# Patient Record
Sex: Male | Born: 1960
Health system: Southern US, Community
[De-identification: ages and names within clinical notes are randomized; demographics above are authoritative.]

## PROBLEM LIST (undated history)

## (undated) DIAGNOSIS — S46001A Unspecified injury of muscle(s) and tendon(s) of the rotator cuff of right shoulder, initial encounter: Secondary | ICD-10-CM

## (undated) DIAGNOSIS — R109 Unspecified abdominal pain: Secondary | ICD-10-CM

## (undated) DIAGNOSIS — M1990 Unspecified osteoarthritis, unspecified site: Secondary | ICD-10-CM

## (undated) DIAGNOSIS — B019 Varicella without complication: Secondary | ICD-10-CM

## (undated) DIAGNOSIS — J4520 Mild intermittent asthma, uncomplicated: Secondary | ICD-10-CM

## (undated) DIAGNOSIS — B269 Mumps without complication: Secondary | ICD-10-CM

## (undated) DIAGNOSIS — E109 Type 1 diabetes mellitus without complications: Secondary | ICD-10-CM

## (undated) DIAGNOSIS — M25569 Pain in unspecified knee: Secondary | ICD-10-CM

## (undated) DIAGNOSIS — I10 Essential (primary) hypertension: Secondary | ICD-10-CM

## (undated) DIAGNOSIS — K5900 Constipation, unspecified: Secondary | ICD-10-CM

## (undated) DIAGNOSIS — E785 Hyperlipidemia, unspecified: Secondary | ICD-10-CM

## (undated) DIAGNOSIS — Z0000 Encounter for general adult medical examination without abnormal findings: Secondary | ICD-10-CM

## (undated) DIAGNOSIS — L578 Other skin changes due to chronic exposure to nonionizing radiation: Secondary | ICD-10-CM

## (undated) DIAGNOSIS — D649 Anemia, unspecified: Secondary | ICD-10-CM

## (undated) DIAGNOSIS — E1039 Type 1 diabetes mellitus with other diabetic ophthalmic complication: Secondary | ICD-10-CM

## (undated) HISTORY — DX: Type 1 diabetes mellitus without complications: E10.9

## (undated) HISTORY — DX: Pain in unspecified knee: M25.569

## (undated) HISTORY — DX: Unspecified abdominal pain: R10.9

## (undated) HISTORY — DX: Hyperlipidemia, unspecified: E78.5

## (undated) HISTORY — DX: Encounter for general adult medical examination without abnormal findings: Z00.00

## (undated) HISTORY — DX: Essential (primary) hypertension: I10

## (undated) HISTORY — DX: Constipation, unspecified: K59.00

## (undated) HISTORY — DX: Unspecified injury of muscle(s) and tendon(s) of the rotator cuff of right shoulder, initial encounter: S46.001A

## (undated) HISTORY — DX: Mild intermittent asthma, uncomplicated: J45.20

## (undated) HISTORY — DX: Type 1 diabetes mellitus with other diabetic ophthalmic complication: E10.39

## (undated) HISTORY — DX: Mumps without complication: B26.9

## (undated) HISTORY — DX: Other skin changes due to chronic exposure to nonionizing radiation: L57.8

## (undated) HISTORY — DX: Anemia, unspecified: D64.9

## (undated) HISTORY — DX: Varicella without complication: B01.9

## (undated) HISTORY — DX: Unspecified osteoarthritis, unspecified site: M19.90

## (undated) HISTORY — PX: OTHER SURGICAL HISTORY: SHX169

---

## 1999-04-12 ENCOUNTER — Encounter: Payer: Self-pay | Admitting: Emergency Medicine

## 1999-04-12 ENCOUNTER — Emergency Department (HOSPITAL_COMMUNITY): Admission: EM | Admit: 1999-04-12 | Discharge: 1999-04-12 | Payer: Self-pay | Admitting: Emergency Medicine

## 1999-10-19 ENCOUNTER — Emergency Department (HOSPITAL_COMMUNITY): Admission: EM | Admit: 1999-10-19 | Discharge: 1999-10-20 | Payer: Self-pay | Admitting: *Deleted

## 1999-10-26 ENCOUNTER — Emergency Department (HOSPITAL_COMMUNITY): Admission: EM | Admit: 1999-10-26 | Discharge: 1999-10-26 | Payer: Self-pay | Admitting: Emergency Medicine

## 1999-12-17 ENCOUNTER — Emergency Department (HOSPITAL_COMMUNITY): Admission: EM | Admit: 1999-12-17 | Discharge: 1999-12-17 | Payer: Self-pay | Admitting: Emergency Medicine

## 2000-07-18 ENCOUNTER — Emergency Department (HOSPITAL_COMMUNITY): Admission: EM | Admit: 2000-07-18 | Discharge: 2000-07-18 | Payer: Self-pay | Admitting: *Deleted

## 2000-08-31 ENCOUNTER — Emergency Department (HOSPITAL_COMMUNITY): Admission: EM | Admit: 2000-08-31 | Discharge: 2000-08-31 | Payer: Self-pay | Admitting: Emergency Medicine

## 2000-10-23 ENCOUNTER — Emergency Department (HOSPITAL_COMMUNITY): Admission: EM | Admit: 2000-10-23 | Discharge: 2000-10-23 | Payer: Self-pay | Admitting: *Deleted

## 2001-03-25 ENCOUNTER — Emergency Department (HOSPITAL_COMMUNITY): Admission: EM | Admit: 2001-03-25 | Discharge: 2001-03-25 | Payer: Self-pay | Admitting: Emergency Medicine

## 2002-04-06 HISTORY — PX: ROTATOR CUFF REPAIR: SHX139

## 2002-07-04 ENCOUNTER — Encounter: Payer: Self-pay | Admitting: Emergency Medicine

## 2002-07-04 ENCOUNTER — Emergency Department (HOSPITAL_COMMUNITY): Admission: EM | Admit: 2002-07-04 | Discharge: 2002-07-04 | Payer: Self-pay | Admitting: Emergency Medicine

## 2003-06-29 ENCOUNTER — Emergency Department (HOSPITAL_COMMUNITY): Admission: EM | Admit: 2003-06-29 | Discharge: 2003-06-29 | Payer: Self-pay | Admitting: *Deleted

## 2005-04-06 HISTORY — PX: REFRACTIVE SURGERY: SHX103

## 2005-06-10 ENCOUNTER — Emergency Department (HOSPITAL_COMMUNITY): Admission: EM | Admit: 2005-06-10 | Discharge: 2005-06-10 | Payer: Self-pay | Admitting: Emergency Medicine

## 2006-06-14 ENCOUNTER — Emergency Department (HOSPITAL_COMMUNITY): Admission: EM | Admit: 2006-06-14 | Discharge: 2006-06-15 | Payer: Self-pay | Admitting: Emergency Medicine

## 2008-07-02 ENCOUNTER — Ambulatory Visit: Payer: Self-pay | Admitting: Internal Medicine

## 2008-07-02 DIAGNOSIS — E785 Hyperlipidemia, unspecified: Secondary | ICD-10-CM

## 2008-07-02 DIAGNOSIS — I10 Essential (primary) hypertension: Secondary | ICD-10-CM

## 2008-07-02 DIAGNOSIS — J309 Allergic rhinitis, unspecified: Secondary | ICD-10-CM | POA: Insufficient documentation

## 2008-09-10 ENCOUNTER — Encounter: Payer: Self-pay | Admitting: Internal Medicine

## 2008-09-10 LAB — CONVERTED CEMR LAB
ALT: 24 units/L
AST: 19 units/L
Creatinine, Ser: 0.9 mg/dL
Direct LDL: 71 mg/dL
Glucose, Urine, Semiquant: 64
HDL: 73 mg/dL
Hgb A1c MFr Bld: 6.7 %
Triglyceride fasting, serum: 51 mg/dL

## 2008-09-17 ENCOUNTER — Ambulatory Visit: Payer: Self-pay | Admitting: Internal Medicine

## 2008-09-17 DIAGNOSIS — F4322 Adjustment disorder with anxiety: Secondary | ICD-10-CM | POA: Insufficient documentation

## 2008-09-17 DIAGNOSIS — F418 Other specified anxiety disorders: Secondary | ICD-10-CM | POA: Insufficient documentation

## 2008-10-01 ENCOUNTER — Ambulatory Visit: Payer: Self-pay | Admitting: Internal Medicine

## 2008-10-01 DIAGNOSIS — L259 Unspecified contact dermatitis, unspecified cause: Secondary | ICD-10-CM

## 2008-10-29 ENCOUNTER — Ambulatory Visit: Payer: Self-pay | Admitting: Internal Medicine

## 2009-01-07 ENCOUNTER — Encounter: Payer: Self-pay | Admitting: Internal Medicine

## 2009-02-25 ENCOUNTER — Ambulatory Visit: Payer: Self-pay | Admitting: Internal Medicine

## 2009-05-06 ENCOUNTER — Encounter: Payer: Self-pay | Admitting: Internal Medicine

## 2009-08-12 ENCOUNTER — Encounter: Payer: Self-pay | Admitting: Internal Medicine

## 2009-11-25 ENCOUNTER — Encounter: Payer: Self-pay | Admitting: Internal Medicine

## 2009-12-26 ENCOUNTER — Inpatient Hospital Stay (HOSPITAL_COMMUNITY): Admission: EM | Admit: 2009-12-26 | Discharge: 2009-12-27 | Payer: Self-pay | Admitting: Emergency Medicine

## 2009-12-26 ENCOUNTER — Encounter (INDEPENDENT_AMBULATORY_CARE_PROVIDER_SITE_OTHER): Payer: Self-pay

## 2009-12-31 ENCOUNTER — Ambulatory Visit: Payer: Self-pay | Admitting: Internal Medicine

## 2009-12-31 DIAGNOSIS — K3580 Unspecified acute appendicitis: Secondary | ICD-10-CM | POA: Insufficient documentation

## 2010-02-24 ENCOUNTER — Encounter: Payer: Self-pay | Admitting: Internal Medicine

## 2010-04-16 ENCOUNTER — Encounter (INDEPENDENT_AMBULATORY_CARE_PROVIDER_SITE_OTHER): Payer: Self-pay | Admitting: *Deleted

## 2010-04-16 ENCOUNTER — Ambulatory Visit
Admission: RE | Admit: 2010-04-16 | Discharge: 2010-04-16 | Payer: Self-pay | Source: Home / Self Care | Attending: Family Medicine | Admitting: Family Medicine

## 2010-04-16 DIAGNOSIS — J069 Acute upper respiratory infection, unspecified: Secondary | ICD-10-CM | POA: Insufficient documentation

## 2010-05-06 NOTE — Assessment & Plan Note (Signed)
Summary: hospital fu appendicitis surgery/dt   Vital Signs:  Patient profile:   50 year old male Height:      67.5 inches Weight:      165 pounds BMI:     25.55 O2 Sat:      99 % on Room air Temp:     98.0 degrees F oral Pulse rate:   69 / minute Pulse rhythm:   regular Resp:     18 per minute BP sitting:   122 / 80  (right arm) Cuff size:   large  Vitals Entered By: Glendell Docker CMA (December 31, 2009 2:42 PM)  O2 Flow:  Room air CC: Hospital Follow up  Is Patient Diabetic? Yes Did you bring your meter with you today? Yes Pain Assessment Patient in pain? no      Comments Hospitalized last week Thursday and discharged on Friday, for appendicitis. Request evaluation of incision site, patient denies pain   Primary Care Provider:  D. Thomos Lemons DO  CC:  Hospital Follow up .  History of Present Illness: 50 y/o white male with hx of DM I for hosp f/u pt recently admitted for right appy appy performed laporascopically  eating and drinking back to normal  blood sugars have been higher using higher basal rate  no fever or chills   Preventive Screening-Counseling & Management  Alcohol-Tobacco     Smoking Status: never  Allergies: 1)  ! Codeine  Past History:  Past Medical History: Diabetes mellitus, type I Hyperlipidemia   Hypertension    Past Surgical History: Laser eye surgery 2007 (Eye center at [facility])     Social History: Occupation: Time Physiological scientist (service tech) Married 14 years 2 daughters age 73 & 48  1 son 42 Never Smoked  Alcohol use-yes (social)     Physical Exam  General:  alert, well-developed, and well-nourished.   Lungs:  normal respiratory effort and normal breath sounds.   Heart:  normal rate, regular rhythm, and no gallop.   Abdomen:  soft, non-tender, and normal bowel sounds.  dressings CDI Extremities:  No lower extremity edema  Neurologic:  cranial nerves II-XII intact and gait normal.     Impression &  Recommendations:  Problem # 1:  ACUTE APPENDICITIS WITHOUT MENTION PERITONITIS (ICD-540.9) s/p lap appy no complications f/u with surgeon as directed  Problem # 2:  HYPERTENSION (ICD-401.9) Assessment: Unchanged  His updated medication list for this problem includes:    Quinapril Hcl 40 Mg Tabs (Quinapril hcl) .Marland Kitchen... Take 1 tablet by mouth once a day  BP today: 122/80 Prior BP: 110/70 (10/29/2008)  Labs Reviewed: Creat: : 0.9 (09/10/2008)   HDL: 73 (09/10/2008)   TG: 51 (09/10/2008)  Problem # 3:  DIABETES MELLITUS, TYPE I (ICD-250.01) Insulin pump mgt as per endocrinologist  His updated medication list for this problem includes:    Quinapril Hcl 40 Mg Tabs (Quinapril hcl) .Marland Kitchen... Take 1 tablet by mouth once a day  Labs Reviewed: Creat: 0.9 (09/10/2008)    Reviewed HgBA1c results: 6.7 (09/10/2008)  Complete Medication List: 1)  Crestor 40 Mg Tabs (Rosuvastatin calcium) .... Take 1 tablet by mouth once a day 2)  Quinapril Hcl 40 Mg Tabs (Quinapril hcl) .... Take 1 tablet by mouth once a day 3)  Onetouch Ping Insulin Pump Kit (Insulin infusion pump) .... Use 60 units once daily  Patient Instructions: 1)  Please schedule a follow-up appointment in 1 year.  Current Allergies (reviewed today): ! CODEINE   Immunization  History:  Influenza Immunization History:    Influenza:  historical (12/27/2009)

## 2010-05-06 NOTE — Letter (Signed)
Summary: [facility] Endocrinology & Diabetes  [facility] Endocrinology & Diabetes   Imported By: Lanelle Bal 12/06/2009 10:26:57  _____________________________________________________________________  External Attachment:    Type:   Image     Comment:   External Document

## 2010-05-06 NOTE — Letter (Signed)
Summary: [facility] Endocrinology & Diabetes  [facility] Endocrinology & Diabetes   Imported By: Lanelle Bal 03/13/2010 16:13:19  _____________________________________________________________________  External Attachment:    Type:   Image     Comment:   External Document

## 2010-05-06 NOTE — Letter (Signed)
Summary: [facility] Endocrinology & Diabetes  [facility] Endocrinology & Diabetes   Imported By: Lanelle Bal 08/21/2009 13:16:03  _____________________________________________________________________  External Attachment:    Type:   Image     Comment:   External Document

## 2010-05-06 NOTE — Letter (Signed)
Summary: [facility] Endocrinology & Diabetes  [facility] Endocrinology & Diabetes   Imported By: Lanelle Bal 05/13/2009 12:47:14  _____________________________________________________________________  External Attachment:    Type:   Image     Comment:   External Document

## 2010-05-08 NOTE — Letter (Signed)
Summary: Out of Work 04/15/10-04/17/10  [place] [facility] [address]   [place], [place] [address]   Phone: [phone]  Fax: [phone]    April 16, 2010   Employee:  KRISHAY FARO    To Whom It May Concern:   For Medical reasons, please excuse the above named employee from work for the following dates:  Start:     April 15, 2010  End:     April 17, 2010  Return to Work:   April 18, 2010  If you need additional information, please feel free to contact our office.       Sincerely,    Francee Piccolo CMA (AAMA)

## 2010-05-08 NOTE — Assessment & Plan Note (Signed)
Summary: SINUS INFECTION/MHF OK PER DIANE   Vital Signs:  Patient profile:   50 year old male Height:      67.5 inches Weight:      173 pounds BMI:     26.79 O2 Sat:      99 % on Room air Temp:     98.1 degrees F oral Pulse rate:   86 / minute BP sitting:   156 / 89  (right arm) Cuff size:   regular  Vitals Entered By: Francee Piccolo CMA Duncan Dull) (April 16, 2010 10:22 AM)  O2 Flow:  Room air CC: sinus pressure, clear nasal discharge, bilateral ear aches since yesterday.  Also states roof of mouth aches, denies headache, URI symptoms Is Patient Diabetic? Yes Did you bring your meter with you today? Yes   History of Present Illness:       This is a 50 year old man who presents with URI symptoms that started about 24-36 hours ago.  The patient complains of nasal congestion, clear nasal discharge with mild PND coughing, and earache, but denies sore throat or body aches.  Describes sinus pressure.  The patient denies fever, stiff neck, dyspnea, and wheezing.  No rash, no vomiting, no fever.   His glucoses have been up a little over his normal and he is titrating his insulin pump accordingly. He has had his seasonal flu vaccine this year.  Current Medications (verified): 1)  Crestor 40 Mg Tabs (Rosuvastatin Calcium) .... Take 1 Tablet By Mouth Once A Day 2)  Quinapril Hcl 40 Mg Tabs (Quinapril Hcl) .... Take 1 Tablet By Mouth Once A Day 3)  Onetouch Ping Insulin Pump  Kit (Insulin Infusion Pump) .... Use 60 Units Once Daily 4)  Insulin Pump .... Use As Directed  Allergies (verified): No Known Drug Allergies PMH-FH-SH reviewed-no changes except otherwise noted  Review of Systems       see hpi  Physical Exam  General:  Gen: alert, NAD, NONTOXIC. HEENT: eyes without injection, drainage, or swelling.  Ears: EACs clear, TMs with normal light reflex and landmarks.  Nose: some dried, crusty exudate adherent to some mildly injected mucosa.  No purulent d/c.  No paranasal sinus  TTP.  No facial swelling.  Throat and mouth without focal lesion.  No pharyngial swelling, erythema, or exudate.   Neck: supple, no LAD.  LUNGS: CTA bilat, nonlabored resps.  CV: RRR, no m/r/g.     Impression & Recommendations:  Problem # 1:  VIRAL URI (ICD-465.9) Assessment New Discussed self limited nature of illness, no need for abx, focus on symptom management. He describes no response to nonsedating antihistamines in the past, no response to the sudafed he took yesterday (except insomnia and possible bp elevation...).  His worst symptom is sinus pressure/ear pressure with nasal/head congestion, so we ultimately decided on a trial of hydrocodone/antihist + tylenol OTC as needed, push fluids, rest, and I gave him a note to stay out of work again tomorrow.  His updated medication list for this problem includes:    Hydrocodone-homatropine 5-1.5 Mg Tabs (Hydrocodone-homatropine) .Marland Kitchen... 1 tsp by mouth q6h as needed for sinus pressure and congestion  Complete Medication List: 1)  Crestor 40 Mg Tabs (Rosuvastatin calcium) .... Take 1 tablet by mouth once a day 2)  Quinapril Hcl 40 Mg Tabs (Quinapril hcl) .... Take 1 tablet by mouth once a day 3)  Onetouch Ping Insulin Pump Kit (Insulin infusion pump) .... Use 60 units once daily 4)  Insulin Pump  .Marland KitchenMarland KitchenMarland Kitchen  Use as directed 5)  Hydrocodone-homatropine 5-1.5 Mg Tabs (Hydrocodone-homatropine) .Marland Kitchen.. 1 tsp by mouth q6h as needed for sinus pressure and congestion  Patient Instructions: 1)  Call or return if your cold symptoms are not improving after 7-10 days.   Prescriptions: HYDROCODONE-HOMATROPINE 5-1.5 MG TABS (HYDROCODONE-HOMATROPINE) 1 tsp by mouth q6h as needed for sinus pressure and congestion  #4 oz x 0   Entered and Authorized by:   Michell Heinrich M.D.   Signed by:   Michell Heinrich M.D. on 04/16/2010   Method used:   Print then Give to Patient   RxID:   [id number]    Orders Added: 1)  Est. Patient Level III  [14782]

## 2010-05-26 ENCOUNTER — Encounter: Payer: Self-pay | Admitting: Internal Medicine

## 2010-06-03 NOTE — Letter (Signed)
Summary: [facility] Endocrinology & Diabetes  [facility] Endocrinology & Diabetes   Imported By: Maryln Gottron 05/29/2010 15:53:40  _____________________________________________________________________  External Attachment:    Type:   Image     Comment:   External Document

## 2010-06-19 LAB — URINALYSIS, ROUTINE W REFLEX MICROSCOPIC
Ketones, ur: NEGATIVE mg/dL
Leukocytes, UA: NEGATIVE
Nitrite: NEGATIVE
Protein, ur: NEGATIVE mg/dL

## 2010-06-19 LAB — GLUCOSE, CAPILLARY
Glucose-Capillary: 223 mg/dL — ABNORMAL HIGH (ref 70–99)
Glucose-Capillary: 254 mg/dL — ABNORMAL HIGH (ref 70–99)
Glucose-Capillary: 284 mg/dL — ABNORMAL HIGH (ref 70–99)
Glucose-Capillary: 297 mg/dL — ABNORMAL HIGH (ref 70–99)

## 2010-06-19 LAB — COMPREHENSIVE METABOLIC PANEL
ALT: 27 U/L (ref 0–53)
Alkaline Phosphatase: 63 U/L (ref 39–117)
BUN: 19 mg/dL (ref 6–23)
CO2: 30 mEq/L (ref 19–32)
Calcium: 9.2 mg/dL (ref 8.4–10.5)
GFR calc non Af Amer: >60 mL/min (ref >60)
Glucose, Bld: 263 mg/dL — ABNORMAL HIGH (ref 70–99)
Potassium: 4.8 mEq/L (ref 3.5–5.1)
Sodium: 136 mEq/L (ref 135–145)
Total Protein: 6.9 g/dL (ref 6.0–8.3)

## 2010-06-19 LAB — CBC
HCT: 42.8 % (ref 39.0–52.0)
Hemoglobin: 14 g/dL (ref 13.0–17.0)
MCH: 29.5 pg (ref 26.0–34.0)
MCHC: 32.7 g/dL (ref 30.0–36.0)
MCHC: 33.1 g/dL (ref 30.0–36.0)
MCV: 89.2 fL (ref 78.0–100.0)
MCV: 90.5 fL (ref 78.0–100.0)
Platelets: 150 10*3/uL (ref 150–400)
RDW: 11.5 % (ref 11.5–15.5)
RDW: 11.7 % (ref 11.5–15.5)

## 2010-06-19 LAB — DIFFERENTIAL
Basophils Relative: 0 % (ref 0–1)
Eosinophils Absolute: 0.1 10*3/uL (ref 0.0–0.7)
Monocytes Relative: 6 % (ref 3–12)
Neutro Abs: 9 10*3/uL — ABNORMAL HIGH (ref 1.7–7.7)
Neutrophils Relative %: 80 % — ABNORMAL HIGH (ref 43–77)

## 2010-06-19 LAB — URINE MICROSCOPIC-ADD ON

## 2011-11-13 ENCOUNTER — Ambulatory Visit (INDEPENDENT_AMBULATORY_CARE_PROVIDER_SITE_OTHER): Payer: Managed Care, Other (non HMO) | Admitting: Internal Medicine

## 2011-11-13 ENCOUNTER — Encounter: Payer: Self-pay | Admitting: Internal Medicine

## 2011-11-13 VITALS — BP 128/68 | HR 66 | Temp 98.4°F | Resp 16 | Ht 68.0 in | Wt 167.0 lb

## 2011-11-13 DIAGNOSIS — E785 Hyperlipidemia, unspecified: Secondary | ICD-10-CM

## 2011-11-13 DIAGNOSIS — Z1211 Encounter for screening for malignant neoplasm of colon: Secondary | ICD-10-CM

## 2011-11-13 MED ORDER — ATORVASTATIN CALCIUM 40 MG PO TABS: 40.0000 mg | ORAL_TABLET | Freq: Every day | ORAL | Status: DC

## 2011-11-13 NOTE — Patient Instructions (Signed)
Please schedule fasting labs in 4-6 weeks Lipid/lft-272.4 and PSA(prostate cancer screening)

## 2011-11-14 DIAGNOSIS — Z1211 Encounter for screening for malignant neoplasm of colon: Secondary | ICD-10-CM | POA: Insufficient documentation

## 2011-11-14 NOTE — Assessment & Plan Note (Signed)
Refer for screening colonoscopy 

## 2011-11-14 NOTE — Progress Notes (Signed)
  Subjective:    Patient ID: Scott Adkins, male    DOB: 18-Aug-1960, 51 y.o.   MRN: [id number]  HPI Pt presents to clinic for followup of multiple medical problems. H/o DM type maintained on insulin pump followed by endocrinology. H/o hyperlipidemia but has had to stop crestor due to cost. Tolerated statin therapy without myalgia or abn lft. Has not undergone screening colonoscopy.  Past Medical History  Diagnosis Date  . Diabetes mellitus type I   . Hyperlipidemia   . Hypertension    Past Surgical History  Procedure Date  . Refractive surgery 2007    [facility]    reports that he has never smoked. He does not have any smokeless tobacco history on file. He reports that he drinks alcohol. His drug history not on file. family history includes Hypertension in an unspecified family member and Stroke in an unspecified family member.  There is no history of Colon cancer, and Prostate cancer, and Breast cancer, . Allergies  Allergen Reactions  . Codeine     REACTION: Heart Races      Review of Systems see hpi     Objective:   Physical Exam  Physical Exam  Nursing note and vitals reviewed. Constitutional: Appears well-developed and well-nourished. No distress.  HENT:  Head: Normocephalic and atraumatic.  Right Ear: External ear normal.  Left Ear: External ear normal.  Eyes: Conjunctivae are normal. No scleral icterus.  Neck: Neck supple. Carotid bruit is not present.  Cardiovascular: Normal rate, regular rhythm and normal heart sounds.  Exam reveals no gallop and no friction rub.   No murmur heard. Pulmonary/Chest: Effort normal and breath sounds normal. No respiratory distress. He has no wheezes. no rales.  Lymphadenopathy:    He has no cervical adenopathy.  Neurological:Alert.  Skin: Skin is warm and dry. Not diaphoretic.  Psychiatric: Has a normal mood and affect.        Assessment & Plan:

## 2011-11-14 NOTE — Assessment & Plan Note (Signed)
Attempt lipitor 40mg  po qd and obtain lipid/lft in 4-6wks

## 2011-11-16 ENCOUNTER — Encounter: Payer: Self-pay | Admitting: Gastroenterology

## 2011-12-11 ENCOUNTER — Ambulatory Visit (AMBULATORY_SURGERY_CENTER): Payer: Managed Care, Other (non HMO) | Admitting: *Deleted

## 2011-12-11 ENCOUNTER — Telehealth: Payer: Self-pay | Admitting: *Deleted

## 2011-12-11 VITALS — Ht 67.5 in | Wt 172.0 lb

## 2011-12-11 DIAGNOSIS — Z1211 Encounter for screening for malignant neoplasm of colon: Secondary | ICD-10-CM

## 2011-12-11 MED ORDER — MOVIPREP 100 G PO SOLR: ORAL | Status: DC

## 2011-12-11 NOTE — Telephone Encounter (Signed)
Scott Adkins: Pt is insulin dependent diabetic on insulin pump.  Dr. Vale Haven Altheimer is pt's endocrinologist.  Office number is: [phone].  FAX is: [phone].  Please send letter to MD for instructions for insulin pump day before procedure and day of procedure.  Best number to reach pt with instructions is: [phone].  Colonoscopy scheduled for 9/20 at 9:30 with Dr. Juanda Chance. Thanks

## 2011-12-11 NOTE — Telephone Encounter (Signed)
Letter sent to Dr Altheimer. 

## 2011-12-14 LAB — LIPID PANEL
Cholesterol: 140 mg/dL (ref 0–200)
LDL Cholesterol: 72 mg/dL (ref 0–99)
Total CHOL/HDL Ratio: 2.4 Ratio
VLDL: 10 mg/dL (ref 0–40)

## 2011-12-14 LAB — PSA: PSA: 0.51 ng/mL (ref <=4.00)

## 2011-12-14 LAB — HEPATIC FUNCTION PANEL
ALT: 13 U/L (ref 0–53)
Bilirubin, Direct: 0.1 mg/dL (ref 0.0–0.3)
Indirect Bilirubin: 0.3 mg/dL (ref 0.0–0.9)
Total Bilirubin: 0.4 mg/dL (ref 0.3–1.2)

## 2011-12-14 NOTE — Addendum Note (Signed)
Addended by: Regis Bill on: 12/14/2011 09:21 AM   Modules accepted: Orders

## 2011-12-14 NOTE — Telephone Encounter (Signed)
Spoke with patient and gave him Dr Altheimer's recommendations regarding insulin pump for procedure. Patient verbalizes understanding. Please see 12/11/11 note under "media" that was sent back by Dr Altheimer regarding insulin pump.

## 2011-12-15 ENCOUNTER — Encounter: Payer: Self-pay | Admitting: Internal Medicine

## 2011-12-23 ENCOUNTER — Encounter: Payer: Managed Care, Other (non HMO) | Admitting: Gastroenterology

## 2011-12-25 ENCOUNTER — Ambulatory Visit (AMBULATORY_SURGERY_CENTER): Payer: Managed Care, Other (non HMO) | Admitting: Internal Medicine

## 2011-12-25 ENCOUNTER — Encounter: Payer: Managed Care, Other (non HMO) | Admitting: Gastroenterology

## 2011-12-25 ENCOUNTER — Encounter: Payer: Self-pay | Admitting: Internal Medicine

## 2011-12-25 VITALS — BP 138/79 | HR 66 | Temp 97.3°F | Resp 17 | Ht 67.0 in | Wt 172.0 lb

## 2011-12-25 DIAGNOSIS — K6389 Other specified diseases of intestine: Secondary | ICD-10-CM

## 2011-12-25 DIAGNOSIS — Z1211 Encounter for screening for malignant neoplasm of colon: Secondary | ICD-10-CM

## 2011-12-25 LAB — GLUCOSE, CAPILLARY: Glucose-Capillary: 186 mg/dL — ABNORMAL HIGH (ref 70–99)

## 2011-12-25 MED ORDER — SODIUM CHLORIDE 0.9 % IV SOLN: 500.0000 mL | INTRAVENOUS | Status: DC

## 2011-12-25 NOTE — Op Note (Signed)
[facility] [address] [place], [address]   COLONOSCOPY PROCEDURE REPORT  PATIENT: [place], Souter  MR#: [id number] BIRTHDATE: [birth year] , 51  yrs. old GENDER: Male ENDOSCOPIST: Hart Carwin, MD REFERRED BY:  Charlynn Court, M.D. PROCEDURE DATE:  12/25/2011 PROCEDURE:   Colonoscopy with biopsy ASA CLASS:   Class III INDICATIONS:average risk patient for colon cancer. MEDICATIONS: MAC sedation, administered by CRNA and Propofol (Diprivan) 200 mg IV  DESCRIPTION OF PROCEDURE:   After the risks and benefits and of the procedure were explained, informed consent was obtained.  A digital rectal exam revealed no abnormalities of the rectum.    The LB [id number]  endoscope was introduced through the anus and advanced to the cecum, which was identified by both the appendix and ileocecal valve .  The quality of the prep was good, using MoviPrep .  The instrument was then slowly withdrawn as the colon was fully examined.     COLON FINDINGS: Mild melanosis was found. biopsies taken - lymphoid follicles vs polyps    Retroflexed views revealed no abnormalities. The scope was then withdrawn from the patient and the procedure completed.  COMPLICATIONS: There were no complications. ENDOSCOPIC IMPRESSION: Mild melanosis was found lymphoid follicles vs diminutive polyps RECOMMENDATIONS: 1.  await pathology results 2.  High fiber diet 3.  continue Insulin pump 4.  continue Insulin pump   REPEAT EXAM: In 10 year(s)  for Colonoscopy.  [id number] Altheimer, MD  _______________________________ eSigned:  Hart Carwin, MD 12/25/2011 10:09 AM     PATIENT NAME:  Jeet, Shough MR#: [id number]

## 2011-12-25 NOTE — Progress Notes (Signed)
Patient did not experience any of the following events: a burn prior to discharge; a fall within the facility; wrong site/side/patient/procedure/implant event; or a hospital transfer or hospital admission upon discharge from the facility. (G8907) Patient did not have preoperative order for IV antibiotic SSI prophylaxis. (G8918)  

## 2011-12-25 NOTE — Patient Instructions (Addendum)

## 2011-12-25 NOTE — Progress Notes (Addendum)
NOTE FROM ENDOCRINOLOGIST ON PAPER CHART.  PATIENT STATING AT 0500 BS WAS 54. PATIENT DRANK APPLE JUICE, RECHECKED AT 0700 170. ON ADMISSION BLOOD SUGAR WAS 186. REPORTED ABOVE TO CRNA AND DR. Juanda Chance, WILL CONTINUE INSULIN PUMP AT PRESENT RATE. PATIENT INFORMED AND AGREES WITH PLAN.PRESENT RATE 55/HOUR, NORMAL RATE.

## 2011-12-28 ENCOUNTER — Telehealth: Payer: Self-pay | Admitting: *Deleted

## 2011-12-28 NOTE — Telephone Encounter (Signed)
  Follow up Call-  Call back number 12/25/2011  Post procedure Call Back phone  # [phone]  Permission to leave phone message Yes     Patient questions:  Do you have a fever, pain , or abdominal swelling? no Pain Score  0 *  Have you tolerated food without any problems? yes  Have you been able to return to your normal activities? yes  Do you have any questions about your discharge instructions: Diet   no Medications  no Follow up visit  no  Do you have questions or concerns about your Care? no  Actions: * If pain score is 4 or above: No action needed, pain <4.

## 2011-12-29 ENCOUNTER — Encounter: Payer: Self-pay | Admitting: Internal Medicine

## 2012-02-15 ENCOUNTER — Emergency Department (HOSPITAL_BASED_OUTPATIENT_CLINIC_OR_DEPARTMENT_OTHER)
Admission: EM | Admit: 2012-02-15 | Discharge: 2012-02-16 | Disposition: A | Discharge status: Home / Self Care | Payer: Worker's Compensation | Attending: Emergency Medicine | Admitting: Emergency Medicine

## 2012-02-15 ENCOUNTER — Encounter (HOSPITAL_BASED_OUTPATIENT_CLINIC_OR_DEPARTMENT_OTHER): Payer: Self-pay | Admitting: Emergency Medicine

## 2012-02-15 ENCOUNTER — Emergency Department (HOSPITAL_BASED_OUTPATIENT_CLINIC_OR_DEPARTMENT_OTHER): Payer: Managed Care, Other (non HMO) | Attending: Emergency Medicine

## 2012-02-15 DIAGNOSIS — E109 Type 1 diabetes mellitus without complications: Secondary | ICD-10-CM | POA: Insufficient documentation

## 2012-02-15 DIAGNOSIS — S46909A Unspecified injury of unspecified muscle, fascia and tendon at shoulder and upper arm level, unspecified arm, initial encounter: Secondary | ICD-10-CM | POA: Insufficient documentation

## 2012-02-15 DIAGNOSIS — Z7982 Long term (current) use of aspirin: Secondary | ICD-10-CM | POA: Insufficient documentation

## 2012-02-15 DIAGNOSIS — S4980XA Other specified injuries of shoulder and upper arm, unspecified arm, initial encounter: Secondary | ICD-10-CM | POA: Insufficient documentation

## 2012-02-15 DIAGNOSIS — S4991XA Unspecified injury of right shoulder and upper arm, initial encounter: Secondary | ICD-10-CM

## 2012-02-15 DIAGNOSIS — Z79899 Other long term (current) drug therapy: Secondary | ICD-10-CM | POA: Insufficient documentation

## 2012-02-15 DIAGNOSIS — M19019 Primary osteoarthritis, unspecified shoulder: Secondary | ICD-10-CM | POA: Insufficient documentation

## 2012-02-15 DIAGNOSIS — I10 Essential (primary) hypertension: Secondary | ICD-10-CM | POA: Insufficient documentation

## 2012-02-15 DIAGNOSIS — Y939 Activity, unspecified: Secondary | ICD-10-CM | POA: Insufficient documentation

## 2012-02-15 DIAGNOSIS — M129 Arthropathy, unspecified: Secondary | ICD-10-CM | POA: Insufficient documentation

## 2012-02-15 DIAGNOSIS — X58XXXA Exposure to other specified factors, initial encounter: Secondary | ICD-10-CM | POA: Insufficient documentation

## 2012-02-15 DIAGNOSIS — E785 Hyperlipidemia, unspecified: Secondary | ICD-10-CM | POA: Insufficient documentation

## 2012-02-15 DIAGNOSIS — Y929 Unspecified place or not applicable: Secondary | ICD-10-CM | POA: Insufficient documentation

## 2012-02-15 NOTE — ED Notes (Signed)
Pt c/o injury to right shoulder while moving wood at work.

## 2012-02-15 NOTE — ED Notes (Signed)
Workers comp. uds completed.

## 2012-02-15 NOTE — ED Notes (Signed)
Pt. Reports the R shoulder injury happened at approx. 2015 tonight.

## 2012-02-16 NOTE — ED Notes (Signed)
MD at bedside. 

## 2012-02-16 NOTE — ED Provider Notes (Signed)
History  This chart was scribed for Tobin Chad, MD by Ardeen Jourdain, ED Scribe. This patient was seen in room MH11/MH11 and the patient's care was started at 2359.  CSN: [id number]  Arrival date & time 02/15/12  2053   First MD Initiated Contact with Patient 02/15/12 2359      Chief Complaint  Patient presents with  . Shoulder Injury    The history is provided by the patient. No language interpreter was used.    Scott Adkins is a 51 y.o. male who presents to the Emergency Department complaining of a shoulder injury earlier this evening. He states that he was moving lumbar when he felt his shoulder give out. He denies any other injuries or illness at this time. He states he has a h/o similar symptoms in the right shoulder, but none in the left. He has a h/o DM, HTN and arthritis. He is an occasional alcohol user but denies smoking.  Past Medical History  Diagnosis Date  . Hyperlipidemia   . Diabetes mellitus type I     uses insulin pump// managed by Dr. Casimiro Needle Altheimer  . Hypertension   . Arthritis     Past Surgical History  Procedure Date  . Refractive surgery 2007    [facility]  . Rotator cuff repair 2004    left    Family History  Problem Relation Age of Onset  . Hypertension    . Stroke    . Colon cancer Neg Hx   . Prostate cancer Neg Hx   . Breast cancer Neg Hx   . Rectal cancer Neg Hx     History  Substance Use Topics  . Smoking status: Never Smoker   . Smokeless tobacco: Never Used  . Alcohol Use: 0.6 oz/week    1 Shots of liquor per week     Comment: Socially      Review of Systems  Constitutional: Negative for fever and chills.  Respiratory: Negative for shortness of breath.   Gastrointestinal: Negative for nausea and vomiting.  Musculoskeletal:       Shoulder injury  Neurological: Negative for weakness.  All other systems reviewed and are negative.    Allergies  Review of patient's allergies indicates no known  allergies.  Home Medications   Current Outpatient Rx  Name  Route  Sig  Dispense  Refill  . ASPIRIN 81 MG PO TABS   Oral   Take 81 mg by mouth daily.         . ATORVASTATIN CALCIUM 40 MG PO TABS   Oral   Take 1 tablet (40 mg total) by mouth daily.   30 tablet   6   . VITAMIN D3 1000 UNITS PO CAPS   Oral   Take 2,000 Units by mouth daily.         Marland Kitchen GLUCOSE BLOOD VI STRP   Other   1 each by Other route as needed. Use as instructed         . ACCU-CHEK MULTICLIX LANCETS MISC   Other   1 each by Other route as needed.         Marland Kitchen NOVOLOG 100 UNIT/ML [place] SOLN      Novolog insulin per insulin pump         . QUINAPRIL HCL 40 MG PO TABS   Oral   Take 40 mg by mouth daily.           Triage Vitals: BP 152/80  Pulse 72  Temp 98.7 F (37.1 C) (Oral)  Resp 16  Ht 5\' 7"  (1.702 m)  Wt 170 lb (77.111 kg)  BMI 26.63 kg/m2  SpO2 100%  Physical Exam  Nursing note and vitals reviewed. Constitutional: He is oriented to person, place, and time. He appears well-developed and well-nourished. No distress.  HENT:  Head: Normocephalic and atraumatic.  Eyes: Conjunctivae normal are normal. Pupils are equal, round, and reactive to light. Right eye exhibits no discharge. Left eye exhibits no discharge. No scleral icterus.  Neck: Normal range of motion. Neck supple. No JVD present. No tracheal deviation present.  Cardiovascular: Normal rate, regular rhythm, normal heart sounds and intact distal pulses.  Exam reveals no gallop.   No murmur heard. Pulmonary/Chest: Effort normal and breath sounds normal. No stridor. No respiratory distress. He has no wheezes. He has no rales. He exhibits no tenderness.  Abdominal: Soft. Bowel sounds are normal. He exhibits no distension. There is no tenderness. There is no rebound and no guarding.  Musculoskeletal: He exhibits no edema.       Right shoulder: He exhibits decreased range of motion, tenderness and pain. He exhibits no bony tenderness,  no swelling, no effusion, no crepitus, no deformity, no laceration and no spasm.       Note arm held adducted and internally rotated.  He is able to raise the arm at the shoulder to just above shoulder height before pain prevents him from going further.  He has limited external rotation secondary to pain.  Not no obvious deformity.  Note no specific point tenderness over the [facility] joint or scapula.  Note a nl contour to the shoulder and the humeral head appears to be appropriately placed.  Lymphadenopathy:    He has no cervical adenopathy.  Neurological: He is alert and oriented to person, place, and time. No cranial nerve deficit. He exhibits normal muscle tone.  Skin: Skin is warm and dry. No rash noted. No erythema. No pallor.  Psychiatric: He has a normal mood and affect. His behavior is normal.    ED Course  Procedures (including critical care time)  DIAGNOSTIC STUDIES: Oxygen Saturation is 100% on room air, normal by my interpretation.    COORDINATION OF CARE:  12:05 AM: Discussed treatment plan which includes a sling and bed rest with pt at bedside and pt agreed to plan.    Labs Reviewed - No data to display Dg Shoulder Right  02/15/2012  *RADIOLOGY REPORT*  Clinical Data: Shoulder injury  RIGHT SHOULDER - 2+ VIEW  Comparison: None.  Findings: Mild degenerative change involves the glenohumeral joint. No acute fracture or subluxation.  No radiopaque foreign bodies or soft tissue calcifications.  IMPRESSION:  1.  No acute findings. 2.  Osteoarthritis.   Original Report Authenticated By: Signa Kell, M.D.      No diagnosis found.    MDM  Pt presents for evaluation of shoulder pain.  He is currently stable, NAD.  Note degenerative changes on the xray but no obvious deformity.  Will place him in a sling.  Cannot exclude a ligamentous or muscle injury.  Will refer to a local orthopedic specialist.  Pt will be given a work excuse note for light duty until cleared for normal activities  by his PMD or orthopedic specialist.      I personally performed the services described in this documentation, which was scribed in my presence. The recorded information has been reviewed and is accurate.    Tobin Chad,  MD 02/16/12 9147

## 2012-02-17 ENCOUNTER — Ambulatory Visit (INDEPENDENT_AMBULATORY_CARE_PROVIDER_SITE_OTHER): Payer: Managed Care, Other (non HMO) | Admitting: Internal Medicine

## 2012-02-17 ENCOUNTER — Encounter: Payer: Self-pay | Admitting: Internal Medicine

## 2012-02-17 VITALS — BP 140/82 | HR 65 | Temp 98.4°F | Resp 16 | Wt 171.0 lb

## 2012-02-17 DIAGNOSIS — E785 Hyperlipidemia, unspecified: Secondary | ICD-10-CM

## 2012-02-17 DIAGNOSIS — R202 Paresthesia of skin: Secondary | ICD-10-CM | POA: Insufficient documentation

## 2012-02-17 DIAGNOSIS — R209 Unspecified disturbances of skin sensation: Secondary | ICD-10-CM

## 2012-02-17 MED ORDER — DICLOFENAC SODIUM 75 MG PO TBEC: DELAYED_RELEASE_TABLET | ORAL | Status: DC

## 2012-02-17 NOTE — Progress Notes (Signed)
  Subjective:    Patient ID: Scott Adkins, male    DOB: 1960-11-04, 51 y.o.   MRN: [id number]  HPI patient presents to clinic for followup of multiple medical problems. Recently suffered injury at work of right shoulder now status post ED visit. Is using his sling and is scheduled tentatively for orthopedic evaluation in the near future. Plain x-ray demonstrated only mild degenerative changes. Notes chronic intermittent bilateral first and third finger numbness occurring at night. Occasionally has pain that radiates up the left arm to the level of shoulder. Denies any neck pain or weakness. No injury or trauma. Reviewed lipid profile under good control with Lipitor. States received influenza vaccine at work. Continues to see endocrinology for diabetes and states last A1c stable at 7.1.  Past Medical History  Diagnosis Date  . Hyperlipidemia   . Diabetes mellitus type I     uses insulin pump// managed by Dr. Casimiro Needle Altheimer  . Hypertension   . Arthritis    Past Surgical History  Procedure Date  . Refractive surgery 2007    [facility]  . Rotator cuff repair 2004    left    reports that he has never smoked. He has never used smokeless tobacco. He reports that he drinks about .6 ounces of alcohol per week. He reports that he does not use illicit drugs. family history includes Hypertension in an unspecified family member and Stroke in an unspecified family member.  There is no history of Colon cancer, and Prostate cancer, and Breast cancer, and Rectal cancer, . No Known Allergies   Review of Systems see history of present illness     Objective:   Physical Exam  Nursing note and vitals reviewed. Constitutional: He appears well-developed and well-nourished. No distress.       Wearing right shoulder sling  HENT:  Head: Normocephalic and atraumatic.  Musculoskeletal:       Left arm full range of motion. No thenar muscle wasting. Phalen's negative on the left.  Neurological: He is  alert.  Skin: He is not diaphoretic.  Psychiatric: He has a normal mood and affect.          Assessment & Plan:

## 2012-02-17 NOTE — Assessment & Plan Note (Signed)
Good control. Continue Lipitor at current dosing

## 2012-02-17 NOTE — Assessment & Plan Note (Signed)
Provided with prescription for bilateral wrist splints. Attempt Voltaren with food and no other anti-inflammatories. Obtain record release for [facility] visit at which time he states he underwent plain radiograph of the neck. Consider possible cervical radicular symptoms. Followup if no improvement or worsening.

## 2012-02-19 ENCOUNTER — Telehealth: Payer: Self-pay | Admitting: *Deleted

## 2012-02-19 NOTE — Telephone Encounter (Signed)
i told him i would recommend direct referral to ortho

## 2012-02-19 NOTE — Telephone Encounter (Signed)
Pt called to report that his Workman's Compensation claim has been Denied; he is requesting order for MRI shoulder/SLS Please advise.

## 2012-02-22 ENCOUNTER — Other Ambulatory Visit: Payer: Self-pay | Admitting: Internal Medicine

## 2012-02-22 DIAGNOSIS — M25519 Pain in unspecified shoulder: Secondary | ICD-10-CM

## 2012-02-22 NOTE — Telephone Encounter (Signed)
Is he agreeable to ortho?

## 2012-02-22 NOTE — Telephone Encounter (Signed)
LMOM with contact name & number to relay provider recommendation & request return call with answer/SLS

## 2012-02-29 NOTE — Telephone Encounter (Signed)
Patient informed; has scheduled OV with Ortho Tuesday, 11.26.13/SLS

## 2012-03-18 ENCOUNTER — Ambulatory Visit: Payer: Managed Care, Other (non HMO) | Admitting: Internal Medicine

## 2012-03-18 MED ORDER — TRAMADOL HCL 50 MG PO TABS: 50.0000 mg | ORAL_TABLET | Freq: Three times a day (TID) | ORAL | Status: DC | PRN

## 2012-03-18 NOTE — Progress Notes (Signed)
  Subjective:    Patient ID: Scott Adkins, male    DOB: [birth year], 51 y.o.   MRN: [id number]  HPI Error in scheduling. Given ultram prescription for shoulder pain while in the office. Seeing orthopedics and awaiting mri. Not given pain medication by ortho. Advised of potential side effects of medication.  Review of Systems      Objective:   Physical Exam      Assessment & Plan:

## 2012-05-13 ENCOUNTER — Ambulatory Visit (INDEPENDENT_AMBULATORY_CARE_PROVIDER_SITE_OTHER): Payer: Managed Care, Other (non HMO) | Admitting: Family

## 2012-05-13 ENCOUNTER — Ambulatory Visit (HOSPITAL_BASED_OUTPATIENT_CLINIC_OR_DEPARTMENT_OTHER)
Admission: RE | Admit: 2012-05-13 | Discharge: 2012-05-13 | Disposition: A | Discharge status: Home / Self Care | Payer: Managed Care, Other (non HMO) | Source: Ambulatory Visit | Attending: Family | Admitting: Family

## 2012-05-13 ENCOUNTER — Telehealth: Payer: Self-pay | Admitting: Family

## 2012-05-13 ENCOUNTER — Encounter: Payer: Self-pay | Admitting: Family

## 2012-05-13 VITALS — BP 140/78 | HR 68 | Temp 98.0°F | Resp 16 | Wt 172.0 lb

## 2012-05-13 DIAGNOSIS — R05 Cough: Secondary | ICD-10-CM

## 2012-05-13 DIAGNOSIS — R059 Cough, unspecified: Secondary | ICD-10-CM

## 2012-05-13 DIAGNOSIS — R9389 Abnormal findings on diagnostic imaging of other specified body structures: Secondary | ICD-10-CM

## 2012-05-13 MED ORDER — BENZONATATE 100 MG PO CAPS: 100.0000 mg | ORAL_CAPSULE | Freq: Three times a day (TID) | ORAL | Status: AC | PRN

## 2012-05-13 MED ORDER — ALBUTEROL SULFATE HFA 108 (90 BASE) MCG/ACT IN AERS: 2.0000 | INHALATION_SPRAY | Freq: Four times a day (QID) | RESPIRATORY_TRACT | Status: DC | PRN

## 2012-05-13 MED ORDER — AZITHROMYCIN 250 MG PO TABS: ORAL_TABLET | ORAL | Status: DC

## 2012-05-13 NOTE — Patient Instructions (Addendum)
mucinex 600mg  twice daily for next few days until congestion is better. Complete your chest x ray  On the first floor.  Please follow up in 1 week, sooner if symptoms worsen or do not improve.

## 2012-05-13 NOTE — Telephone Encounter (Signed)
Reviewed x ray results with pt.  Instructed him to start zpak and go to imaging to complete x ray of thoracic spine. Pt verbalizes understanding.

## 2012-05-13 NOTE — Progress Notes (Signed)
Subjective:    Patient ID: Scott Adkins, male    DOB: 1960/06/29, 52 y.o.   MRN: [id number]  HPI  Scott Adkins is a 52 yr old male who presents today with chief complaint of cough.  Cough is described as productive of green sputum and has been present for 2 weeks. Also reports associated sob. Seen in urgent care 1 week ago and completed antibiotic (unsure of name) and prednisone 2 days ago without improvement of symptoms.   Review of Systems  HENT: Negative for ear pain, congestion and sore throat.    See HPI  Past Medical History  Diagnosis Date  . Hyperlipidemia   . Diabetes mellitus type I     uses insulin pump// managed by Dr. Casimiro Needle Altheimer  . Hypertension   . Arthritis     History   Social History  . Marital Status: Married    Spouse Name: N/A    Number of Children: N/A  . Years of Education: N/A   Occupational History  . Not on file.   Social History Main Topics  . Smoking status: Never Smoker   . Smokeless tobacco: Never Used  . Alcohol Use: 0.6 oz/week    1 Shots of liquor per week     Comment: Socially  . Drug Use: No  . Sexually Active: Not on file   Other Topics Concern  . Not on file   Social History Narrative   Married 14 yrs2 daughters; 1 sonOccupation: Time Scott Adkins    Past Surgical History  Procedure Date  . [facility]ractive surgery 2007    [facility]  . Rotator cuff repair 2004    left    Family History  Problem Relation Age of Onset  . Hypertension    . Stroke    . Colon cancer Neg Hx   . Prostate cancer Neg Hx   . Breast cancer Neg Hx   . Rectal cancer Neg Hx     No Known Allergies  Current Outpatient Prescriptions on File Prior to Visit  Medication Sig Dispense Refill  . aspirin 81 MG tablet Take 81 mg by mouth daily.      Marland Kitchen atorvastatin (LIPITOR) 40 MG tablet Take 1 tablet (40 mg total) by mouth daily.  30 tablet  6  . Cholecalciferol (VITAMIN D3) 1000 UNITS CAPS Take 2,000 Units by mouth daily.      . diclofenac  (VOLTAREN) 75 MG EC tablet One twice a day for 5 days then twice a day as needed (take with food)  30 tablet  0  . glucose blood (ACCU-CHEK AVIVA) test strip 1 each by Other route as needed. Use as instructed      . Lancets (ACCU-CHEK MULTICLIX) lancets 1 each by Other route as needed.      Marland Kitchen NOVOLOG 100 UNIT/ML injection Novolog insulin per insulin pump      . quinapril (ACCUPRIL) 40 MG tablet Take 40 mg by mouth daily.      . traMADol (ULTRAM) 50 MG tablet Take 1 tablet (50 mg total) by mouth every 8 (eight) hours as needed for pain.  30 tablet  0    BP 140/78  Pulse 68  Temp 98 F (36.7 C) (Oral)  Resp 16  Wt 172 lb (78.019 kg)  SpO2 99%       Objective:   Physical Exam  Constitutional: He is oriented to person, place, and time. He appears well-developed and well-nourished. No distress.  HENT:  Head:  Normocephalic and atraumatic.  Right Ear: Tympanic membrane and ear canal normal.  Left Ear: Tympanic membrane and ear canal normal.  Mouth/Throat: No oropharyngeal exudate or posterior oropharyngeal edema.  Cardiovascular: Normal rate and regular rhythm.   No murmur heard. Pulmonary/Chest: Effort normal. No respiratory distress.       Soft left sided expiratory wheeze, diminished breath sounds to the lung bases. No increased work of breathing  Musculoskeletal: He exhibits no edema.  Neurological: He is alert and oriented to person, place, and time.  Psychiatric: He has a normal mood and affect. His behavior is normal. Judgment and thought content normal.          Assessment & Plan:

## 2012-05-13 NOTE — Assessment & Plan Note (Signed)
Obtain X ray to exclude pneumonia. Add albuterol for wheeze, tessalon prn cough, mucinex to thin secretions.  Consider abx pending results of cxr. Follow up in 1 week.

## 2012-05-17 ENCOUNTER — Ambulatory Visit (HOSPITAL_BASED_OUTPATIENT_CLINIC_OR_DEPARTMENT_OTHER)
Admission: RE | Admit: 2012-05-17 | Discharge: 2012-05-17 | Disposition: A | Discharge status: Home / Self Care | Payer: Managed Care, Other (non HMO) | Source: Ambulatory Visit | Attending: Family | Admitting: Family

## 2012-05-17 DIAGNOSIS — R9389 Abnormal findings on diagnostic imaging of other specified body structures: Secondary | ICD-10-CM

## 2012-05-17 DIAGNOSIS — M549 Dorsalgia, unspecified: Secondary | ICD-10-CM | POA: Insufficient documentation

## 2012-05-17 DIAGNOSIS — M542 Cervicalgia: Secondary | ICD-10-CM | POA: Insufficient documentation

## 2012-06-01 ENCOUNTER — Encounter: Payer: Self-pay | Admitting: Family

## 2012-06-01 ENCOUNTER — Ambulatory Visit: Payer: Self-pay | Admitting: Family

## 2012-06-01 ENCOUNTER — Ambulatory Visit (INDEPENDENT_AMBULATORY_CARE_PROVIDER_SITE_OTHER): Payer: Managed Care, Other (non HMO) | Admitting: Family

## 2012-06-01 VITALS — BP 128/86 | HR 76 | Temp 97.9°F | Resp 16 | Wt 171.1 lb

## 2012-06-01 DIAGNOSIS — J45909 Unspecified asthma, uncomplicated: Secondary | ICD-10-CM | POA: Insufficient documentation

## 2012-06-01 MED ORDER — AZITHROMYCIN 250 MG PO TABS: ORAL_TABLET | ORAL | Status: DC

## 2012-06-01 MED ORDER — FLUTICASONE PROPIONATE 50 MCG/ACT NA SUSP: 2.0000 | Freq: Every day | NASAL | Status: DC

## 2012-06-01 MED ORDER — FLUTICASONE-SALMETEROL 100-50 MCG/DOSE IN AEPB: 1.0000 | INHALATION_SPRAY | Freq: Two times a day (BID) | RESPIRATORY_TRACT | Status: DC

## 2012-06-01 NOTE — Assessment & Plan Note (Signed)
Will add advair, add flonase for post nasal drip. Rx with zithromax for probable associated bronchitis.  If no improvement with these measures will refer to pulmonology.

## 2012-06-01 NOTE — Patient Instructions (Addendum)
Please call if symptoms worsen or if not improved in 1 week.  

## 2012-06-01 NOTE — Progress Notes (Signed)
Subjective:    Patient ID: Scott Adkins, male    DOB: 10-Dec-1960, 52 y.o.   MRN: [id number]  HPI  Mr. Moster is a 51 yr old male who presents today for follow up of cough. He reports no improvement in his cough. Cough is productive.  Cough is keeping him up at night.  He denies associated fever.  Cough is worse when laying flat.  + positive post nasal drip.  Some clear nasal discharge. He is a non-smoker.    Review of Systems See HPI  Past Medical History  Diagnosis Date  . Hyperlipidemia   . Diabetes mellitus type I     uses insulin pump// managed by Dr. Casimiro Needle Altheimer  . Hypertension   . Arthritis     History   Social History  . Marital Status: Married    Spouse Name: N/A    Number of Children: N/A  . Years of Education: N/A   Occupational History  . Not on file.   Social History Main Topics  . Smoking status: Never Smoker   . Smokeless tobacco: Never Used  . Alcohol Use: 0.6 oz/week    1 Shots of liquor per week     Comment: Socially  . Drug Use: No  . Sexually Active: Not on file   Other Topics Concern  . Not on file   Social History Narrative   Married 14 yrs   2 daughters; 1 son   Occupation: Time [employer]          Past Surgical History  Procedure Laterality Date  . Refractive surgery  2007    [facility]  . Rotator cuff repair  2004    left    Family History  Problem Relation Age of Onset  . Hypertension    . Stroke    . Colon cancer Neg Hx   . Prostate cancer Neg Hx   . Breast cancer Neg Hx   . Rectal cancer Neg Hx     No Known Allergies  Current Outpatient Prescriptions on File Prior to Visit  Medication Sig Dispense Refill  . albuterol (PROVENTIL HFA;VENTOLIN HFA) 108 (90 BASE) MCG/ACT inhaler Inhale 2 puffs into the lungs every 6 (six) hours as needed for wheezing.  1 Inhaler  0  . aspirin 81 MG tablet Take 81 mg by mouth daily.      Marland Kitchen atorvastatin (LIPITOR) 40 MG tablet Take 1 tablet (40 mg total) by mouth daily.  30  tablet  6  . Cholecalciferol (VITAMIN D3) 1000 UNITS CAPS Take 2,000 Units by mouth daily.      . diclofenac (VOLTAREN) 75 MG EC tablet One twice a day for 5 days then twice a day as needed (take with food)  30 tablet  0  . glucose blood (ACCU-CHEK AVIVA) test strip 1 each by Other route as needed. Use as instructed      . Lancets (ACCU-CHEK MULTICLIX) lancets 1 each by Other route as needed.      Marland Kitchen NOVOLOG 100 UNIT/ML injection Novolog insulin per insulin pump      . quinapril (ACCUPRIL) 40 MG tablet Take 40 mg by mouth daily.      . traMADol (ULTRAM) 50 MG tablet Take 1 tablet (50 mg total) by mouth every 8 (eight) hours as needed for pain.  30 tablet  0   No current facility-administered medications on file prior to visit.    BP 128/86  Pulse 76  Temp(Src) 97.9 F (36.6  C) (Oral)  Resp 16  Wt 171 lb 1.9 oz (77.62 kg)  BMI 26.8 kg/m2  SpO2 99%       Objective:   Physical Exam  Constitutional: He appears well-developed and well-nourished.  HENT:  Head: Normocephalic and atraumatic.  Right Ear: Tympanic membrane and ear canal normal.  Left Ear: Tympanic membrane and ear canal normal.  Mouth/Throat: No posterior oropharyngeal edema or posterior oropharyngeal erythema.  Cardiovascular: Normal rate and regular rhythm.   No murmur heard. Pulmonary/Chest: Effort normal and breath sounds normal. No respiratory distress. He has no rales. He exhibits no tenderness.  Very soft left sided exp wheeze.  Musculoskeletal: He exhibits no edema.  Psychiatric: He has a normal mood and affect. His behavior is normal. Judgment and thought content normal.          Assessment & Plan:

## 2012-08-15 ENCOUNTER — Ambulatory Visit: Payer: Self-pay | Admitting: Internal Medicine

## 2012-08-15 ENCOUNTER — Ambulatory Visit (INDEPENDENT_AMBULATORY_CARE_PROVIDER_SITE_OTHER): Payer: BC Managed Care – PPO | Admitting: Family Medicine

## 2012-08-15 ENCOUNTER — Encounter: Payer: Self-pay | Admitting: Family Medicine

## 2012-08-15 VITALS — BP 126/74 | HR 73 | Temp 97.7°F | Ht 67.0 in | Wt 177.1 lb

## 2012-08-15 DIAGNOSIS — E109 Type 1 diabetes mellitus without complications: Secondary | ICD-10-CM

## 2012-08-15 DIAGNOSIS — S4980XA Other specified injuries of shoulder and upper arm, unspecified arm, initial encounter: Secondary | ICD-10-CM

## 2012-08-15 DIAGNOSIS — J45909 Unspecified asthma, uncomplicated: Secondary | ICD-10-CM

## 2012-08-15 DIAGNOSIS — F4322 Adjustment disorder with anxiety: Secondary | ICD-10-CM

## 2012-08-15 DIAGNOSIS — I10 Essential (primary) hypertension: Secondary | ICD-10-CM

## 2012-08-15 DIAGNOSIS — S46001A Unspecified injury of muscle(s) and tendon(s) of the rotator cuff of right shoulder, initial encounter: Secondary | ICD-10-CM

## 2012-08-15 NOTE — Progress Notes (Signed)
Patient ID: Scott Adkins, male   DOB: 02-26-1961, 52 y.o.   MRN: [id number] Scott Adkins [id number] 1960/05/08 08/15/2012      Progress Note-Follow Up  Subjective  Chief Complaint  Chief Complaint  Patient presents with  . Follow-up    HPI  Patient is a 52 year old Caucasian male who is in today for followup. He needs a form filled out for the Department [employer]. He denies any recent illness. He denies any headaches or chest pain, palpitations or shortness of breath. He denies any GI or GU concerns   No Syncopal episodes or recent trips to the emergency room. Is taking his medications as prescribed denies any concerning hi or low blood sugars. Has had trouble with right shoulder pain due to a tear but finds the pain manageable and has FROM Past Medical History  Diagnosis Date  . Hyperlipidemia   . Diabetes mellitus type I     uses insulin pump// managed by Dr. Casimiro Needle Altheimer  . Hypertension   . Arthritis     Past Surgical History  Procedure Laterality Date  . Refractive surgery  2007    [facility]  . Rotator cuff repair  2004    left    Family History  Problem Relation Age of Onset  . Hypertension    . Stroke    . Colon cancer Neg Hx   . Prostate cancer Neg Hx   . Breast cancer Neg Hx   . Rectal cancer Neg Hx     History   Social History  . Marital Status: Married    Spouse Name: N/A    Number of Children: N/A  . Years of Education: N/A   Occupational History  . Not on file.   Social History Main Topics  . Smoking status: Never Smoker   . Smokeless tobacco: Never Used  . Alcohol Use: 0.6 oz/week    1 Shots of liquor per week     Comment: Socially  . Drug Use: No  . Sexually Active: Not on file   Other Topics Concern  . Not on file   Social History Narrative   Married 14 yrs   2 daughters; 1 son   Occupation: Time Physiological scientist          Current Outpatient Prescriptions on File Prior to Visit  Medication Sig Dispense Refill  .  albuterol (PROVENTIL HFA;VENTOLIN HFA) 108 (90 BASE) MCG/ACT inhaler Inhale 2 puffs into the lungs every 6 (six) hours as needed for wheezing.  1 Inhaler  0  . aspirin 81 MG tablet Take 81 mg by mouth daily.      Marland Kitchen atorvastatin (LIPITOR) 40 MG tablet Take 1 tablet (40 mg total) by mouth daily.  30 tablet  6  . Cholecalciferol (VITAMIN D3) 1000 UNITS CAPS Take 2,000 Units by mouth daily.      . diclofenac (VOLTAREN) 75 MG EC tablet One twice a day for 5 days then twice a day as needed (take with food)  30 tablet  0  . Fluticasone-Salmeterol (ADVAIR) 100-50 MCG/DOSE AEPB Inhale 1 puff into the lungs 2 (two) times daily.  1 each  3  . glucose blood (ACCU-CHEK AVIVA) test strip 1 each by Other route as needed. Use as instructed      . Lancets (ACCU-CHEK MULTICLIX) lancets 1 each by Other route as needed.      Marland Kitchen NOVOLOG 100 UNIT/ML injection Novolog insulin per insulin pump      . quinapril (  ACCUPRIL) 40 MG tablet Take 40 mg by mouth daily.       No current facility-administered medications on file prior to visit.    No Known Allergies  Review of Systems  Review of Systems  Constitutional: Negative for fever and malaise/fatigue.  HENT: Negative for congestion.   Eyes: Negative for discharge.  Respiratory: Negative for shortness of breath.   Cardiovascular: Negative for chest pain, palpitations and leg swelling.  Gastrointestinal: Negative for nausea, abdominal pain and diarrhea.  Genitourinary: Negative for dysuria.  Musculoskeletal: Positive for joint pain. Negative for falls.  Skin: Negative for rash.  Neurological: Negative for loss of consciousness and headaches.  Endo/Heme/Allergies: Negative for polydipsia.  Psychiatric/Behavioral: Negative for depression and suicidal ideas. The patient is not nervous/anxious and does not have insomnia.     Objective  BP 126/74  Pulse 73  Temp(Src) 97.7 F (36.5 C) (Oral)  Ht 5\' 7"  (1.702 m)  Wt 177 lb 1.3 oz (80.323 kg)  BMI 27.73 kg/m2   SpO2 97%  Physical Exam  Physical Exam  Constitutional: He is oriented to person, place, and time and well-developed, well-nourished, and in no distress. No distress.  HENT:  Head: Normocephalic and atraumatic.  Eyes: Conjunctivae are normal.  Neck: Neck supple. No thyromegaly present.  Cardiovascular: Normal rate, regular rhythm and normal heart sounds.   No murmur heard. Pulmonary/Chest: Effort normal and breath sounds normal. No respiratory distress.  Abdominal: Soft. Bowel sounds are normal. He exhibits no distension and no mass. There is no tenderness.  Musculoskeletal: He exhibits no edema.  Neurological: He is alert and oriented to person, place, and time.  Skin: Skin is warm.  Psychiatric: Memory, affect and judgment normal.    No results found for this basename: TSH   Lab Results  Component Value Date   WBC 12.7* 12/27/2009   HGB 12.0* 12/27/2009   HCT 36.3* 12/27/2009   MCV 89.2 12/27/2009   PLT 150 12/27/2009   Lab Results  Component Value Date   CREATININE 1.03 12/26/2009   BUN 19 12/26/2009   NA 136 12/26/2009   K 4.8 12/26/2009   CL 102 12/26/2009   CO2 30 12/26/2009   Lab Results  Component Value Date   ALT 13 12/14/2011   AST 14 12/14/2011   ALKPHOS 60 12/14/2011   BILITOT 0.4 12/14/2011   Lab Results  Component Value Date   CHOL 140 12/14/2011   Lab Results  Component Value Date   HDL 58 12/14/2011   Lab Results  Component Value Date   LDLCALC 72 12/14/2011   Lab Results  Component Value Date   TRIG 50 12/14/2011   Lab Results  Component Value Date   CHOLHDL 2.4 12/14/2011     Assessment & Plan DIABETES MELLITUS, TYPE I Reports stable blood sugars, no recent concerning hi or low numbers. Continue current meds  ADJUSTMENT DISORDER WITH ANXIETY Does not currently take any meds or feel he needs them.  Unspecified asthma Mild, uses his albuterol very infrequently  HYPERTENSION Well controlled, no change in meds  Injury of right rotator cuff He has  been told by ortho that he would benefit from surgery but is choosing to avoid surgery if possible. Tries to not lift heavy objects and to keep his joint mobile

## 2012-08-15 NOTE — Patient Instructions (Addendum)
Rel of rec for last 2 sets of labs with endocrinology, [facility] Sprang, Dr Onalee Hua Altheimer  6-7 months annual check up

## 2012-08-16 ENCOUNTER — Encounter: Payer: Self-pay | Admitting: Family Medicine

## 2012-08-16 DIAGNOSIS — S46001A Unspecified injury of muscle(s) and tendon(s) of the rotator cuff of right shoulder, initial encounter: Secondary | ICD-10-CM

## 2012-08-16 HISTORY — DX: Unspecified injury of muscle(s) and tendon(s) of the rotator cuff of right shoulder, initial encounter: S46.001A

## 2012-08-16 NOTE — Assessment & Plan Note (Signed)
Mild, uses his albuterol very infrequently

## 2012-08-16 NOTE — Assessment & Plan Note (Signed)
Reports stable blood sugars, no recent concerning hi or low numbers. Continue current meds

## 2012-08-16 NOTE — Assessment & Plan Note (Signed)
He has been told by ortho that he would benefit from surgery but is choosing to avoid surgery if possible. Tries to not lift heavy objects and to keep his joint mobile

## 2012-08-16 NOTE — Assessment & Plan Note (Signed)
Does not currently take any meds or feel he needs them.

## 2012-08-16 NOTE — Assessment & Plan Note (Signed)
Well controlled, no change in meds 

## 2012-08-24 ENCOUNTER — Telehealth: Payer: Self-pay

## 2012-08-24 NOTE — Telephone Encounter (Signed)
Opened in error

## 2012-10-27 ENCOUNTER — Telehealth: Payer: Self-pay | Admitting: Family Medicine

## 2012-10-27 ENCOUNTER — Telehealth: Payer: Self-pay | Admitting: Internal Medicine

## 2012-10-27 NOTE — Telephone Encounter (Signed)
Received medical records from [facility] °

## 2012-10-27 NOTE — Telephone Encounter (Signed)
Patient called in stating that he needed to cancel his ED follow up for tomorrow because he states that he no longer has insurance and will be out of town until [place]. I explained to him that he did need to be seen and offered appointment for sometime next week. I also explained to him that we could always bill him for his visit. Patient states that he did not even want to get a bill from [place] and to cancel the appointment. Appointment cancelled.

## 2012-10-28 ENCOUNTER — Ambulatory Visit: Payer: Self-pay | Admitting: Physician Assistant

## 2013-01-13 ENCOUNTER — Emergency Department (HOSPITAL_COMMUNITY)
Admission: EM | Admit: 2013-01-13 | Discharge: 2013-01-13 | Disposition: A | Discharge status: Home / Self Care | Payer: BC Managed Care – PPO | Attending: Emergency Medicine | Admitting: Emergency Medicine

## 2013-01-13 ENCOUNTER — Encounter (HOSPITAL_COMMUNITY): Payer: Self-pay | Admitting: Emergency Medicine

## 2013-01-13 DIAGNOSIS — M545 Low back pain, unspecified: Secondary | ICD-10-CM | POA: Insufficient documentation

## 2013-01-13 DIAGNOSIS — Z7982 Long term (current) use of aspirin: Secondary | ICD-10-CM | POA: Insufficient documentation

## 2013-01-13 DIAGNOSIS — E109 Type 1 diabetes mellitus without complications: Secondary | ICD-10-CM | POA: Insufficient documentation

## 2013-01-13 DIAGNOSIS — Z79899 Other long term (current) drug therapy: Secondary | ICD-10-CM | POA: Insufficient documentation

## 2013-01-13 DIAGNOSIS — R350 Frequency of micturition: Secondary | ICD-10-CM | POA: Insufficient documentation

## 2013-01-13 DIAGNOSIS — M129 Arthropathy, unspecified: Secondary | ICD-10-CM | POA: Insufficient documentation

## 2013-01-13 DIAGNOSIS — Z87828 Personal history of other (healed) physical injury and trauma: Secondary | ICD-10-CM | POA: Insufficient documentation

## 2013-01-13 DIAGNOSIS — E785 Hyperlipidemia, unspecified: Secondary | ICD-10-CM | POA: Insufficient documentation

## 2013-01-13 DIAGNOSIS — I10 Essential (primary) hypertension: Secondary | ICD-10-CM | POA: Insufficient documentation

## 2013-01-13 LAB — URINALYSIS, ROUTINE W REFLEX MICROSCOPIC
Hgb urine dipstick: NEGATIVE
Leukocytes, UA: NEGATIVE
Specific Gravity, Urine: 1.029 (ref 1.005–1.030)
Urobilinogen, UA: 0.2 mg/dL (ref 0.0–1.0)

## 2013-01-13 LAB — BASIC METABOLIC PANEL
CO2: 29 mEq/L (ref 19–32)
GFR calc non Af Amer: >90 mL/min (ref >90)
Glucose, Bld: 66 mg/dL — ABNORMAL LOW (ref 70–99)
Potassium: 4.5 mEq/L (ref 3.5–5.1)
Sodium: 140 mEq/L (ref 135–145)

## 2013-01-13 LAB — CBC
Hemoglobin: 14.2 g/dL (ref 13.0–17.0)
MCHC: 33.6 g/dL (ref 30.0–36.0)
Platelets: 192 10*3/uL (ref 150–400)
RBC: 4.75 MIL/uL (ref 4.22–5.81)

## 2013-01-13 LAB — GLUCOSE, CAPILLARY
Glucose-Capillary: 152 mg/dL — ABNORMAL HIGH (ref 70–99)
Glucose-Capillary: 198 mg/dL — ABNORMAL HIGH (ref 70–99)

## 2013-01-13 MED ORDER — METHOCARBAMOL 500 MG PO TABS: 500.0000 mg | ORAL_TABLET | Freq: Two times a day (BID) | ORAL | Status: DC

## 2013-01-13 MED ORDER — KETOROLAC TROMETHAMINE 15 MG/ML IJ SOLN
15.0000 mg | Freq: Once | INTRAMUSCULAR | Status: AC
  Administered 2013-01-13: 15 mg via INTRAVENOUS
  Filled 2013-01-13: qty 1

## 2013-01-13 MED ORDER — SODIUM CHLORIDE 0.9 % IV BOLUS (SEPSIS)
1000.0000 mL | Freq: Once | INTRAVENOUS | Status: AC
  Administered 2013-01-13: 1000 mL via INTRAVENOUS

## 2013-01-13 MED ORDER — MORPHINE SULFATE 4 MG/ML IJ SOLN: 4.0000 mg | Freq: Once | INTRAMUSCULAR | Status: DC

## 2013-01-13 MED ORDER — NAPROXEN 375 MG PO TABS: 375.0000 mg | ORAL_TABLET | Freq: Two times a day (BID) | ORAL | Status: DC

## 2013-01-13 NOTE — ED Notes (Signed)
C/o right flank pain since yesterday progressively getting worse.

## 2013-01-13 NOTE — ED Notes (Signed)
Reports pain non radiating, increases with mvmt. States does a lot of heavy lifting all day at work.  Denies n/v/d, dysuria, hematuria. C/o urinary frequency & blood sugars running high past few days.

## 2013-01-13 NOTE — ED Provider Notes (Signed)
CSN: [id number]     Arrival date & time 01/13/13  0745 History   First MD Initiated Contact with Patient 01/13/13 [phone]     Chief Complaint  Patient presents with  . Flank Pain   (Consider location/radiation/quality/duration/timing/severity/associated sxs/prior Treatment) HPI Comments: 52 year old male presents emergency department with chief complaint of right flank and lower back pain. Patient's past history is significant for insulin-dependent diabetes mellitus, hypertension, hyperlipidemia.   Onset of pain was last night at approximately 8:00 PM the patient was sitting on the couch watching TV. Patient states the pain is worse when he bends or twists.  Patient rates the pain 7/10 and states it does not radiate. 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 He denies fever, chills, chest pain, shortness of breath, cough, nausea vomiting diarrhea, constipation, abdominal pain, testicular pain.  Patient does report his blood sugars have been varying and he does report drinking and voiding more than usual.  BG this AM was 209, but yesterday was in the 500 range.  He denies recent illness, infection, or use of antibiotics. He denies bowel or bladder symptoms of retention or incontinence.  Patient is a 52 y.o. male presenting with flank pain. The history is provided by the patient and a relative.  Flank Pain This is a new problem. The current episode started 12 to 24 hours ago. The problem occurs constantly. The problem has not changed since onset.Pertinent negatives include no chest pain, no abdominal pain, no headaches and no shortness of breath. The symptoms are aggravated by twisting. Nothing relieves the symptoms. He has tried nothing for the symptoms.    Past Medical History  Diagnosis Date  . Hyperlipidemia   . Diabetes mellitus type I     uses insulin pump// managed by Dr. Casimiro Needle [place]  . Hypertension   . Arthritis   . Injury of  right rotator cuff 08/16/2012    Has been seen by [facility]   Past Surgical History  Procedure Laterality Date  . Refractive surgery  2007    [facility]  . Rotator cuff repair  2004    left   Family History  Problem Relation Age of Onset  . Hypertension    . Stroke    . Colon cancer Neg Hx   . Prostate cancer Neg Hx   . Breast cancer Neg Hx   . Rectal cancer Neg Hx    History  Substance Use Topics  . Smoking status: Never Smoker   . Smokeless tobacco: Never Used  . Alcohol Use: 0.6 oz/week    1 Shots of liquor per week     Comment: Socially    Review of Systems  Constitutional: Positive for activity change. Negative for fever, chills, diaphoresis, appetite change, fatigue and unexpected weight change.  HENT: Negative.   Eyes: Negative.   Respiratory: Negative.  Negative for shortness of breath.   Cardiovascular: Negative.  Negative for chest pain.  Gastrointestinal: Negative.  Negative for abdominal pain.  Endocrine: Negative.   Genitourinary: Positive for frequency and flank pain. Negative for hematuria, discharge, penile swelling, scrotal swelling, genital sores and penile pain.  Allergic/Immunologic: Negative.   Neurological: Negative for headaches.  Hematological: Negative.     Allergies  Review of patient's allergies indicates no known allergies.  Home Medications   Current Outpatient Rx  Name  Route  Sig  Dispense  Refill  . albuterol (PROVENTIL HFA;VENTOLIN HFA) 108 (90 BASE) MCG/ACT inhaler  Inhalation   Inhale 2 puffs into the lungs every 6 (six) hours as needed for wheezing.   1 Inhaler   0   . aspirin 81 MG tablet   Oral   Take 81 mg by mouth daily.         Marland Kitchen EXPIRED: atorvastatin (LIPITOR) 40 MG tablet   Oral   Take 1 tablet (40 mg total) by mouth daily.   30 tablet   6   . Cholecalciferol (VITAMIN D3) 1000 UNITS CAPS   Oral   Take 2,000 Units by mouth daily.         . diclofenac (VOLTAREN) 75 MG EC tablet      One twice a day  for 5 days then twice a day as needed (take with food)   30 tablet   0   . Fluticasone-Salmeterol (ADVAIR) 100-50 MCG/DOSE AEPB   Inhalation   Inhale 1 puff into the lungs 2 (two) times daily.   1 each   3   . glucose blood (ACCU-CHEK AVIVA) test strip   Other   1 each by Other route as needed. Use as instructed         . Lancets (ACCU-CHEK MULTICLIX) lancets   Other   1 each by Other route as needed.         Marland Kitchen NOVOLOG 100 UNIT/ML injection      Novolog insulin per insulin pump         . quinapril (ACCUPRIL) 40 MG tablet   Oral   Take 40 mg by mouth daily.          BP 165/83  Pulse 68  Temp(Src) 97.9 F (36.6 C) (Oral)  Resp 18  Ht 5' 7.5" (1.715 m)  Wt 176 lb (79.833 kg)  BMI 27.14 kg/m2  SpO2 98% Physical Exam  Constitutional: He is oriented to person, place, and time. He appears well-developed and well-nourished.  Patient is able to transfer to and from bed bend over and take his pants off and put them back on.  HENT:  Head: Normocephalic and atraumatic.  Eyes: Conjunctivae are normal.  Neck: Normal range of motion. Neck supple. No JVD present. No tracheal deviation present. No thyromegaly present.  Cardiovascular: Normal rate and regular rhythm.  Exam reveals no gallop and no friction rub.   No murmur heard. Pulmonary/Chest: Effort normal and breath sounds normal. No stridor. No respiratory distress. He has no wheezes. He has no rales. He exhibits no tenderness.  Abdominal: Soft. Bowel sounds are normal. He exhibits no distension and no mass. There is no tenderness. There is CVA tenderness. There is no rebound and no guarding. Hernia confirmed negative in the right inguinal area and confirmed negative in the left inguinal area.  Minimal left-sided CVA tenderness noted  Genitourinary: Testes normal. Right testis shows no mass, no swelling and no tenderness. Left testis shows no mass, no swelling and no tenderness. No penile tenderness.  Musculoskeletal:        Thoracic back: He exhibits pain and spasm. He exhibits normal range of motion, no tenderness, no bony tenderness and no swelling.       Lumbar back: He exhibits decreased range of motion, tenderness, pain and spasm. He exhibits no bony tenderness, no swelling, no edema and no deformity.       Back:  Negative straight leg raise,  Neurological: He is alert and oriented to person, place, and time. He has normal reflexes. He displays normal reflexes. He exhibits normal muscle  tone. Coordination normal.  Moving all extremities, sensation intact throughout, muscle strength 5 out of 5 throughout, DTRs normal    ED Course  Procedures (including critical care time) Labs Review Results for orders placed during the hospital encounter of 01/13/13  CBC      Result Value Range   WBC 6.4  4.0 - 10.5 K/uL   RBC 4.75  4.22 - 5.81 MIL/uL   Hemoglobin 14.2  13.0 - 17.0 g/dL   HCT 40.9  81.1 - 91.4 %   MCV 89.1  78.0 - 100.0 fL   MCH 29.9  26.0 - 34.0 pg   MCHC 33.6  30.0 - 36.0 g/dL   RDW 78.2  95.6 - 21.3 %   Platelets 192  150 - 400 K/uL  BASIC METABOLIC PANEL      Result Value Range   Sodium 140  135 - 145 mEq/L   Potassium 4.5  3.5 - 5.1 mEq/L   Chloride 103  96 - 112 mEq/L   CO2 29  19 - 32 mEq/L   Glucose, Bld 66 (*) 70 - 99 mg/dL   BUN 21  6 - 23 mg/dL   Creatinine, Ser 0.86  0.50 - 1.35 mg/dL   Calcium 9.6  8.4 - 57.8 mg/dL   GFR calc non Af Amer >90  >90 mL/min   GFR calc Af Amer >90  >90 mL/min  URINALYSIS, ROUTINE W REFLEX MICROSCOPIC      Result Value Range   Color, Urine YELLOW  YELLOW   APPearance CLEAR  CLEAR   Specific Gravity, Urine 1.029  1.005 - 1.030   pH 5.0  5.0 - 8.0   Glucose, UA 250 (*) NEGATIVE mg/dL   Hgb urine dipstick NEGATIVE  NEGATIVE   Bilirubin Urine NEGATIVE  NEGATIVE   Ketones, ur NEGATIVE  NEGATIVE mg/dL   Protein, ur NEGATIVE  NEGATIVE mg/dL   Urobilinogen, UA 0.2  0.0 - 1.0 mg/dL   Nitrite NEGATIVE  NEGATIVE   Leukocytes, UA NEGATIVE  NEGATIVE    EMERGENCY DEPARTMENT [place] ABD/AORTA EXAM Study: Limited Ultrasound of the Abdominal Aorta.  INDICATIONS:Back pain Indication: Multiple views of the abdominal aorta are obtained from the diaphragmatic hiatus to the aortic bifurcation in transverse and sagittal planes with a multi- Frequency probe.  PERFORMED BY: Myself  IMAGES ARCHIVED?: Yes  FINDINGS: Maximum aortic dimensions are 3cm  LIMITATIONS:  Body habitus and Bowel gas  INTERPRETATION:  No abdominal aortic aneurysm  COMMENT:  No evidence of AAA    MDM  No diagnosis found. 52 year old male with past medical history of insulin-dependent diabetes mellitus, hypertension, hyperlipidemia presents with chief complaint of right lower back pain. Onset was last night at approximately 8:00 PM. Vital signs are normal. Physical exam consistent with right-sided paravertebral muscular tenderness palpation and spasm. Abdominal and GU exam are negative.   Plan to obtain urinalysis to assess for UTI versus kidney stone. I will also obtain blood work as patient has insulin-dependent diabetes mellitus and he had blood sugars in the 500 range one day ago.  Will treat pain with morphine and toradol.    10:38 AM VSS; ER w/u negative to include UA and [place] of Aorta.  Pt with bg of 66.  Will provide PO intake (sandwich and juice) and recheck BG and then d/c with NSAIDs and Muscle relaxant.  Doubt CE syndrome, stone, UTI, pyelo, AAA, Fx, infection, tumor.  No imaging b/c pt has no h/o trauma and a normal neurologic exam.  11:37 AM Repeat BG 157.  Plan to discharge patient to home. ER precautions given. Discharge patient with Naprosyn and Robaxin. Patient agrees with plan and is discharged to home with family members.    Darlys Gales, MD 01/13/13 [phone]

## 2013-02-07 ENCOUNTER — Ambulatory Visit: Payer: Self-pay | Admitting: Family Medicine

## 2013-02-16 ENCOUNTER — Encounter: Payer: Self-pay | Admitting: Family Medicine

## 2013-02-16 ENCOUNTER — Ambulatory Visit (INDEPENDENT_AMBULATORY_CARE_PROVIDER_SITE_OTHER): Payer: BC Managed Care – PPO | Admitting: Family Medicine

## 2013-02-16 VITALS — BP 122/82 | HR 65 | Temp 97.9°F | Resp 16 | Ht 67.5 in | Wt 173.0 lb

## 2013-02-16 DIAGNOSIS — E119 Type 2 diabetes mellitus without complications: Secondary | ICD-10-CM

## 2013-02-16 DIAGNOSIS — E785 Hyperlipidemia, unspecified: Secondary | ICD-10-CM

## 2013-02-16 DIAGNOSIS — Z0000 Encounter for general adult medical examination without abnormal findings: Secondary | ICD-10-CM

## 2013-02-16 DIAGNOSIS — J45909 Unspecified asthma, uncomplicated: Secondary | ICD-10-CM

## 2013-02-16 DIAGNOSIS — I10 Essential (primary) hypertension: Secondary | ICD-10-CM

## 2013-02-16 DIAGNOSIS — E109 Type 1 diabetes mellitus without complications: Secondary | ICD-10-CM

## 2013-02-16 LAB — RENAL FUNCTION PANEL
Albumin: 4.4 g/dL (ref 3.5–5.2)
BUN: 15 mg/dL (ref 6–23)
CO2: 31 mEq/L (ref 19–32)
Calcium: 9.7 mg/dL (ref 8.4–10.5)
Creat: 0.99 mg/dL (ref 0.50–1.35)
Glucose, Bld: 235 mg/dL — ABNORMAL HIGH (ref 70–99)
Sodium: 138 mEq/L (ref 135–145)

## 2013-02-16 LAB — LIPID PANEL
Cholesterol: 209 mg/dL — ABNORMAL HIGH (ref 0–200)
Triglycerides: 67 mg/dL (ref <150)
VLDL: 13 mg/dL (ref 0–40)

## 2013-02-16 LAB — PSA: PSA: 0.46 ng/mL (ref <=4.00)

## 2013-02-16 LAB — HEMOGLOBIN A1C: Hgb A1c MFr Bld: 7.8 % — ABNORMAL HIGH (ref <5.7)

## 2013-02-16 LAB — HEPATIC FUNCTION PANEL
ALT: 14 U/L (ref 0–53)
AST: 15 U/L (ref 0–37)
Albumin: 4.4 g/dL (ref 3.5–5.2)
Bilirubin, Direct: 0.2 mg/dL (ref 0.0–0.3)
Total Protein: 7.1 g/dL (ref 6.0–8.3)

## 2013-02-16 LAB — CBC
HCT: 42.5 % (ref 39.0–52.0)
MCH: 28.9 pg (ref 26.0–34.0)
MCV: 87.1 fL (ref 78.0–100.0)
RDW: 12.3 % (ref 11.5–15.5)
WBC: 5.7 10*3/uL (ref 4.0–10.5)

## 2013-02-16 LAB — TSH: TSH: 1.755 u[IU]/mL (ref 0.350–4.500)

## 2013-02-16 NOTE — Progress Notes (Signed)
Patient ID: Scott Adkins, male   DOB: Mar 01, 1961, 52 y.o.   MRN: [id number] Scott Adkins [id number] [birth year] 02/16/2013      Progress Note-Follow Up  Subjective  Chief Complaint  Chief Complaint  Patient presents with  . Annual Exam    Pt fasting for labs.    HPI  Patient is a 52 year old male who is in today for annual exam. Feeling well. No recent illness. Does acknowledge feeling labile blood sugars. He has been as low as 39 and as high as 473 he recently. Typically runs in the 150-300 range. Denies polyuria and polydipsia. Denies chest pain or palpitations. Denies shortness or breath GI or GU concerns. Follows with endocrinology and have him from pump. No GI or GU complaints. Does see an ophthalmologist for routine eye care.  Past Medical History  Diagnosis Date  . Hyperlipidemia   . Diabetes mellitus type I     uses insulin pump// managed by Dr. Casimiro Needle Adkins  . Hypertension   . Arthritis   . Injury of right rotator cuff 08/16/2012    Has been seen by [facility]    Past Surgical History  Procedure Laterality Date  . Refractive surgery  2007    [facility]  . Rotator cuff repair  2004    left    Family History  Problem Relation Age of Onset  . Hypertension    . Stroke    . Colon cancer Neg Hx   . Prostate cancer Neg Hx   . Breast cancer Neg Hx   . Rectal cancer Neg Hx   . Hypertension Mother   . Stroke Father   . Hypertension Father   . Hyperlipidemia Father   . Hypertension Brother   . Hyperlipidemia Brother   . Mental illness Brother     depression  . Heart disease Maternal Grandmother     MI  . Heart disease Maternal Grandfather     MI  . Hyperlipidemia Brother   . Hypertension Brother     History   Social History  . Marital Status: Married    Spouse Name: N/A    Number of Children: N/A  . Years of Education: N/A   Occupational History  . Not on file.   Social History Main Topics  . Smoking status: Never Smoker   . Smokeless  tobacco: Never Used  . Alcohol Use: 0.6 oz/week    1 Shots of liquor per week     Comment: Socially  . Drug Use: No  . Sexual Activity: Not on file   Other Topics Concern  . Not on file   Social History Narrative   Married 14 yrs   2 daughters; 1 son   Occupation: Time Physiological scientist          Current Outpatient Prescriptions on File Prior to Visit  Medication Sig Dispense Refill  . aspirin 81 MG tablet Take 81 mg by mouth daily.      . Cholecalciferol (VITAMIN D3) 1000 UNITS CAPS Take 2,000 Units by mouth daily.      Marland Kitchen NOVOLOG 100 UNIT/ML injection Novolog insulin per insulin pump       No current facility-administered medications on file prior to visit.    No Known Allergies  Review of Systems  Review of Systems  Constitutional: Negative for fever, chills and malaise/fatigue.  HENT: Negative for congestion, hearing loss and nosebleeds.   Eyes: Negative for discharge.  Respiratory: Negative for cough, sputum production, shortness of breath  and wheezing.   Cardiovascular: Negative for chest pain, palpitations and leg swelling.  Gastrointestinal: Negative for heartburn, nausea, vomiting, abdominal pain, diarrhea, constipation and blood in stool.  Genitourinary: Negative for dysuria, urgency, frequency and hematuria.  Musculoskeletal: Negative for back pain, falls and myalgias.  Skin: Negative for rash.  Neurological: Negative for dizziness, tremors, sensory change, focal weakness, loss of consciousness, weakness and headaches.  Endo/Heme/Allergies: Negative for polydipsia. Does not bruise/bleed easily.  Psychiatric/Behavioral: Negative for depression and suicidal ideas. The patient is not nervous/anxious and does not have insomnia.     Objective  BP 122/82  Pulse 65  Temp(Src) 97.9 F (36.6 C) (Oral)  Resp 16  Ht 5' 7.5" (1.715 m)  Wt 173 lb (78.472 kg)  BMI 26.68 kg/m2  SpO2 97%  Physical Exam  Physical Exam  Constitutional: He is oriented to person, place,  and time and well-developed, well-nourished, and in no distress. No distress.  HENT:  Head: Normocephalic and atraumatic.  Eyes: Conjunctivae are normal.  Neck: Neck supple. No thyromegaly present.  Cardiovascular: Normal rate, regular rhythm and normal heart sounds.   No murmur heard. Pulmonary/Chest: Effort normal and breath sounds normal. No respiratory distress.  Abdominal: He exhibits no distension and no mass. There is no tenderness.  Musculoskeletal: He exhibits no edema.  Neurological: He is alert and oriented to person, place, and time.  Skin: Skin is warm.  Psychiatric: Memory, affect and judgment normal.    No results found for this basename: TSH   Lab Results  Component Value Date   WBC 6.4 01/13/2013   HGB 14.2 01/13/2013   HCT 42.3 01/13/2013   MCV 89.1 01/13/2013   PLT 192 01/13/2013   Lab Results  Component Value Date   CREATININE 0.95 01/13/2013   BUN 21 01/13/2013   NA 140 01/13/2013   K 4.5 01/13/2013   CL 103 01/13/2013   CO2 29 01/13/2013   Lab Results  Component Value Date   ALT 13 12/14/2011   AST 14 12/14/2011   ALKPHOS 60 12/14/2011   BILITOT 0.4 12/14/2011   Lab Results  Component Value Date   CHOL 140 12/14/2011   Lab Results  Component Value Date   HDL 58 12/14/2011   Lab Results  Component Value Date   LDLCALC 72 12/14/2011   Lab Results  Component Value Date   TRIG 50 12/14/2011   Lab Results  Component Value Date   CHOLHDL 2.4 12/14/2011     Assessment & Plan  HYPERTENSION Well controlled, no changes  DIABETES MELLITUS, TYPE I Following with endocrinology, has a insulin pump and has an appt soon. His sugars have been very labile, encouraged to eat small, frequent meals with lean proteins  Unspecified asthma(493.90) No need for alubterol in a very long time  HYPERLIPIDEMIA Tolerating Simvastatin, avoid trans fats, increase exercise as tolerated.   Preventative health care Encouraged heart healthy diet, adequate sleep and  exercise, has already had flu shot for the year. Colonoscopy was 2013

## 2013-02-16 NOTE — Patient Instructions (Addendum)
Probiotics daily, such as [employer]est[employer]vantage Wallene Huh, Icy Hot   Preventive Care for Adults, Male A healthy lifestyle and preventive care can promote health and wellness. Preventive health guidelines for men include the following key practices:  A routine yearly physical is a good way to check with your caregiver about your health and preventative screening. It is a chance to share any concerns and updates on your health, and to receive a thorough exam.  Visit your dentist for a routine exam and preventative care every 6 months. Brush your teeth twice a day and floss once a day. Good oral hygiene prevents tooth decay and gum disease.  The frequency of eye exams is based on your age, health, family medical history, use of contact lenses, and other factors. Follow your caregiver's recommendations for frequency of eye exams.  Eat a healthy diet. Foods like vegetables, fruits, whole grains, low-fat dairy products, and lean protein foods contain the nutrients you need without too many calories. Decrease your intake of foods high in solid fats, added sugars, and salt. Eat the right amount of calories for you.Get information about a proper diet from your caregiver, if necessary.  Regular physical exercise is one of the most important things you can do for your health. Most adults should get at least 150 minutes of moderate-intensity exercise (any activity that increases your heart rate and causes you to sweat) each week. In addition, most adults need muscle-strengthening exercises on 2 or more days a week.  Maintain a healthy weight. The body mass index (BMI) is a screening tool to identify possible weight problems. It provides an estimate of body fat based on height and weight. Your caregiver can help determine your BMI, and can help you achieve or maintain a healthy weight.For adults 20 years and older:  A BMI below 18.5 is considered underweight.  A BMI of 18.5 to 24.9 is normal.  A BMI of 25  to 29.9 is considered overweight.  A BMI of 30 and above is considered obese.  Maintain normal blood lipids and cholesterol levels by exercising and minimizing your intake of saturated fat. Eat a balanced diet with plenty of fruit and vegetables. Blood tests for lipids and cholesterol should begin at age 72 and be repeated every 5 years. If your lipid or cholesterol levels are high, you are over 50, or you are a high risk for heart disease, you may need your cholesterol levels checked more frequently.Ongoing high lipid and cholesterol levels should be treated with medicines if diet and exercise are not effective.  If you smoke, find out from your caregiver how to quit. If you do not use tobacco, do not start.  Lung cancer screening is recommended for adults aged 2 80 years who are at high risk for developing lung cancer because of a history of smoking. Yearly low-dose computed tomography (CT) is recommended for people who have at least a 30-pack-year history of smoking and are a current smoker or have quit within the past 15 years. A pack year of smoking is smoking an average of 1 pack of cigarettes a day for 1 year (for example: 1 pack a day for 30 years or 2 packs a day for 15 years). Yearly screening should continue until the smoker has stopped smoking for at least 15 years. Yearly screening should also be stopped for people who develop a health problem that would prevent them from having lung cancer treatment.  If you choose to drink alcohol, do not exceed  2 drinks per day. One drink is considered to be 12 ounces (355 mL) of beer, 5 ounces (148 mL) of wine, or 1.5 ounces (44 mL) of liquor.  Avoid use of street drugs. Do not share needles with anyone. Ask for help if you need support or instructions about stopping the use of drugs.  High blood pressure causes heart disease and increases the risk of stroke. Your blood pressure should be checked at least every 1 to 2 years. Ongoing high blood  pressure should be treated with medicines, if weight loss and exercise are not effective.  If you are 36 to 52 years old, ask your caregiver if you should take aspirin to prevent heart disease.  Diabetes screening involves taking a blood sample to check your fasting blood sugar level. This should be done once every 3 years, after age 72, if you are within normal weight and without risk factors for diabetes. Testing should be considered at a younger age or be carried out more frequently if you are overweight and have at least 1 risk factor for diabetes.  Colorectal cancer can be detected and often prevented. Most routine colorectal cancer screening begins at the age of 46 and continues through age 75. However, your caregiver may recommend screening at an earlier age if you have risk factors for colon cancer. On a yearly basis, your caregiver may provide home test kits to check for hidden blood in the stool. Use of a small camera at the end of a tube, to directly examine the colon (sigmoidoscopy or colonoscopy), can detect the earliest forms of colorectal cancer. Talk to your caregiver about this at age 66, when routine screening begins. Direct examination of the colon should be repeated every 5 to 10 years through age 53, unless early forms of pre-cancerous polyps or small growths are found.  Hepatitis C blood testing is recommended for all people born from [birth year] through 1965 and any individual with known risks for hepatitis C.  Practice safe sex. Use condoms and avoid high-risk sexual practices to reduce the spread of sexually transmitted infections (STIs). STIs include gonorrhea, chlamydia, syphilis, trichomonas, herpes, HPV, and human immunodeficiency virus (HIV). Herpes, HIV, and HPV are viral illnesses that have no cure. They can result in disability, cancer, and death.  A one-time screening for abdominal aortic aneurysm (AAA) and surgical repair of large AAAs by sound wave imaging (ultrasonography)  is recommended for ages 27 to 70 years who are current or former smokers.  Healthy men should no longer receive prostate-specific antigen (PSA) blood tests as part of routine cancer screening. Consult with your caregiver about prostate cancer screening.  Testicular cancer screening is not recommended for adult males who have no symptoms. Screening includes self-exam, caregiver exam, and other screening tests. Consult with your caregiver about any symptoms you have or any concerns you have about testicular cancer.  Use sunscreen. Apply sunscreen liberally and repeatedly throughout the day. You should seek shade when your shadow is shorter than you. Protect yourself by wearing long sleeves, pants, a wide-brimmed hat, and sunglasses year round, whenever you are outdoors.  Once a month, do a whole body skin exam, using a mirror to look at the skin on your back. Notify your caregiver of new moles, moles that have irregular borders, moles that are larger than a pencil eraser, or moles that have changed in shape or color.  Stay current with required immunizations.  Influenza vaccine. All adults should be immunized every year.  Tetanus, diphtheria,  and acellular pertussis (Td, Tdap) vaccine. An adult who has not previously received Tdap or who does not know his vaccine status should receive 1 dose of Tdap. This initial dose should be followed by tetanus and diphtheria toxoids (Td) booster doses every 10 years. Adults with an unknown or incomplete history of completing a 3-dose immunization series with Td-containing vaccines should begin or complete a primary immunization series including a Tdap dose. Adults should receive a Td booster every 10 years.  Varicella vaccine. An adult without evidence of immunity to varicella should receive 2 doses or a second dose if he has previously received 1 dose.  Human papillomavirus (HPV) vaccine. Males aged 4 21 years who have not received the vaccine previously should  receive the 3-dose series. Males aged 68 26 years may be immunized. Immunization is recommended through the age of 16 years for any male who has sex with males and did not get any or all doses earlier. Immunization is recommended for any person with an immunocompromised condition through the age of 26 years if he did not get any or all doses earlier. During the 3-dose series, the second dose should be obtained 4 8 weeks after the first dose. The third dose should be obtained 24 weeks after the first dose and 16 weeks after the second dose.  Zoster vaccine. One dose is recommended for adults aged 58 years or older unless certain conditions are present.  Measles, mumps, and rubella (MMR) vaccine. Adults born before [birth year] generally are considered immune to measles and mumps. Adults born in [birth year] or later should have 1 or more doses of MMR vaccine unless there is a contraindication to the vaccine or there is laboratory evidence of immunity to each of the three diseases. A routine second dose of MMR vaccine should be obtained at least 28 days after the first dose for students attending postsecondary schools, health care workers, or international travelers. People who received inactivated measles vaccine or an unknown type of measles vaccine during 1963 1967 should receive 2 doses of MMR vaccine. People who received inactivated mumps vaccine or an unknown type of mumps vaccine before 1979 and are at high risk for mumps infection should consider immunization with 2 doses of MMR vaccine. Unvaccinated health care workers born before [birth year] who lack laboratory evidence of measles, mumps, or rubella immunity or laboratory confirmation of disease should consider measles and mumps immunization with 2 doses of MMR vaccine or rubella immunization with 1 dose of MMR vaccine.  Pneumococcal 13-valent conjugate (PCV13) vaccine. When indicated, a person who is uncertain of his immunization history and has no record of immunization  should receive the PCV13 vaccine. An adult aged 71 years or older who has certain medical conditions and has not been previously immunized should receive 1 dose of PCV13 vaccine. This PCV13 should be followed with a dose of pneumococcal polysaccharide (PPSV23) vaccine. The PPSV23 vaccine dose should be obtained at least 8 weeks after the dose of PCV13 vaccine. An adult aged 28 years or older who has certain medical conditions and previously received 1 or more doses of PPSV23 vaccine should receive 1 dose of PCV13. The PCV13 vaccine dose should be obtained 1 or more years after the last PPSV23 vaccine dose.  Pneumococcal polysaccharide (PPSV23) vaccine. When PCV13 is also indicated, PCV13 should be obtained first. All adults aged 38 years and older should be immunized. An adult younger than age 35 years who has certain medical conditions should be immunized. Any person who  resides in a nursing home or long-term care facility should be immunized. An adult smoker should be immunized. People with an immunocompromised condition and certain other conditions should receive both PCV13 and PPSV23 vaccines. People with human immunodeficiency virus (HIV) infection should be immunized as soon as possible after diagnosis. Immunization during chemotherapy or radiation therapy should be avoided. Routine use of PPSV23 vaccine is not recommended for American Indians, [address], or people younger than 65 years unless there are medical conditions that require PPSV23 vaccine. When indicated, people who have unknown immunization and have no record of immunization should receive PPSV23 vaccine. One-time revaccination 5 years after the first dose of PPSV23 is recommended for people aged 81 64 years who have chronic kidney failure, nephrotic syndrome, asplenia, or immunocompromised conditions. People who received 1 2 doses of PPSV23 before age 29 years should receive another dose of PPSV23 vaccine at age 63 years or later if at  least 5 years have passed since the previous dose. Doses of PPSV23 are not needed for people immunized with PPSV23 at or after age 30 years.  Meningococcal vaccine. Adults with asplenia or persistent complement component deficiencies should receive 2 doses of quadrivalent meningococcal conjugate (MenACWY-D) vaccine. The doses should be obtained at least 2 months apart. Microbiologists working with certain meningococcal bacteria, military recruits, people at risk during an outbreak, and people who travel to or live in countries with a high rate of meningitis should be immunized. A first-year college student up through age 71 years who is living in a residence hall should receive a dose if he did not receive a dose on or after his 16th birthday. Adults who have certain high-risk conditions should receive one or more doses of vaccine.  Hepatitis A vaccine. Adults who wish to be protected from this disease, have certain high-risk conditions, work with hepatitis A-infected animals, work in hepatitis A research labs, or travel to or work in countries with a high rate of hepatitis A should be immunized. Adults who were previously unvaccinated and who anticipate close contact with an international adoptee during the first 60 days after arrival in the [place] from a country with a high rate of hepatitis A should be immunized.  Hepatitis B vaccine. Adults who wish to be protected from this disease, have certain high-risk conditions, may be exposed to blood or other infectious body fluids, are household contacts or sex partners of hepatitis B positive people, are clients or workers in certain care facilities, or travel to or work in countries with a high rate of hepatitis B should be immunized.  Haemophilus influenzae type b (Hib) vaccine. A previously unvaccinated person with asplenia or sickle cell disease or having a scheduled splenectomy should receive 1 dose of Hib vaccine. Regardless of previous  immunization, a recipient of a hematopoietic stem cell transplant should receive a 3-dose series 6 12 months after his successful transplant. Hib vaccine is not recommended for adults with HIV infection. Preventive Service / Frequency Ages 28 to 34  Blood pressure check.** / Every 1 to 2 years.  Lipid and cholesterol check.** / Every 5 years beginning at age 77.  Hepatitis C blood test.** / For any individual with known risks for hepatitis C.  Skin self-exam. / Monthly.  Influenza vaccine. / Every year.  Tetanus, diphtheria, and acellular pertussis (Tdap, Td) vaccine.** / Consult your caregiver. 1 dose of Td every 10 years.  Varicella vaccine.** / Consult your caregiver.  HPV vaccine. / 3 doses over 6 months,  if 36 and younger.  Measles, mumps, rubella (MMR) vaccine.** / You need at least 1 dose of MMR if you were born in 1957 or later. You may also need a 2nd dose.  Pneumococcal 13-valent conjugate (PCV13) vaccine.** / Consult your caregiver.  Pneumococcal polysaccharide (PPSV23) vaccine.** / 1 to 2 doses if you smoke cigarettes or if you have certain conditions.  Meningococcal vaccine.** / 1 dose if you are age 54 to 30 years and a Orthoptist living in a residence hall, or have one of several medical conditions, you need to get vaccinated against meningococcal disease. You may also need additional booster doses.  Hepatitis A vaccine.** / Consult your caregiver.  Hepatitis B vaccine.** / Consult your caregiver.  Haemophilus influenzae type b (Hib) vaccine.** / Consult your caregiver. Ages 36 to 69  Blood pressure check.** / Every 1 to 2 years.  Lipid and cholesterol check.** / Every 5 years beginning at age 77.  Lung cancer screening. / Every year if you are aged 54 80 years and have a 30-pack-year history of smoking and currently smoke or have quit within the past 15 years. Yearly screening is stopped once you have quit smoking for at least 15 years or develop  a health problem that would prevent you from having lung cancer treatment.  Fecal occult blood test (FOBT) of stool. / Every year beginning at age 45 and continuing until age 50. You may not have to do this test if you get colonoscopy every 10 years.  Flexible sigmoidoscopy** or colonoscopy.** / Every 5 years for a flexible sigmoidoscopy or every 10 years for a colonoscopy beginning at age 49 and continuing until age 33.  Hepatitis C blood test.** / For all people born from [birth year] through 1965 and any individual with known risks for hepatitis C.  Skin self-exam. / Monthly.  Influenza vaccine. / Every year.  Tetanus, diphtheria, and acellular pertussis (Tdap/Td) vaccine.** / Consult your caregiver. 1 dose of Td every 10 years.  Varicella vaccine.** / Consult your caregiver.  Zoster vaccine.** / 1 dose for adults aged 81 years or older.  Measles, mumps, rubella (MMR) vaccine.** / You need at least 1 dose of MMR if you were born in 1957 or later. You may also need a 2nd dose.  Pneumococcal 13-valent conjugate (PCV13) vaccine.** / Consult your caregiver.  Pneumococcal polysaccharide (PPSV23) vaccine.** / 1 to 2 doses if you smoke cigarettes or if you have certain conditions.  Meningococcal vaccine.** / Consult your caregiver.  Hepatitis A vaccine.** / Consult your caregiver.  Hepatitis B vaccine.** / Consult your caregiver.  Haemophilus influenzae type b (Hib) vaccine.** / Consult your caregiver. Ages 28 and over  Blood pressure check.** / Every 1 to 2 years.  Lipid and cholesterol check.**/ Every 5 years beginning at age 52.  Lung cancer screening. / Every year if you are aged 10 80 years and have a 30-pack-year history of smoking and currently smoke or have quit within the past 15 years. Yearly screening is stopped once you have quit smoking for at least 15 years or develop a health problem that would prevent you from having lung cancer treatment.  Fecal occult blood test (FOBT) of  stool. / Every year beginning at age 17 and continuing until age 53. You may not have to do this test if you get colonoscopy every 10 years.  Flexible sigmoidoscopy** or colonoscopy.** / Every 5 years for a flexible sigmoidoscopy or every 10 years for a colonoscopy beginning at  age 18 and continuing until age 45.  Hepatitis C blood test.** / For all people born from [birth year] through 1965 and any individual with known risks for hepatitis C.  Abdominal aortic aneurysm (AAA) screening.** / A one-time screening for ages 46 to 28 years who are current or former smokers.  Skin self-exam. / Monthly.  Influenza vaccine. / Every year.  Tetanus, diphtheria, and acellular pertussis (Tdap/Td) vaccine.** / 1 dose of Td every 10 years.  Varicella vaccine.** / Consult your caregiver.  Zoster vaccine.** / 1 dose for adults aged 65 years or older.  Pneumococcal 13-valent conjugate (PCV13) vaccine.** / Consult your caregiver.  Pneumococcal polysaccharide (PPSV23) vaccine.** / 1 dose for all adults aged 69 years and older.  Meningococcal vaccine.** / Consult your caregiver.  Hepatitis A vaccine.** / Consult your caregiver.  Hepatitis B vaccine.** / Consult your caregiver.  Haemophilus influenzae type b (Hib) vaccine.** / Consult your caregiver. **Family history and personal history of risk and conditions may change your caregiver's recommendations. Document Released: 05/19/2001 Document Revised: 07/18/2012 Document Reviewed: 08/18/2010 [facility] Patient Information 2014 [place], [place].

## 2013-02-16 NOTE — Progress Notes (Signed)
Pre visit review using our clinic review tool, if applicable. No additional management support is needed unless otherwise documented below in the visit note/SLS  

## 2013-02-19 ENCOUNTER — Encounter: Payer: Self-pay | Admitting: Family Medicine

## 2013-02-19 DIAGNOSIS — Z0000 Encounter for general adult medical examination without abnormal findings: Secondary | ICD-10-CM | POA: Insufficient documentation

## 2013-02-19 HISTORY — DX: Encounter for general adult medical examination without abnormal findings: Z00.00

## 2013-02-19 NOTE — Assessment & Plan Note (Signed)
Following with endocrinology, has a insulin pump and has an appt soon. His sugars have been very labile, encouraged to eat small, frequent meals with lean proteins

## 2013-02-19 NOTE — Assessment & Plan Note (Signed)
Tolerating Simvastatin, avoid trans fats, increase exercise as tolerated.

## 2013-02-19 NOTE — Assessment & Plan Note (Signed)
No need for alubterol in a very long time

## 2013-02-19 NOTE — Assessment & Plan Note (Signed)
Encouraged heart healthy diet, adequate sleep and exercise, has already had flu shot for the year. Colonoscopy was 2013

## 2013-02-19 NOTE — Assessment & Plan Note (Signed)
Well controlled, no changes 

## 2013-08-14 ENCOUNTER — Telehealth: Payer: Self-pay | Admitting: Family Medicine

## 2013-08-14 ENCOUNTER — Encounter: Payer: Self-pay | Admitting: Family Medicine

## 2013-08-14 ENCOUNTER — Ambulatory Visit (INDEPENDENT_AMBULATORY_CARE_PROVIDER_SITE_OTHER): Payer: BC Managed Care – PPO | Admitting: Family Medicine

## 2013-08-14 VITALS — BP 142/78 | HR 70 | Temp 98.0°F | Ht 67.5 in | Wt 180.1 lb

## 2013-08-14 DIAGNOSIS — I10 Essential (primary) hypertension: Secondary | ICD-10-CM

## 2013-08-14 DIAGNOSIS — R6882 Decreased libido: Secondary | ICD-10-CM | POA: Insufficient documentation

## 2013-08-14 DIAGNOSIS — J45909 Unspecified asthma, uncomplicated: Secondary | ICD-10-CM

## 2013-08-14 DIAGNOSIS — Z23 Encounter for immunization: Secondary | ICD-10-CM

## 2013-08-14 DIAGNOSIS — E109 Type 1 diabetes mellitus without complications: Secondary | ICD-10-CM

## 2013-08-14 DIAGNOSIS — B269 Mumps without complication: Secondary | ICD-10-CM | POA: Insufficient documentation

## 2013-08-14 DIAGNOSIS — B019 Varicella without complication: Secondary | ICD-10-CM | POA: Insufficient documentation

## 2013-08-14 MED ORDER — SIMVASTATIN 40 MG PO TABS: 40.0000 mg | ORAL_TABLET | Freq: Every day | ORAL | Status: DC

## 2013-08-14 MED ORDER — TETANUS-DIPHTH-ACELL PERTUSSIS 5-2.5-18.5 LF-MCG/0.5 IM SUSP: 0.5000 mL | Freq: Once | INTRAMUSCULAR | Status: DC

## 2013-08-14 MED ORDER — LISINOPRIL 20 MG PO TABS: 20.0000 mg | ORAL_TABLET | Freq: Every day | ORAL | Status: DC

## 2013-08-14 NOTE — Progress Notes (Signed)
Patient ID: Scott LeylandRonald Adkins, male   DOB: February 12, 1961, 53 y.o.   MRN: [id number] Scott LeylandRonald Adkins [id number] February 12, 1961 08/14/2013      Progress Note-Follow Up  Subjective  Chief Complaint  Chief Complaint  Patient presents with  . Follow-up    6 month  . Injections    tdap    HPI  Patient is a 53 year old male in today for routine medical care. Well. Major complaint is of low libido and some mild ED which is new. No increased stressors recently or other concerns. No acute illness. Denies CP/palp/SOB/HA/congestion/fevers/GI or GU c/o. Taking meds as prescribed. No polyuria or polydipsia  Past Medical History  Diagnosis Date  . Hyperlipidemia   . Diabetes mellitus type I     uses insulin pump// managed by Dr. Casimiro NeedleMichael Adkins  . Hypertension   . Arthritis   . Injury of right rotator cuff 08/16/2012    Has been seen by [facility]  . Preventative health care 02/19/2013  . Chicken pox as a child  . Mumps as a child    Past Surgical History  Procedure Laterality Date  . Refractive surgery  [facility]  . Rotator cuff repair  2004    left    Family History  Problem Relation Age of Onset  . Hypertension    . Stroke    . Colon cancer Neg Hx   . Prostate cancer Neg Hx   . Breast cancer Neg Hx   . Rectal cancer Neg Hx   . Hypertension Mother   . Stroke Father   . Hypertension Father   . Hyperlipidemia Father   . Hypertension Brother   . Hyperlipidemia Brother   . Mental illness Brother     depression  . Heart disease Maternal Grandmother     MI  . Heart disease Maternal Grandfather     MI  . Hyperlipidemia Brother   . Hypertension Brother     History   Social History  . Marital Status: Married    Spouse Name: N/A    Number of Children: N/A  . Years of Education: N/A   Occupational History  . Not on file.   Social History Main Topics  . Smoking status: Never Smoker   . Smokeless tobacco: Never Used  . Alcohol Use: 0.6 oz/week    1 Shots of liquor per  week     Comment: Socially  . Drug Use: No  . Sexual Activity: Not on file     Comment: lives with wife, daughter and mother in law, no dietary restrictions   Other Topics Concern  . Not on file   Social History Narrative   Married 14 yrs   2 daughters; 1 son   Occupation: [employer]          Current Outpatient Prescriptions on File Prior to Visit  Medication Sig Dispense Refill  . aspirin 81 MG tablet Take 81 mg by mouth daily.      . Cholecalciferol (VITAMIN D3) 1000 UNITS CAPS Take 2,000 Units by mouth daily.      Marland Kitchen. NOVOLOG 100 UNIT/ML injection Novolog insulin per insulin pump       No current facility-administered medications on file prior to visit.    No Known Allergies  Review of Systems  Review of Systems  Constitutional: Negative for fever and malaise/fatigue.  HENT: Negative for congestion.   Eyes: Negative for discharge.  Respiratory: Negative for shortness of breath.  Cardiovascular: Negative for chest pain, palpitations and leg swelling.  Gastrointestinal: Negative for nausea, abdominal pain and diarrhea.  Genitourinary: Negative for dysuria.  Musculoskeletal: Negative for falls.  Skin: Negative for rash.  Neurological: Negative for loss of consciousness and headaches.  Endo/Heme/Allergies: Negative for polydipsia.  Psychiatric/Behavioral: Negative for depression and suicidal ideas. The patient is not nervous/anxious and does not have insomnia.     Objective  BP 142/78  Pulse 70  Temp(Src) 98 F (36.7 C) (Oral)  Ht 5' 7.5" (1.715 m)  Wt 180 lb 1.3 oz (81.684 kg)  BMI 27.77 kg/m2  SpO2 99%  Physical Exam  Physical Exam  Constitutional: He is oriented to person, place, and time and well-developed, well-nourished, and in no distress. No distress.  HENT:  Head: Normocephalic and atraumatic.  Eyes: Conjunctivae are normal.  Neck: Neck supple. No thyromegaly present.  Cardiovascular: Normal rate, regular rhythm and normal heart sounds.    No murmur heard. Pulmonary/Chest: Effort normal and breath sounds normal. No respiratory distress.  Abdominal: He exhibits no distension and no mass. There is no tenderness.  Musculoskeletal: He exhibits no edema.  Neurological: He is alert and oriented to person, place, and time.  Skin: Skin is warm.  Psychiatric: Memory, affect and judgment normal.    Lab Results  Component Value Date   TSH 1.755 02/16/2013   Lab Results  Component Value Date   WBC 5.7 02/16/2013   HGB 14.1 02/16/2013   HCT 42.5 02/16/2013   MCV 87.1 02/16/2013   PLT 178 02/16/2013   Lab Results  Component Value Date   CREATININE 0.99 02/16/2013   BUN 15 02/16/2013   NA 138 02/16/2013   K 5.1 02/16/2013   CL 100 02/16/2013   CO2 31 02/16/2013   Lab Results  Component Value Date   ALT 14 02/16/2013   AST 15 02/16/2013   ALKPHOS 70 02/16/2013   BILITOT 0.8 02/16/2013   Lab Results  Component Value Date   CHOL 209* 02/16/2013   Lab Results  Component Value Date   HDL 61 02/16/2013   Lab Results  Component Value Date   LDLCALC 135* 02/16/2013   Lab Results  Component Value Date   TRIG 67 02/16/2013   Lab Results  Component Value Date   CHOLHDL 3.4 02/16/2013     Assessment & Plan  HYPERTENSION Well controlled, no changes to meds. Encouraged heart healthy diet such as the DASH diet and exercise as tolerated.   Unspecified asthma(493.90) No recent flares.   DIABETES MELLITUS, TYPE I No recent change in meds, following with endocrinology  Low libido Check Testosterone and treat with shots if low

## 2013-08-14 NOTE — Assessment & Plan Note (Signed)
Check Testosterone and treat with shots if low

## 2013-08-14 NOTE — Assessment & Plan Note (Signed)
No recent flares 

## 2013-08-14 NOTE — Telephone Encounter (Signed)
Relevant patient education assigned to patient using Emmi. ° °

## 2013-08-14 NOTE — Progress Notes (Signed)
Pre visit review using our clinic review tool, if applicable. No additional management support is needed unless otherwise documented below in the visit note. 

## 2013-08-14 NOTE — Patient Instructions (Signed)

## 2013-08-14 NOTE — Assessment & Plan Note (Signed)
No recent change in meds, following with endocrinology

## 2013-08-14 NOTE — Assessment & Plan Note (Signed)
Well controlled, no changes to meds. Encouraged heart healthy diet such as the DASH diet and exercise as tolerated.  °

## 2013-08-15 LAB — TESTOSTERONE: TESTOSTERONE: 509 ng/dL (ref 300–890)

## 2013-08-17 ENCOUNTER — Telehealth: Payer: Self-pay | Admitting: Family Medicine

## 2013-08-17 NOTE — Telephone Encounter (Signed)
[facility]

## 2013-09-05 ENCOUNTER — Encounter: Payer: Self-pay | Admitting: Nurse Practitioner

## 2013-09-05 ENCOUNTER — Ambulatory Visit (INDEPENDENT_AMBULATORY_CARE_PROVIDER_SITE_OTHER): Payer: BC Managed Care – PPO | Admitting: Nurse Practitioner

## 2013-09-05 VITALS — BP 132/76 | HR 68 | Temp 98.2°F | Resp 18 | Ht 67.5 in | Wt 175.0 lb

## 2013-09-05 DIAGNOSIS — M549 Dorsalgia, unspecified: Secondary | ICD-10-CM | POA: Insufficient documentation

## 2013-09-05 LAB — POCT URINALYSIS DIPSTICK
BILIRUBIN UA: NEGATIVE
Blood, UA: NEGATIVE
GLUCOSE UA: 100
Leukocytes, UA: NEGATIVE
Nitrite, UA: NEGATIVE
Protein, UA: NEGATIVE
SPEC GRAV UA: 1.015
Urobilinogen, UA: 1
pH, UA: 7

## 2013-09-05 NOTE — Patient Instructions (Signed)
I do not think this recurrent pain is related to kidneys-urine looks good. It think it is musculoskeletal. Stretching will likely help as well as anti-inflammatory medication like ibuprophen. You can take 200 to 600 mg twice daily with food. Back stretches should be performed twice daily and should take at least 10 minutes to complete. If you do not get any relief, please let us know and we can image your lower back and or send you to back specialist.  Back Exercises Back exercises help treat and prevent back injuries. The goal of back exercises is to increase the strength of your abdominal and back muscles and the flexibility of your back. These exercises should be started when you no longer have back pain. Back exercises include:  Pelvic Tilt. Lie on your back with your knees bent. Tilt your pelvis until the lower part of your back is against the floor. Hold this position 5 to 10 sec and repeat 5 to 10 times.  Knee to Chest. Pull first 1 knee up against your chest and hold for 20 to 30 seconds, repeat this with the other knee, and then both knees. This may be done with the other leg straight or bent, whichever feels better.  Sit-Ups or Curl-Ups. Bend your knees 90 degrees. Start with tilting your pelvis, and do a partial, slow sit-up, lifting your trunk only 30 to 45 degrees off the floor. Take at least 2 to 3 seconds for each sit-up. Do not do sit-ups with your knees out straight. If partial sit-ups are difficult, simply do the above but with only tightening your abdominal muscles and holding it as directed.  Hip-Lift. Lie on your back with your knees flexed 90 degrees. Push down with your feet and shoulders as you raise your hips a couple inches off the floor; hold for 10 seconds, repeat 5 to 10 times. Do not overdo your exercises, especially in the beginning. Exercises may cause you some mild back discomfort which lasts for a few minutes; however, if the pain is more severe, or lasts for more than  15 minutes, do not continue exercises until you see your caregiver. Improvement with exercise therapy for back problems is slow.  See your caregivers for assistance with developing a proper back exercise program. Document Released: 04/30/2004 Document Revised: 06/15/2011 Document Reviewed: 01/22/2011 [facility] Patient Information 2014 [place], [place].

## 2013-09-05 NOTE — Progress Notes (Signed)
Subjective:    Scott Adkins is a 53 y.o. male who presents for evaluation of recurrent low back pain. The patient has had recurrent self limited episodes of low back pain in the past. Symptoms have been present for 8 months and are unchanged.  Onset was related to / precipitated by no known injury. The pain is located in the right lumbar area and does not radiate. The pain is described as aching and occurs all day. He rates his pain as moderate. Symptoms are exacerbated by nothing in particular. Symptoms are improved by nothing-he is not using OTC meds as he does not like to take meds. He was seen in ED 8 mos ago & given morphine & toradol for pain with complete relief. He was prescribed muscle relaxer, but did not fill script.  He is worried that pain is coming from kidneys. He denies changes in urine stream, hematuria, groin or abdominal pain, or dysuria.   The following portions of the patient's history were reviewed and updated as appropriate: allergies, current medications, past medical history, past social history, past surgical history and problem list.  Review of Systems Pertinent items are noted in HPI.    Objective:   Inspection and palpation: inspection of back is normal, no tenderness noted. Muscle tone and ROM exam: muscle tone normal without spasm, limited range of motion with pain. Straight leg raise: negative at 20-45 degrees degrees bilaterally. Lungs: clear BS bilat Heart: RRR, no murmur     Assessment:  1. Back pain without radiation DD: arthritis-possibly spondyloarthropathy  - Urinalysis, Routine w reflex microscopic - POCT urinalysis dipstick-trace ketones, no blood

## 2013-09-05 NOTE — Assessment & Plan Note (Signed)
Lumbar region. Right sided. No radiation. Limited forward & lateral flexion of spine. LROM in bilat hips. Suspect arthritis or spondyloarthropathy. No blood in urine, No CVA tenderness. No spinal tenderness. Cannot reproduce pain.  Discussed conservative treatment: stretches, NSAIDS, xrays vs referral to back specialist. Pt does not want xrays or referral now. Wants to try stretches. He is relieved that pain does not seem to be coming from kidneys. Adv to let us know if no improvement.

## 2013-09-06 LAB — URINALYSIS, ROUTINE W REFLEX MICROSCOPIC
BILIRUBIN URINE: NEGATIVE
Hgb urine dipstick: NEGATIVE
KETONES UR: NEGATIVE
LEUKOCYTES UA: NEGATIVE
NITRITE: NEGATIVE
Specific Gravity, Urine: 1.01 (ref 1.000–1.030)
Total Protein, Urine: NEGATIVE
UROBILINOGEN UA: 0.2 (ref 0.0–1.0)
Urine Glucose: 100 — AB
pH: 7 (ref 5.0–8.0)

## 2013-09-14 ENCOUNTER — Telehealth: Payer: Self-pay

## 2013-09-14 NOTE — Telephone Encounter (Signed)
Relevant patient education assigned to patient using Emmi. ° °

## 2013-10-18 ENCOUNTER — Telehealth: Payer: Self-pay

## 2013-10-18 DIAGNOSIS — E785 Hyperlipidemia, unspecified: Secondary | ICD-10-CM

## 2013-10-18 DIAGNOSIS — E109 Type 1 diabetes mellitus without complications: Secondary | ICD-10-CM

## 2013-10-18 NOTE — Telephone Encounter (Signed)
Diabetic bundle mychart message sent and labs ordered for A1C and LDL

## 2014-02-07 ENCOUNTER — Ambulatory Visit (INDEPENDENT_AMBULATORY_CARE_PROVIDER_SITE_OTHER): Payer: BC Managed Care – PPO | Admitting: Physician Assistant

## 2014-02-07 ENCOUNTER — Encounter: Payer: Self-pay | Admitting: Physician Assistant

## 2014-02-07 VITALS — BP 127/69 | HR 81 | Temp 99.1°F | Wt 166.6 lb

## 2014-02-07 DIAGNOSIS — J0100 Acute maxillary sinusitis, unspecified: Secondary | ICD-10-CM | POA: Insufficient documentation

## 2014-02-07 MED ORDER — AMOXICILLIN-POT CLAVULANATE 875-125 MG PO TABS: 1.0000 | ORAL_TABLET | Freq: Two times a day (BID) | ORAL | Status: DC

## 2014-02-07 NOTE — Progress Notes (Signed)
History of Present Illness: Scott Adkins is a 53 y.o. male who present to the clinic today complaining of sinus pressure, sinus pain and nasal congestion. Patient endorses ear pain and tooth pain. Patient endorses fever and malaise. Patient denies SOB or pleuritic chest pain.  Denies recent travel or sick contact.   History: Past Medical History  Diagnosis Date  . Hyperlipidemia   . Diabetes mellitus type I     uses insulin pump// managed by Dr. Casimiro NeedleMichael Altheimer  . Hypertension   . Arthritis   . Injury of right rotator cuff 08/16/2012    Has been seen by [facility]  . Preventative health care 02/19/2013  . Chicken pox as a child  . Mumps as a child   Current outpatient prescriptions: AFLURIA PRESERVATIVE FREE 0.5 ML SUSY, Inject 6 Units as directed as directed., Disp: , Rfl: 0;  aspirin 81 MG tablet, Take 81 mg by mouth daily., Disp: , Rfl: ;  Cholecalciferol (VITAMIN D3) 1000 UNITS CAPS, Take 2,000 Units by mouth daily., Disp: , Rfl: ;  lisinopril (PRINIVIL,ZESTRIL) 20 MG tablet, Take 1 tablet (20 mg total) by mouth daily., Disp: 90 tablet, Rfl: 1 simvastatin (ZOCOR) 40 MG tablet, Take 1 tablet (40 mg total) by mouth daily., Disp: 90 tablet, Rfl: 1;  amoxicillin-clavulanate (AUGMENTIN) 875-125 MG per tablet, Take 1 tablet by mouth 2 (two) times daily., Disp: 20 tablet, Rfl: 0;  NOVOLOG 100 UNIT/ML injection, Novolog insulin per insulin pump, Disp: , Rfl:  No Known Allergies Family History  Problem Relation Age of Onset  . Hypertension    . Stroke    . Colon cancer Neg Hx   . Prostate cancer Neg Hx   . Breast cancer Neg Hx   . Rectal cancer Neg Hx   . Hypertension Mother   . Stroke Father   . Hypertension Father   . Hyperlipidemia Father   . Hypertension Brother   . Hyperlipidemia Brother   . Mental illness Brother     depression  . Heart disease Maternal Grandmother     MI  . Heart disease Maternal Grandfather     MI  . Hyperlipidemia Brother   . Hypertension Brother     History   Social History  . Marital Status: Married    Spouse Name: N/A    Number of Children: N/A  . Years of Education: N/A   Social History Main Topics  . Smoking status: Never Smoker   . Smokeless tobacco: Never Used  . Alcohol Use: 0.6 oz/week    1 Shots of liquor per week     Comment: Socially  . Drug Use: No  . Sexual Activity: None     Comment: lives with wife, daughter and mother in law, no dietary restrictions   Other Topics Concern  . None   Social History Narrative   Married 14 yrs   2 daughters; 1 son   Occupation: [employer]         Review of Systems: See HPI.  All other ROS are negative.  Physical Examination: BP 127/69 mmHg  Pulse 81  Temp(Src) 99.1 F (37.3 C) (Oral)  Wt 166 lb 9.6 oz (75.569 kg)  SpO2 99%  General appearance: alert, cooperative and appears stated age Head: Normocephalic, without obvious abnormality, atraumatic, sinuses tender to percussion Eyes: conjunctivae/corneas clear. PERRL, EOM's intact. Fundi benign. Ears: normal TM's and external ear canals both ears Nose: moderate congestion, turbinates swollen, sinus tenderness bilateral Throat: lips, mucosa, and tongue  normal; teeth and gums normal Neck: no adenopathy, no carotid bruit, no JVD, supple, symmetrical, trachea midline and thyroid not enlarged, symmetric, no tenderness/mass/nodules Lungs: clear to auscultation bilaterally Chest wall: no tenderness  Assessment/Plan: Acute maxillary sinusitis Rx Augmentin 875/125 BID x 10 days.  Increase fluids.  Rest.  Saline nasal spray.  Probiotic.  Mucinex as directed.  Humidifier in bedroom.  Call or return to clinic if symptoms are not improving.

## 2014-02-07 NOTE — Patient Instructions (Signed)
Please take antibiotic as directed.  Increase fluid intake.  Use Saline nasal spray.  Take a daily multivitamin. Use Mucinex for cngestion.  Place a humidifier in the bedroom.  Please call or return clinic if symptoms are not improving.  Sinusitis Sinusitis is redness, soreness, and swelling (inflammation) of the paranasal sinuses. Paranasal sinuses are air pockets within the bones of your face (beneath the eyes, the middle of the forehead, or above the eyes). In healthy paranasal sinuses, mucus is able to drain out, and air is able to circulate through them by way of your nose. However, when your paranasal sinuses are inflamed, mucus and air can become trapped. This can allow bacteria and other germs to grow and cause infection. Sinusitis can develop quickly and last only a short time (acute) or continue over a long period (chronic). Sinusitis that lasts for more than 12 weeks is considered chronic.  CAUSES  Causes of sinusitis include:  Allergies.  Structural abnormalities, such as displacement of the cartilage that separates your nostrils (deviated septum), which can decrease the air flow through your nose and sinuses and affect sinus drainage.  Functional abnormalities, such as when the small hairs (cilia) that line your sinuses and help remove mucus do not work properly or are not present. SYMPTOMS  Symptoms of acute and chronic sinusitis are the same. The primary symptoms are pain and pressure around the affected sinuses. Other symptoms include:  Upper toothache.  Earache.  Headache.  Bad breath.  Decreased sense of smell and taste.  A cough, which worsens when you are lying flat.  Fatigue.  Fever.  Thick drainage from your nose, which often is green and may contain pus (purulent).  Swelling and warmth over the affected sinuses. DIAGNOSIS  Your caregiver will perform a physical exam. During the exam, your caregiver may:  Look in your nose for signs of abnormal growths in  your nostrils (nasal polyps).  Tap over the affected sinus to check for signs of infection.  View the inside of your sinuses (endoscopy) with a special imaging device with a light attached (endoscope), which is inserted into your sinuses. If your caregiver suspects that you have chronic sinusitis, one or more of the following tests may be recommended:  Allergy tests.  Nasal culture A sample of mucus is taken from your nose and sent to a lab and screened for bacteria.  Nasal cytology A sample of mucus is taken from your nose and examined by your caregiver to determine if your sinusitis is related to an allergy. TREATMENT  Most cases of acute sinusitis are related to a viral infection and will resolve on their own within 10 days. Sometimes medicines are prescribed to help relieve symptoms (pain medicine, decongestants, nasal steroid sprays, or saline sprays).  However, for sinusitis related to a bacterial infection, your caregiver will prescribe antibiotic medicines. These are medicines that will help kill the bacteria causing the infection.  Rarely, sinusitis is caused by a fungal infection. In theses cases, your caregiver will prescribe antifungal medicine. For some cases of chronic sinusitis, surgery is needed. Generally, these are cases in which sinusitis recurs more than 3 times per year, despite other treatments. HOME CARE INSTRUCTIONS   Drink plenty of water. Water helps thin the mucus so your sinuses can drain more easily.  Use a humidifier.  Inhale steam 3 to 4 times a day (for example, sit in the bathroom with the shower running).  Apply a warm, moist washcloth to your face 3 to  4 times a day, or as directed by your caregiver.  Use saline nasal sprays to help moisten and clean your sinuses.  Take over-the-counter or prescription medicines for pain, discomfort, or fever only as directed by your caregiver. SEEK IMMEDIATE MEDICAL CARE IF:  You have increasing pain or severe  headaches.  You have nausea, vomiting, or drowsiness.  You have swelling around your face.  You have vision problems.  You have a stiff neck.  You have difficulty breathing. MAKE SURE YOU:   Understand these instructions.  Will watch your condition.  Will get help right away if you are not doing well or get worse. Document Released: 03/23/2005 Document Revised: 06/15/2011 Document Reviewed: 04/07/2011 [facility] Patient Information 2014 [place], [place].

## 2014-02-07 NOTE — Assessment & Plan Note (Signed)
Rx Augmentin 875/125 BID x 10 days.  Increase fluids.  Rest.  Saline nasal spray.  Probiotic.  Mucinex as directed.  Humidifier in bedroom.  Call or return to clinic if symptoms are not improving.

## 2014-02-07 NOTE — Progress Notes (Signed)
Pre visit review using our clinic review tool, if applicable. No additional management support is needed unless otherwise documented below in the visit note. 

## 2014-02-15 ENCOUNTER — Ambulatory Visit (INDEPENDENT_AMBULATORY_CARE_PROVIDER_SITE_OTHER): Payer: BC Managed Care – PPO | Admitting: Family Medicine

## 2014-02-15 ENCOUNTER — Encounter: Payer: Self-pay | Admitting: Family Medicine

## 2014-02-15 VITALS — BP 132/80 | HR 61 | Temp 98.1°F | Ht 67.5 in | Wt 171.0 lb

## 2014-02-15 DIAGNOSIS — E1065 Type 1 diabetes mellitus with hyperglycemia: Secondary | ICD-10-CM

## 2014-02-15 DIAGNOSIS — E785 Hyperlipidemia, unspecified: Secondary | ICD-10-CM | POA: Diagnosis not present

## 2014-02-15 DIAGNOSIS — Z0000 Encounter for general adult medical examination without abnormal findings: Secondary | ICD-10-CM | POA: Diagnosis not present

## 2014-02-15 DIAGNOSIS — I10 Essential (primary) hypertension: Secondary | ICD-10-CM

## 2014-02-15 DIAGNOSIS — M25579 Pain in unspecified ankle and joints of unspecified foot: Secondary | ICD-10-CM | POA: Diagnosis not present

## 2014-02-15 LAB — CBC
HEMATOCRIT: 41.2 % (ref 39.0–52.0)
HEMOGLOBIN: 13.3 g/dL (ref 13.0–17.0)
MCHC: 32.2 g/dL (ref 30.0–36.0)
MCV: 87.9 fl (ref 78.0–100.0)
Platelets: 262 10*3/uL (ref 150.0–400.0)
RBC: 4.69 Mil/uL (ref 4.22–5.81)
RDW: 12.1 % (ref 11.5–15.5)
WBC: 8.9 10*3/uL (ref 4.0–10.5)

## 2014-02-15 LAB — RENAL FUNCTION PANEL
Albumin: 3.5 g/dL (ref 3.5–5.2)
BUN: 16 mg/dL (ref 6–23)
CHLORIDE: 102 meq/L (ref 96–112)
CO2: 27 mEq/L (ref 19–32)
CREATININE: 0.8 mg/dL (ref 0.4–1.5)
Calcium: 9.6 mg/dL (ref 8.4–10.5)
GFR: 102.76 mL/min (ref >60.00)
Glucose, Bld: 187 mg/dL — ABNORMAL HIGH (ref 70–99)
Phosphorus: 2.1 mg/dL — ABNORMAL LOW (ref 2.3–4.6)
Potassium: 4.5 mEq/L (ref 3.5–5.1)
SODIUM: 138 meq/L (ref 135–145)

## 2014-02-15 LAB — HEPATIC FUNCTION PANEL
ALT: 14 U/L (ref 0–53)
AST: 20 U/L (ref 0–37)
Albumin: 3.5 g/dL (ref 3.5–5.2)
Alkaline Phosphatase: 79 U/L (ref 39–117)
Bilirubin, Direct: 0 mg/dL (ref 0.0–0.3)
Total Bilirubin: 0.5 mg/dL (ref 0.2–1.2)
Total Protein: 7.4 g/dL (ref 6.0–8.3)

## 2014-02-15 LAB — LIPID PANEL
CHOL/HDL RATIO: 4
CHOLESTEROL: 207 mg/dL — AB (ref 0–200)
HDL: 52.8 mg/dL (ref >39.00)
LDL Cholesterol: 141 mg/dL — ABNORMAL HIGH (ref 0–99)
NonHDL: 154.2
Triglycerides: 67 mg/dL (ref 0.0–149.0)
VLDL: 13.4 mg/dL (ref 0.0–40.0)

## 2014-02-15 LAB — TSH: TSH: 0.86 u[IU]/mL (ref 0.35–4.50)

## 2014-02-15 LAB — HEMOGLOBIN A1C: Hgb A1c MFr Bld: 8 % — ABNORMAL HIGH (ref 4.6–6.5)

## 2014-02-15 LAB — PSA: PSA: 0.61 ng/mL (ref 0.10–4.00)

## 2014-02-15 NOTE — Progress Notes (Signed)
Pre visit review using our clinic review tool, if applicable. No additional management support is needed unless otherwise documented below in the visit note. 

## 2014-02-15 NOTE — Patient Instructions (Addendum)
Curcumen/Turmeric cap daily Tylenol ES 500 mg tabs 1 tab po twice daily Salon Pas gel three x a day. Almonds every hour   Basic Carbohydrate Counting for Diabetes Mellitus Carbohydrate counting is a method for keeping track of the amount of carbohydrates you eat. Eating carbohydrates naturally increases the level of sugar (glucose) in your blood, so it is important for you to know the amount that is okay for you to have in every meal. Carbohydrate counting helps keep the level of glucose in your blood within normal limits. The amount of carbohydrates allowed is different for every person. A dietitian can help you calculate the amount that is right for you. Once you know the amount of carbohydrates you can have, you can count the carbohydrates in the foods you want to eat. Carbohydrates are found in the following foods:  Grains, such as breads and cereals.  Dried beans and soy products.  Starchy vegetables, such as potatoes, peas, and corn.  Fruit and fruit juices.  Milk and yogurt.  Sweets and snack foods, such as cake, cookies, candy, chips, soft drinks, and fruit drinks. CARBOHYDRATE COUNTING There are two ways to count the carbohydrates in your food. You can use either of the methods or a combination of both. Reading the "Nutrition Facts" on Packaged Food The "Nutrition Facts" is an area that is included on the labels of almost all packaged food and beverages in the [place]. It includes the serving size of that food or beverage and information about the nutrients in each serving of the food, including the grams (g) of carbohydrate per serving.  Decide the number of servings of this food or beverage that you will be able to eat or drink. Multiply that number of servings by the number of grams of carbohydrate that is listed on the label for that serving. The total will be the amount of carbohydrates you will be having when you eat or drink this food or beverage. Learning Standard  Serving Sizes of Food When you eat food that is not packaged or does not include "Nutrition Facts" on the label, you need to measure the servings in order to count the amount of carbohydrates.A serving of most carbohydrate-rich foods contains about 15 g of carbohydrates. The following list includes serving sizes of carbohydrate-rich foods that provide 15 g ofcarbohydrate per serving:   1 slice of bread (1 oz) or 1 six-inch tortilla.    of a hamburger bun or English muffin.  4-6 crackers.   cup unsweetened dry cereal.    cup hot cereal.   cup rice or pasta.    cup mashed potatoes or  of a large baked potato.  1 cup fresh fruit or one small piece of fruit.    cup canned or frozen fruit or fruit juice.  1 cup milk.   cup plain fat-free yogurt or yogurt sweetened with artificial sweeteners.   cup cooked dried beans or starchy vegetable, such as peas, corn, or potatoes.  Decide the number of standard-size servings that you will eat. Multiply that number of servings by 15 (the grams of carbohydrates in that serving). For example, if you eat 2 cups of strawberries, you will have eaten 2 servings and 30 g of carbohydrates (2 servings x 15 g = 30 g). For foods such as soups and casseroles, in which more than one food is mixed in, you will need to count the carbohydrates in each food that is included. EXAMPLE OF CARBOHYDRATE COUNTING Sample Dinner  3 oz chicken breast.   cup of brown rice.   cup of corn.  1 cup milk.   1 cup strawberries with sugar-free whipped topping.  Carbohydrate Calculation Step 1: Identify the foods that contain carbohydrates:   Rice.   Corn.   Milk.   Strawberries. Step 2:Calculate the number of servings eaten of each:   2 servings of rice.   1 serving of corn.   1 serving of milk.   1 serving of strawberries. Step 3: Multiply each of those number of servings by 15 g:   2 servings of rice x 15 g = 30 g.   1  serving of corn x 15 g = 15 g.   1 serving of milk x 15 g = 15 g.   1 serving of strawberries x 15 g = 15 g. Step 4: Add together all of the amounts to find the total grams of carbohydrates eaten: 30 g + 15 g + 15 g + 15 g = 75 g. Document Released: 03/23/2005 Document Revised: 08/07/2013 Document Reviewed: 02/17/2013 [employer] Patient Information 2015 [employer]. This information is not intended to replace advice given to you by your health care provider. Make sure you discuss any questions you have with your health care provider.  

## 2014-02-15 NOTE — Progress Notes (Signed)
Patient ID: Scott Adkins, male   DOB: [birth year], 53 y.o.   MRN: [id number] Scott Adkins [id number] [birth year] 02/15/2014      Progress Note-Follow Up  Subjective  Chief Complaint  Chief Complaint  Patient presents with  . Annual Exam    physical    HPI  Patient is a 53 year old male in today for routine medical care. Doing well for the most part. Is trying to mange his sugars w ith endocrinology but they are very variable. Low of 55 after skipping a meal and a high of 415 2 hours after eating. No recent illness. Continues to struggle with painful feet especially with working. No swelling or warmth, redness. Denies CP/palp/SOB/HA/congestion/fevers/GI or GU c/o. Taking meds as prescribed  Past Medical History  Diagnosis Date  . Hyperlipidemia   . Diabetes mellitus type I     uses insulin pump// managed by Dr. Casimiro Needle Adkins  . Hypertension   . Arthritis   . Injury of right rotator cuff 08/16/2012    Has been seen by [facility]  . Preventative health care 02/19/2013  . Chicken pox as a child  . Mumps as a child    Past Surgical History  Procedure Laterality Date  . Refractive surgery  2007    [facility]  . Rotator cuff repair  2004    left    Family History  Problem Relation Age of Onset  . Hypertension    . Stroke    . Colon cancer Neg Hx   . Prostate cancer Neg Hx   . Breast cancer Neg Hx   . Rectal cancer Neg Hx   . Hypertension Mother   . Stroke Father   . Hypertension Father   . Hyperlipidemia Father   . Hypertension Brother   . Hyperlipidemia Brother   . Mental illness Brother     depression  . Heart disease Maternal Grandmother     MI  . Heart disease Maternal Grandfather     MI  . Hyperlipidemia Brother   . Hypertension Brother     History   Social History  . Marital Status: Married    Spouse Name: N/A    Number of Children: N/A  . Years of Education: N/A   Occupational History  . Not on file.   Social History Main Topics  . Smoking  status: Never Smoker   . Smokeless tobacco: Never Used  . Alcohol Use: 0.6 oz/week    1 Shots of liquor per week     Comment: Socially  . Drug Use: No  . Sexual Activity: Not on file     Comment: lives with wife, daughter and mother in law, no dietary restrictions   Other Topics Concern  . Not on file   Social History Narrative   Married 14 yrs   2 daughters; 1 son   Occupation: Time [employer]          Current Outpatient Prescriptions on File Prior to Visit  Medication Sig Dispense Refill  . AFLURIA PRESERVATIVE FREE 0.5 ML SUSY Inject 6 Units as directed as directed.  0  . amoxicillin-clavulanate (AUGMENTIN) 875-125 MG per tablet Take 1 tablet by mouth 2 (two) times daily. 20 tablet 0  . aspirin 81 MG tablet Take 81 mg by mouth daily.    . Cholecalciferol (VITAMIN D3) 1000 UNITS CAPS Take 2,000 Units by mouth daily.    Marland Kitchen lisinopril (PRINIVIL,ZESTRIL) 20 MG tablet Take 1 tablet (20 mg total) by  mouth daily. 90 tablet 1  . NOVOLOG 100 UNIT/ML injection Novolog insulin per insulin pump    . simvastatin (ZOCOR) 40 MG tablet Take 1 tablet (40 mg total) by mouth daily. 90 tablet 1   No current facility-administered medications on file prior to visit.    No Known Allergies  Review of Systems  Review of Systems  Constitutional: Negative for fever and malaise/fatigue.  HENT: Negative for congestion.   Eyes: Negative for discharge.  Respiratory: Negative for shortness of breath.   Cardiovascular: Negative for chest pain, palpitations and leg swelling.  Gastrointestinal: Negative for nausea, abdominal pain and diarrhea.  Genitourinary: Negative for dysuria.  Musculoskeletal: Negative for falls.  Skin: Negative for rash.  Neurological: Negative for loss of consciousness and headaches.  Endo/Heme/Allergies: Negative for polydipsia.  Psychiatric/Behavioral: Negative for depression and suicidal ideas. The patient is not nervous/anxious and does not have insomnia.      Objective  BP 142/58 mmHg  Pulse 61  Temp(Src) 98.1 F (36.7 C) (Oral)  Ht 5' 7.5" (1.715 m)  Wt 171 lb (77.565 kg)  BMI 26.37 kg/m2  SpO2 100%  Physical Exam  Physical Exam  Constitutional: He is oriented to person, place, and time and well-developed, well-nourished, and in no distress. No distress.  HENT:  Head: Normocephalic and atraumatic.  Eyes: Conjunctivae are normal.  Neck: Neck supple. No thyromegaly present.  Cardiovascular: Normal rate, regular rhythm and normal heart sounds.   No murmur heard. Pulmonary/Chest: Effort normal and breath sounds normal. No respiratory distress.  Abdominal: He exhibits no distension and no mass. There is no tenderness.  Musculoskeletal: He exhibits no edema.  Neurological: He is alert and oriented to person, place, and time.  Skin: Skin is warm.  Psychiatric: Memory, affect and judgment normal.    Lab Results  Component Value Date   TSH 1.755 02/16/2013   Lab Results  Component Value Date   WBC 5.7 02/16/2013   HGB 14.1 02/16/2013   HCT 42.5 02/16/2013   MCV 87.1 02/16/2013   PLT 178 02/16/2013   Lab Results  Component Value Date   CREATININE 0.99 02/16/2013   BUN 15 02/16/2013   NA 138 02/16/2013   K 5.1 02/16/2013   CL 100 02/16/2013   CO2 31 02/16/2013   Lab Results  Component Value Date   ALT 14 02/16/2013   AST 15 02/16/2013   ALKPHOS 70 02/16/2013   BILITOT 0.8 02/16/2013   Lab Results  Component Value Date   CHOL 209* 02/16/2013   Lab Results  Component Value Date   HDL 61 02/16/2013   Lab Results  Component Value Date   LDLCALC 135* 02/16/2013   Lab Results  Component Value Date   TRIG 67 02/16/2013   Lab Results  Component Value Date   CHOLHDL 3.4 02/16/2013     Assessment & Plan  Essential hypertension Well controlled, no changes to meds. Encouraged heart healthy diet such as the DASH diet and exercise as tolerated.   Type 1 diabetes mellitus Poor control numbers ranging from  55 to 415 this past month. He is following with endocrinology, Dr Adkins. Encouraged small, frequent meals with lean proteins and minimal carbs. Requested records from Endocrinology and referred to Podiatry for foot pain and management  Hyperlipidemia Tolerating statin, encouraged heart healthy diet, avoid trans fats, minimize simple carbs and saturated fats. Increase exercise as tolerated  Preventative health care Patient encouraged to maintain heart healthy diet, regular exercise, adequate sleep. Consider daily probiotics. Take  medications as prescribed. Labs ordered today. Labs done today and reviewed.

## 2014-02-25 ENCOUNTER — Encounter: Payer: Self-pay | Admitting: Family Medicine

## 2014-02-25 NOTE — Assessment & Plan Note (Signed)
Tolerating statin, encouraged heart healthy diet, avoid trans fats, minimize simple carbs and saturated fats. Increase exercise as tolerated 

## 2014-02-25 NOTE — Assessment & Plan Note (Signed)
Patient encouraged to maintain heart healthy diet, regular exercise, adequate sleep. Consider daily probiotics. Take medications as prescribed. Labs ordered today. Labs done today and reviewed.

## 2014-02-25 NOTE — Assessment & Plan Note (Addendum)
Poor control numbers ranging from 55 to 415 this past month. He is following with endocrinology, Dr Altheimer. Encouraged small, frequent meals with lean proteins and minimal carbs. Requested records from Endocrinology and referred to Podiatry for foot pain and management

## 2014-02-25 NOTE — Assessment & Plan Note (Signed)
Well controlled, no changes to meds. Encouraged heart healthy diet such as the DASH diet and exercise as tolerated.  °

## 2014-07-20 ENCOUNTER — Other Ambulatory Visit: Payer: Self-pay | Admitting: Family Medicine

## 2014-08-16 ENCOUNTER — Ambulatory Visit (INDEPENDENT_AMBULATORY_CARE_PROVIDER_SITE_OTHER): Payer: BLUE CROSS/BLUE SHIELD | Admitting: Family Medicine

## 2014-08-16 ENCOUNTER — Encounter: Payer: Self-pay | Admitting: Family Medicine

## 2014-08-16 VITALS — BP 108/68 | HR 67 | Temp 98.3°F | Ht 67.5 in | Wt 178.5 lb

## 2014-08-16 DIAGNOSIS — E1065 Type 1 diabetes mellitus with hyperglycemia: Secondary | ICD-10-CM

## 2014-08-16 DIAGNOSIS — E785 Hyperlipidemia, unspecified: Secondary | ICD-10-CM | POA: Diagnosis not present

## 2014-08-16 DIAGNOSIS — IMO0002 Reserved for concepts with insufficient information to code with codable children: Secondary | ICD-10-CM

## 2014-08-16 DIAGNOSIS — I10 Essential (primary) hypertension: Secondary | ICD-10-CM

## 2014-08-16 LAB — COMPREHENSIVE METABOLIC PANEL
ALBUMIN: 3.8 g/dL (ref 3.5–5.2)
ALK PHOS: 69 U/L (ref 39–117)
ALT: 17 U/L (ref 0–53)
AST: 20 U/L (ref 0–37)
BUN: 21 mg/dL (ref 6–23)
CHLORIDE: 103 meq/L (ref 96–112)
CO2: 32 mEq/L (ref 19–32)
CREATININE: 0.89 mg/dL (ref 0.40–1.50)
Calcium: 9.3 mg/dL (ref 8.4–10.5)
GFR: 94.63 mL/min (ref >60.00)
Glucose, Bld: 54 mg/dL — ABNORMAL LOW (ref 70–99)
Potassium: 4.4 mEq/L (ref 3.5–5.1)
SODIUM: 139 meq/L (ref 135–145)
Total Bilirubin: 0.5 mg/dL (ref 0.2–1.2)
Total Protein: 6.7 g/dL (ref 6.0–8.3)

## 2014-08-16 LAB — LIPID PANEL
Cholesterol: 192 mg/dL (ref 0–200)
HDL: 64.4 mg/dL (ref >39.00)
LDL CALC: 112 mg/dL — AB (ref 0–99)
NonHDL: 127.6
Total CHOL/HDL Ratio: 3
Triglycerides: 79 mg/dL (ref 0.0–149.0)
VLDL: 15.8 mg/dL (ref 0.0–40.0)

## 2014-08-16 LAB — CBC
HCT: 39.5 % (ref 39.0–52.0)
Hemoglobin: 13.2 g/dL (ref 13.0–17.0)
MCHC: 33.3 g/dL (ref 30.0–36.0)
MCV: 86.4 fl (ref 78.0–100.0)
Platelets: 206 10*3/uL (ref 150.0–400.0)
RBC: 4.58 Mil/uL (ref 4.22–5.81)
RDW: 13 % (ref 11.5–15.5)
WBC: 8.1 10*3/uL (ref 4.0–10.5)

## 2014-08-16 LAB — HEMOGLOBIN A1C: Hgb A1c MFr Bld: 8.2 % — ABNORMAL HIGH (ref 4.6–6.5)

## 2014-08-16 LAB — TSH: TSH: 1.39 u[IU]/mL (ref 0.35–4.50)

## 2014-08-16 MED ORDER — SIMVASTATIN 40 MG PO TABS: 40.0000 mg | ORAL_TABLET | Freq: Every day | ORAL | Status: DC

## 2014-08-16 MED ORDER — INSULIN DETEMIR 100 UNIT/ML ~~LOC~~ SOLN: 10.0000 [IU] | Freq: Every day | SUBCUTANEOUS | Status: DC

## 2014-08-16 MED ORDER — SILDENAFIL CITRATE 100 MG PO TABS: 50.0000 mg | ORAL_TABLET | Freq: Every day | ORAL | Status: DC | PRN

## 2014-08-16 MED ORDER — LISINOPRIL 20 MG PO TABS: 20.0000 mg | ORAL_TABLET | Freq: Every day | ORAL | Status: DC

## 2014-08-16 NOTE — Patient Instructions (Signed)

## 2014-08-16 NOTE — Progress Notes (Signed)
Scott Adkins  [id number] [birth year] 08/16/2014      Progress Note-Follow Up  SuKyra Leylandbjective  Chief Complaint  Chief Complaint  Patient presents with  . Follow-up    HPI  Patient is a 54 y.o. male in today for routine medical care. Patient is in today for follow-up. He has had very labile blood sugars and has not seen endocrinology recently. He says he's had numbers in size 507 in his low as 43 but on average. His numbers are 150. He acknowledges fatigue, malaise, polyuria and polydipsia as a result. No recent illness or other acute complaints noted. Denies CP/palp/SOB/HA/congestion/fevers/GI or GU c/o. Taking meds as prescribed  Past Medical History  Diagnosis Date  . Hyperlipidemia   . Diabetes mellitus type I     uses insulin pump// managed by Dr. Casimiro NeedleMichael Altheimer  . Hypertension   . Arthritis   . Injury of right rotator cuff 08/16/2012    Has been seen by [facility]  . Preventative health care 02/19/2013  . Chicken pox as a child  . Mumps as a child    Past Surgical History  Procedure Laterality Date  . Refractive surgery  2007    [facility]  . Rotator cuff repair  2004    left    Family History  Problem Relation Age of Onset  . Hypertension    . Stroke    . Colon cancer Neg Hx   . Prostate cancer Neg Hx   . Breast cancer Neg Hx   . Rectal cancer Neg Hx   . Hypertension Mother   . Stroke Father   . Hypertension Father   . Hyperlipidemia Father   . Hypertension Brother   . Hyperlipidemia Brother   . Mental illness Brother     depression  . Heart disease Maternal Grandmother     MI  . Heart disease Maternal Grandfather     MI  . Hyperlipidemia Brother   . Hypertension Brother     History   Social History  . Marital Status: Married    Spouse Name: N/A  . Number of Children: N/A  . Years of Education: N/A   Occupational History  . Not on file.   Social History Main Topics  . Smoking status: Never Smoker   . Smokeless tobacco: Never Used  .  Alcohol Use: 0.6 oz/week    1 Shots of liquor per week     Comment: Socially  . Drug Use: No  . Sexual Activity: Not on file     Comment: lives with wife and mother in law, no dietary restrictions   Other Topics Concern  . Not on file   Social History Narrative   Married 14 yrs   2 daughters; 1 son   Occupation: [employer]          Current Outpatient Prescriptions on File Prior to Visit  Medication Sig Dispense Refill  . AFLURIA PRESERVATIVE FREE 0.5 ML SUSY Inject 6 Units as directed as directed.  0  . aspirin 81 MG tablet Take 81 mg by mouth daily.    . Cholecalciferol (VITAMIN D3) 1000 UNITS CAPS Take 2,000 Units by mouth daily.     No current facility-administered medications on file prior to visit.    No Known Allergies  Review of Systems  Review of Systems  Constitutional: Negative for fever and malaise/fatigue.  HENT: Negative for congestion.   Eyes: Negative for discharge.  Respiratory: Negative for shortness of breath.  Cardiovascular: Negative for chest pain, palpitations and leg swelling.  Gastrointestinal: Negative for nausea, abdominal pain and diarrhea.  Genitourinary: Negative for dysuria.  Musculoskeletal: Negative for falls.  Skin: Negative for rash.  Neurological: Negative for loss of consciousness and headaches.  Endo/Heme/Allergies: Negative for polydipsia.  Psychiatric/Behavioral: Negative for depression and suicidal ideas. The patient is not nervous/anxious and does not have insomnia.     Objective  BP 108/68 mmHg  Pulse 67  Temp(Src) 98.3 F (36.8 C) (Oral)  Ht 5' 7.5" (1.715 m)  Wt 178 lb 8 oz (80.967 kg)  BMI 27.53 kg/m2  SpO2 97%  Physical Exam  Physical Exam  Constitutional: He is oriented to person, place, and time and well-developed, well-nourished, and in no distress. No distress.  HENT:  Head: Normocephalic and atraumatic.  Eyes: Conjunctivae are normal.  Neck: Neck supple. No thyromegaly present.  Cardiovascular:  Normal rate, regular rhythm and normal heart sounds.   No murmur heard. Pulmonary/Chest: Effort normal and breath sounds normal. No respiratory distress.  Abdominal: He exhibits no distension and no mass. There is no tenderness.  Musculoskeletal: He exhibits no edema.  Neurological: He is alert and oriented to person, place, and time.  Skin: Skin is warm.  Psychiatric: Memory, affect and judgment normal.    Lab Results  Component Value Date   TSH 0.86 02/15/2014   Lab Results  Component Value Date   WBC 8.9 02/15/2014   HGB 13.3 02/15/2014   HCT 41.2 02/15/2014   MCV 87.9 02/15/2014   PLT 262.0 02/15/2014   Lab Results  Component Value Date   CREATININE 0.8 02/15/2014   BUN 16 02/15/2014   NA 138 02/15/2014   K 4.5 02/15/2014   CL 102 02/15/2014   CO2 27 02/15/2014   Lab Results  Component Value Date   ALT 14 02/15/2014   AST 20 02/15/2014   ALKPHOS 79 02/15/2014   BILITOT 0.5 02/15/2014   Lab Results  Component Value Date   CHOL 207* 02/15/2014   Lab Results  Component Value Date   HDL 52.80 02/15/2014   Lab Results  Component Value Date   LDLCALC 141* 02/15/2014   Lab Results  Component Value Date   TRIG 67.0 02/15/2014   Lab Results  Component Value Date   CHOLHDL 4 02/15/2014     Assessment & Plan  Essential hypertension Well controlled, no changes to meds. Encouraged heart healthy diet such as the DASH diet and exercise as tolerated.    Type 1 diabetes mellitus Very labile sugars, up as hi as 500 and as low as 100. Will have him increase his insulin by 2 units each dose and he is encouraged to proceed with endocrinology referral   Hyperlipidemia Tolerating statin, encouraged heart healthy diet, avoid trans fats, minimize simple carbs and saturated fats. Increase exercise as tolerated

## 2014-08-16 NOTE — Progress Notes (Signed)
Pre visit review using our clinic review tool, if applicable. No additional management support is needed unless otherwise documented below in the visit note. 

## 2014-08-26 NOTE — Assessment & Plan Note (Signed)
Tolerating statin, encouraged heart healthy diet, avoid trans fats, minimize simple carbs and saturated fats. Increase exercise as tolerated 

## 2014-08-26 NOTE — Assessment & Plan Note (Signed)
Well controlled, no changes to meds. Encouraged heart healthy diet such as the DASH diet and exercise as tolerated.  °

## 2014-08-26 NOTE — Assessment & Plan Note (Signed)
Very labile sugars, up as hi as 500 and as low as 100. Will have him increase his insulin by 2 units each dose and he is encouraged to proceed with endocrinology referral

## 2014-09-04 ENCOUNTER — Ambulatory Visit (INDEPENDENT_AMBULATORY_CARE_PROVIDER_SITE_OTHER): Payer: BLUE CROSS/BLUE SHIELD | Admitting: Endocrinology

## 2014-09-04 ENCOUNTER — Encounter: Payer: Self-pay | Admitting: Endocrinology

## 2014-09-04 VITALS — BP 134/80 | HR 65 | Temp 98.0°F | Ht 67.5 in | Wt 178.0 lb

## 2014-09-04 DIAGNOSIS — E1065 Type 1 diabetes mellitus with hyperglycemia: Secondary | ICD-10-CM | POA: Diagnosis not present

## 2014-09-04 DIAGNOSIS — E10329 Type 1 diabetes mellitus with mild nonproliferative diabetic retinopathy without macular edema: Secondary | ICD-10-CM

## 2014-09-04 DIAGNOSIS — E103299 Type 1 diabetes mellitus with mild nonproliferative diabetic retinopathy without macular edema, unspecified eye: Secondary | ICD-10-CM

## 2014-09-04 NOTE — Patient Instructions (Addendum)
good diet and exercise significantly improve the control of your diabetes.  please let me know if you wish to be referred to a dietician.  high blood sugar is very risky to your health.  you should see an eye doctor and dentist every year.  It is very important to get all recommended vaccinations.  controlling your blood pressure and cholesterol drastically reduces the damage diabetes does to your body.  Those who smoke should quit.  please discuss these with your doctor.  check your blood sugar twice a day.  vary the time of day when you check, between before the 3 meals, and at bedtime.  also check if you have symptoms of your blood sugar being too high or too low.  please keep a record of the readings and bring it to your next appointment here.  You can write it on any piece of paper.  please call us sooner if your blood sugar goes below 70, or if you have a lot of readings over 200.  For now, please continue the same levemir, and: Reduce the novolog to 3 times a day (just before each meal) 02-09-10. Please come back for a follow-up appointment in 1 month.   The next step may be to increase the supper novolog, and decrease the levemir.

## 2014-09-04 NOTE — Progress Notes (Signed)
Subjective:    Patient ID: Scott Adkins, male    DOB: July 23, 1960, 54 y.o.   MRN: [id number]  HPI pt states DM was dx'ed in 1982; he has mild if any neuropathy of the lower extremities, but he has associated retinopathy; he has been on insulin since dx; pt says his diet and exercise are good; he has never had pancreatitis.  He had DKA only once (at time of dx).  Last episode of severe hypoglycemia was 2004.  He used an insulin pump from 2007-2014, but stopped due to the cost, and recurrent hypoglycemia.  He takes levemir qhs, and qac novolog (5-15 units per dose, depending on size of meal).  He has mild hypoglycemia approx twice a week, usually after breakfast.  It is highest before lunch, after he treats the hypoglycemia with food).  The hypoglycemia happens only on workk days (he works M-F).  He eats a standard breakfast on the way to work, and take 12 units of novolog with it.   Past Medical History  Diagnosis Date  . Hyperlipidemia   . Diabetes mellitus type I     uses insulin pump// managed by Dr. Casimiro NeedleMichael Altheimer  . Hypertension   . Arthritis   . Injury of right rotator cuff 08/16/2012    Has been seen by [facility]  . Preventative health care 02/19/2013  . Chicken pox as a child  . Mumps as a child    Past Surgical History  Procedure Laterality Date  . [facility]  . Rotator cuff repair  2004    left  . Insulin pump      placed and removed    History   Social History  . Marital Status: Married    Spouse Name: N/A  . Number of Children: N/A  . Years of Education: N/A   Occupational History  . Not on file.   Social History Main Topics  . Smoking status: Never Smoker   . Smokeless tobacco: Never Used  . Alcohol Use: 0.6 oz/week    1 Shots of liquor per week     Comment: Socially  . Drug Use: No  . Sexual Activity: Not on file     Comment: lives with wife and mother in law, no dietary restrictions   Other Topics Concern  . Not on  file   Social History Narrative   Married 14 yrs   2 daughters; 1 son   Occupation: Time [employer]          Current Outpatient Prescriptions on File Prior to Visit  Medication Sig Dispense Refill  . AFLURIA PRESERVATIVE FREE 0.5 ML SUSY Inject 6 Units as directed as directed.  0  . aspirin 81 MG tablet Take 81 mg by mouth daily.    . Cholecalciferol (VITAMIN D3) 1000 UNITS CAPS Take 2,000 Units by mouth daily.    . insulin aspart (NOVOLOG) 100 UNIT/ML injection 3 times a day (just before each meal) 02-09-10 units 10 mL 11  . insulin detemir (LEVEMIR) 100 UNIT/ML injection Inject 0.1 mLs (10 Units total) into the skin at bedtime. 10 mL 5  . lisinopril (PRINIVIL,ZESTRIL) 20 MG tablet Take 1 tablet (20 mg total) by mouth daily. 90 tablet 2  . sildenafil (VIAGRA) 100 MG tablet Take 0.5-1 tablets (50-100 mg total) by mouth daily as needed for erectile dysfunction. 8 tablet 1  . simvastatin (ZOCOR) 40 MG tablet Take 1 tablet (40 mg total) by  mouth daily. 90 tablet 2   No current facility-administered medications on file prior to visit.    No Known Allergies  Family History  Problem Relation Age of Onset  . Hypertension    . Stroke    . Colon cancer Neg Hx   . Prostate cancer Neg Hx   . Breast cancer Neg Hx   . Rectal cancer Neg Hx   . Hypertension Mother   . Stroke Father   . Hypertension Father   . Hyperlipidemia Father   . Hypertension Brother   . Hyperlipidemia Brother   . Mental illness Brother     depression  . Heart disease Maternal Grandmother     MI  . Heart disease Maternal Grandfather     MI  . Hyperlipidemia Brother   . Hypertension Brother   . Diabetes Neg Hx     BP 134/80 mmHg  Pulse 65  Temp(Src) 98 F (36.7 C) (Oral)  Ht 5' 7.5" (1.715 m)  Wt 178 lb (80.74 kg)  BMI 27.45 kg/m2  SpO2 98%   Review of Systems denies weight loss, blurry vision, headache, chest pain, sob, n/v, urinary frequency, excessive diaphoresis, depression, cold intolerance,  rhinorrhea, and easy bruising.  He has intermittent leg cramps.    Objective:   Physical Exam VITAL SIGNS:  See vs page GENERAL: no distress Pulses: dorsalis pedis intact bilat.   MSK: no deformity of the feet CV: no leg edema Skin:  no ulcer on the feet.  normal color and temp on the feet. Neuro: sensation is intact to touch on the feet   Lab Results  Component Value Date   HGBA1C 8.2* 08/16/2014       Assessment & Plan:  DM: new to me: he needs increased rx.  He declines to resume the pump.    Patient is advised the following: Patient Instructions  good diet and exercise significantly improve the control of your diabetes.  please let me know if you wish to be referred to a dietician.  high blood sugar is very risky to your health.  you should see an eye doctor and dentist every year.  It is very important to get all recommended vaccinations.  controlling your blood pressure and cholesterol drastically reduces the damage diabetes does to your body.  Those who smoke should quit.  please discuss these with your doctor.  check your blood sugar twice a day.  vary the time of day when you check, between before the 3 meals, and at bedtime.  also check if you have symptoms of your blood sugar being too high or too low.  please keep a record of the readings and bring it to your next appointment here.  You can write it on any piece of paper.  please call us sooner if your blood sugar goes below 70, or if you have a lot of readings over 200.  For now, please continue the same levemir, and: Reduce the novolog to 3 times a day (just before each meal) 02-09-10. Please come back for a follow-up appointment in 1 month.   The next step may be to increase the supper novolog, and decrease the levemir.

## 2014-09-05 DIAGNOSIS — E1039 Type 1 diabetes mellitus with other diabetic ophthalmic complication: Secondary | ICD-10-CM | POA: Insufficient documentation

## 2014-09-05 HISTORY — DX: Type 1 diabetes mellitus with other diabetic ophthalmic complication: E10.39

## 2014-10-04 ENCOUNTER — Encounter: Payer: Self-pay | Admitting: Endocrinology

## 2014-10-04 ENCOUNTER — Ambulatory Visit (INDEPENDENT_AMBULATORY_CARE_PROVIDER_SITE_OTHER): Payer: BLUE CROSS/BLUE SHIELD | Admitting: Endocrinology

## 2014-10-04 VITALS — BP 124/60 | HR 62 | Temp 98.0°F | Ht 67.5 in | Wt 178.0 lb

## 2014-10-04 DIAGNOSIS — E10329 Type 1 diabetes mellitus with mild nonproliferative diabetic retinopathy without macular edema: Secondary | ICD-10-CM | POA: Diagnosis not present

## 2014-10-04 DIAGNOSIS — E103299 Type 1 diabetes mellitus with mild nonproliferative diabetic retinopathy without macular edema, unspecified eye: Secondary | ICD-10-CM

## 2014-10-04 NOTE — Progress Notes (Signed)
Subjective:    Patient ID: Scott Adkins, male    DOB: 12-06-1960, 54 y.o.   MRN: [id number]  HPI  Pt returns for f/u of diabetes mellitus: DM type: 1 Dx'ed: 1982 Complications: retinopathy Therapy: insulin since dx DKA: only once (at time of dx) Severe hypoglycemia: last episode was 2004 Pancreatitis: never Other: he used an insulin pump from 2007-2014, but stopped due to the cost, and recurrent hypoglycemia; he now takes multiple daily injections Interval history: no cbg record, but states cbg's are mildly low approx once per day, usually before lunch, with exertion.  This can happen on weekends as well as work days.  It is higher at hs (200), than in am.   Past Medical History  Diagnosis Date  . Hyperlipidemia   . Diabetes mellitus type I     uses insulin pump// managed by Dr. Casimiro Needle Altheimer  . Hypertension   . Arthritis   . Injury of right rotator cuff 08/16/2012    Has been seen by [facility]  . Preventative health care 02/19/2013  . Chicken pox as a child  . Mumps as a child    Past Surgical History  Procedure Laterality Date  . Refractive surgery  2007    [facility]  . Rotator cuff repair  2004    left  . Insulin pump      placed and removed    History   Social History  . Marital Status: Married    Spouse Name: N/A  . Number of Children: N/A  . Years of Education: N/A   Occupational History  . Not on file.   Social History Main Topics  . Smoking status: Never Smoker   . Smokeless tobacco: Never Used  . Alcohol Use: 0.6 oz/week    1 Shots of liquor per week     Comment: Socially  . Drug Use: No  . Sexual Activity: Not on file     Comment: lives with wife and mother in law, no dietary restrictions   Other Topics Concern  . Not on file   Social History Narrative   Married 14 yrs   2 daughters; 1 son   Occupation: Time Physiological scientist          Current Outpatient Prescriptions on File Prior to Visit  Medication Sig Dispense Refill  . AFLURIA  PRESERVATIVE FREE 0.5 ML SUSY Inject 6 Units as directed as directed.  0  . aspirin 81 MG tablet Take 81 mg by mouth daily.    . Cholecalciferol (VITAMIN D3) 1000 UNITS CAPS Take 2,000 Units by mouth daily.    . insulin aspart (NOVOLOG) 100 UNIT/ML injection 3 times a day (just before each meal) 09-10-10 units 10 mL 11  . insulin detemir (LEVEMIR) 100 UNIT/ML injection Inject 0.1 mLs (10 Units total) into the skin at bedtime. 10 mL 5  . lisinopril (PRINIVIL,ZESTRIL) 20 MG tablet Take 1 tablet (20 mg total) by mouth daily. 90 tablet 2  . sildenafil (VIAGRA) 100 MG tablet Take 0.5-1 tablets (50-100 mg total) by mouth daily as needed for erectile dysfunction. 8 tablet 1  . simvastatin (ZOCOR) 40 MG tablet Take 1 tablet (40 mg total) by mouth daily. 90 tablet 2   No current facility-administered medications on file prior to visit.    No Known Allergies  Family History  Problem Relation Age of Onset  . Hypertension    . Stroke    . Colon cancer Neg Hx   . Prostate  cancer Neg Hx   . Breast cancer Neg Hx   . Rectal cancer Neg Hx   . Hypertension Mother   . Stroke Father   . Hypertension Father   . Hyperlipidemia Father   . Hypertension Brother   . Hyperlipidemia Brother   . Mental illness Brother     depression  . Heart disease Maternal Grandmother     MI  . Heart disease Maternal Grandfather     MI  . Hyperlipidemia Brother   . Hypertension Brother   . Diabetes Neg Hx     BP 124/60 mmHg  Pulse 62  Temp(Src) 98 F (36.7 C) (Oral)  Ht 5' 7.5" (1.715 m)  Wt 178 lb (80.74 kg)  BMI 27.45 kg/m2  SpO2 97%    Review of Systems Denies LOC    Objective:   Physical Exam VITAL SIGNS:  See vs page GENERAL: no distress SKIN:  Insulin injection sites at the triceps areas are normal.     Lab Results  Component Value Date   HGBA1C 8.2* 08/16/2014       Assessment & Plan:  DM: apparently overcontrolled.   Patient is advised the following: Patient Instructions  check your  blood sugar twice a day.  vary the time of day when you check, between before the 3 meals, and at bedtime.  also check if you have symptoms of your blood sugar being too high or too low.  please keep a record of the readings and bring it to your next appointment here.  You can write it on any piece of paper.  please call us sooner if your blood sugar goes below 70, or if you have a lot of readings over 200.  For now, please continue the same levemir, and: Reduce the novolog to 3 times a day (just before each meal) 09-10-10 units. Please come back for a follow-up appointment in 2 months.

## 2014-10-04 NOTE — Patient Instructions (Addendum)
check your blood sugar twice a day.  vary the time of day when you check, between before the 3 meals, and at bedtime.  also check if you have symptoms of your blood sugar being too high or too low.  please keep a record of the readings and bring it to your next appointment here.  You can write it on any piece of paper.  please call us sooner if your blood sugar goes below 70, or if you have a lot of readings over 200.  For now, please continue the same levemir, and: Reduce the novolog to 3 times a day (just before each meal) 09-10-10 units. Please come back for a follow-up appointment in 2 months.

## 2014-11-21 ENCOUNTER — Ambulatory Visit (INDEPENDENT_AMBULATORY_CARE_PROVIDER_SITE_OTHER): Payer: BLUE CROSS/BLUE SHIELD | Admitting: Physician Assistant

## 2014-11-21 VITALS — BP 128/60 | HR 69 | Temp 97.7°F | Resp 16 | Ht 67.5 in | Wt 176.0 lb

## 2014-11-21 DIAGNOSIS — R252 Cramp and spasm: Secondary | ICD-10-CM

## 2014-11-21 LAB — BASIC METABOLIC PANEL
BUN: 25 mg/dL — AB (ref 6–23)
CHLORIDE: 103 meq/L (ref 96–112)
CO2: 27 mEq/L (ref 19–32)
Calcium: 9.7 mg/dL (ref 8.4–10.5)
Creatinine, Ser: 1.06 mg/dL (ref 0.40–1.50)
GFR: 77.27 mL/min (ref >60.00)
Glucose, Bld: 77 mg/dL (ref 70–99)
Potassium: 4.8 mEq/L (ref 3.5–5.1)
Sodium: 140 mEq/L (ref 135–145)

## 2014-11-21 LAB — VITAMIN D 25 HYDROXY (VIT D DEFICIENCY, FRACTURES): VITD: 34 ng/mL (ref 30.00–100.00)

## 2014-11-21 LAB — MAGNESIUM: MAGNESIUM: 2.2 mg/dL (ref 1.5–2.5)

## 2014-11-21 NOTE — Progress Notes (Signed)
Pre visit review using our clinic review tool, if applicable. No additional management support is needed unless otherwise documented below in the visit note. 

## 2014-11-21 NOTE — Patient Instructions (Signed)
Please go to the lab for blood work. I will call you with your results.  Please keep hydrated, having one gatorade in the morning and evening, in addition to your regular hydration regimen. A drop of mustart under the tongue may also help with cramping. Save Robaxin to use only if severe cramps recur. Again we should know more once your labs have results.  Consider starting a CoQ 10 supplement with your simvastatin.

## 2014-11-22 LAB — LACTIC ACID, PLASMA: LACTIC ACID: 2 mmol/L (ref 0.5–2.2)

## 2014-11-24 ENCOUNTER — Encounter: Payer: Self-pay | Admitting: Physician Assistant

## 2014-11-24 DIAGNOSIS — R252 Cramp and spasm: Secondary | ICD-10-CM | POA: Insufficient documentation

## 2014-11-24 NOTE — Assessment & Plan Note (Signed)
Improving. Suspect due to significant dehydration yesterday with electrolyte depletion. Will check BMP, Magnesium, Vit D and LA level. Increase fluids. Propel water BID over next couple of days.

## 2014-11-24 NOTE — Progress Notes (Signed)
Patient presents to clinic today c/o cramping of legs yesterday after working in the heat all day. Endorses trying to stay well hydrated while working. Endorses some residual cramping today but symptoms much improved.  Past Medical History  Diagnosis Date  . Hyperlipidemia   . Diabetes mellitus type I     uses insulin pump// managed by Dr. Casimiro Needle Altheimer  . Hypertension   . Arthritis   . Injury of right rotator cuff 08/16/2012    Has been seen by [facility]  . Preventative health care 02/19/2013  . Chicken pox as a child  . Mumps as a child    Current Outpatient Prescriptions on File Prior to Visit  Medication Sig Dispense Refill  . AFLURIA PRESERVATIVE FREE 0.5 ML SUSY Inject 6 Units as directed as directed.  0  . aspirin 81 MG tablet Take 81 mg by mouth daily.    . Cholecalciferol (VITAMIN D3) 1000 UNITS CAPS Take 2,000 Units by mouth daily.    . insulin aspart (NOVOLOG) 100 UNIT/ML injection 3 times a day (just before each meal) 09-10-10 units 10 mL 11  . insulin detemir (LEVEMIR) 100 UNIT/ML injection Inject 0.1 mLs (10 Units total) into the skin at bedtime. 10 mL 5  . lisinopril (PRINIVIL,ZESTRIL) 20 MG tablet Take 1 tablet (20 mg total) by mouth daily. 90 tablet 2  . sildenafil (VIAGRA) 100 MG tablet Take 0.5-1 tablets (50-100 mg total) by mouth daily as needed for erectile dysfunction. 8 tablet 1  . simvastatin (ZOCOR) 40 MG tablet Take 1 tablet (40 mg total) by mouth daily. 90 tablet 2   No current facility-administered medications on file prior to visit.    No Known Allergies  Family History  Problem Relation Age of Onset  . Hypertension    . Stroke    . Colon cancer Neg Hx   . Prostate cancer Neg Hx   . Breast cancer Neg Hx   . Rectal cancer Neg Hx   . Hypertension Mother   . Stroke Father   . Hypertension Father   . Hyperlipidemia Father   . Hypertension Brother   . Hyperlipidemia Brother   . Mental illness Brother     depression  . Heart disease  Maternal Grandmother     MI  . Heart disease Maternal Grandfather     MI  . Hyperlipidemia Brother   . Hypertension Brother   . Diabetes Neg Hx     Social History   Social History  . Marital Status: Married    Spouse Name: N/A  . Number of Children: N/A  . Years of Education: N/A   Social History Main Topics  . Smoking status: Never Smoker   . Smokeless tobacco: Never Used  . Alcohol Use: 0.6 oz/week    1 Shots of liquor per week     Comment: Socially  . Drug Use: No  . Sexual Activity: Not on file     Comment: lives with wife and mother in law, no dietary restrictions   Other Topics Concern  . Not on file   Social History Narrative   Married 14 yrs   2 daughters; 1 son   Occupation: Time [employer]          Review of Systems - See HPI.  All other ROS are negative.  BP 128/60 mmHg  Pulse 69  Temp(Src) 97.7 F (36.5 C) (Oral)  Resp 16  Ht 5' 7.5" (1.715 m)  Wt 176 lb ([phone]  kg)  BMI 27.14 kg/m2  SpO2 97%  Physical Exam  Constitutional: He is oriented to person, place, and time and well-developed, well-nourished, and in no distress.  HENT:  Head: Normocephalic and atraumatic.  Cardiovascular: Normal rate, regular rhythm, normal heart sounds and intact distal pulses.   Pulmonary/Chest: Effort normal and breath sounds normal. No respiratory distress. He has no wheezes. He has no rales. He exhibits no tenderness.  Neurological: He is alert and oriented to person, place, and time.  Skin: Skin is warm and dry. No rash noted.  Psychiatric: Affect normal.  Vitals reviewed.   Recent Results (from the past 2160 hour(s))  Basic Metabolic Panel (BMET)     Status: Abnormal   Collection Time: 11/21/14  2:31 PM  Result Value Ref Range   Sodium 140 135 - 145 mEq/L   Potassium 4.8 3.5 - 5.1 mEq/L   Chloride 103 96 - 112 mEq/L   CO2 27 19 - 32 mEq/L   Glucose, Bld 77 70 - 99 mg/dL   BUN 25 (H) 6 - 23 mg/dL   Creatinine, Ser 1.61 0.40 - 1.50 mg/dL   Calcium 9.7  8.4 - 09.6 mg/dL   GFR 04.54 >09.81 mL/min  Magnesium     Status: None   Collection Time: 11/21/14  2:31 PM  Result Value Ref Range   Magnesium 2.2 1.5 - 2.5 mg/dL  Vitamin D (25 hydroxy)     Status: None   Collection Time: 11/21/14  2:31 PM  Result Value Ref Range   VITD 34.00 30.00 - 100.00 ng/mL  Lactic acid, plasma     Status: None   Collection Time: 11/21/14  2:31 PM  Result Value Ref Range   LACTIC ACID 2.0 0.5 - 2.2 mmol/L    Assessment/Plan: Muscle cramping Improving. Suspect due to significant dehydration yesterday with electrolyte depletion. Will check BMP, Magnesium, Vit D and LA level. Increase fluids. Propel water BID over next couple of days.

## 2014-12-04 ENCOUNTER — Telehealth: Payer: Self-pay | Admitting: Family Medicine

## 2014-12-04 MED ORDER — INSULIN ASPART 100 UNIT/ML ~~LOC~~ SOLN: SUBCUTANEOUS | Status: DC

## 2014-12-04 NOTE — Telephone Encounter (Signed)
Refill done.  

## 2014-12-04 NOTE — Telephone Encounter (Signed)
Relation to ZO:XWRU Call back number:[phone] Pharmacy: [facility] - [place], [place] - [address] [phone] (Phone) [phone] (Fax)  Immunizations/Injections       Reason for call:  Patient requesting a refill insulin aspart (NOVOLOG) 100 UNIT/ML injection

## 2014-12-21 NOTE — Telephone Encounter (Signed)
Calle

## 2014-12-21 NOTE — Telephone Encounter (Signed)
Patient states he will run out of medication next week and pharmacy informed patient he can not pick up refill until the 30th. Please advise

## 2014-12-21 NOTE — Telephone Encounter (Signed)
Pt states he's currently using approximately 45 units of his Novolog daily.He states he will run out of rx by Thursday or Friday.  Please advise.

## 2014-12-21 NOTE — Telephone Encounter (Signed)
Called pharmacy states insurance will not pay for a refill until the 30th. Pt states

## 2014-12-21 NOTE — Telephone Encounter (Signed)
Please change the sig no his Novolog to read 10-15 units tid with meals. Then can increase amount prescribed and insurance should fill sooner. Disp 30 day supply with 5 rf

## 2014-12-26 ENCOUNTER — Telehealth: Payer: Self-pay | Admitting: Family Medicine

## 2014-12-26 MED ORDER — INSULIN ASPART 100 UNIT/ML ~~LOC~~ SOLN: 10.0000 [IU] | Freq: Three times a day (TID) | SUBCUTANEOUS | Status: DC

## 2014-12-26 NOTE — Telephone Encounter (Signed)
Pt notified of Dr. Abner Greenspan msg. New RX sent to pharmacy. No questions or concerns at this time.     Per Dr. Abner Greenspan Please change the sig no his Novolog to read 10-15 units tid with meals. Then can increase amount prescribed and insurance should fill sooner. Disp 30 day supply with 5 rf

## 2014-12-26 NOTE — Telephone Encounter (Signed)
Pt called in to check the status of his NOVOLOG Rx.  Informed pt of previous notes. Pt would like to speak to someone to make sure of things.   CB: [phone].

## 2015-02-15 ENCOUNTER — Encounter: Payer: Self-pay | Admitting: Family Medicine

## 2015-02-15 ENCOUNTER — Ambulatory Visit (INDEPENDENT_AMBULATORY_CARE_PROVIDER_SITE_OTHER): Payer: BLUE CROSS/BLUE SHIELD | Admitting: Family Medicine

## 2015-02-15 VITALS — BP 140/86 | HR 66 | Temp 97.6°F | Ht 67.5 in | Wt 178.5 lb

## 2015-02-15 DIAGNOSIS — E785 Hyperlipidemia, unspecified: Secondary | ICD-10-CM

## 2015-02-15 DIAGNOSIS — E103292 Type 1 diabetes mellitus with mild nonproliferative diabetic retinopathy without macular edema, left eye: Secondary | ICD-10-CM

## 2015-02-15 DIAGNOSIS — Z23 Encounter for immunization: Secondary | ICD-10-CM

## 2015-02-15 DIAGNOSIS — R252 Cramp and spasm: Secondary | ICD-10-CM | POA: Diagnosis not present

## 2015-02-15 DIAGNOSIS — I10 Essential (primary) hypertension: Secondary | ICD-10-CM

## 2015-02-15 DIAGNOSIS — M549 Dorsalgia, unspecified: Secondary | ICD-10-CM

## 2015-02-15 LAB — COMPREHENSIVE METABOLIC PANEL
ALT: 13 U/L (ref 0–53)
AST: 17 U/L (ref 0–37)
Albumin: 4.3 g/dL (ref 3.5–5.2)
Alkaline Phosphatase: 59 U/L (ref 39–117)
BUN: 16 mg/dL (ref 6–23)
CHLORIDE: 104 meq/L (ref 96–112)
CO2: 32 meq/L (ref 19–32)
Calcium: 9.6 mg/dL (ref 8.4–10.5)
Creatinine, Ser: 0.92 mg/dL (ref 0.40–1.50)
GFR: 90.91 mL/min (ref >60.00)
GLUCOSE: 155 mg/dL — AB (ref 70–99)
POTASSIUM: 5.3 meq/L — AB (ref 3.5–5.1)
Sodium: 140 mEq/L (ref 135–145)
Total Bilirubin: 0.7 mg/dL (ref 0.2–1.2)
Total Protein: 6.9 g/dL (ref 6.0–8.3)

## 2015-02-15 LAB — LIPID PANEL
CHOL/HDL RATIO: 3
Cholesterol: 178 mg/dL (ref 0–200)
HDL: 62.6 mg/dL (ref >39.00)
LDL Cholesterol: 104 mg/dL — ABNORMAL HIGH (ref 0–99)
NONHDL: 115.32
Triglycerides: 55 mg/dL (ref 0.0–149.0)
VLDL: 11 mg/dL (ref 0.0–40.0)

## 2015-02-15 LAB — MICROALBUMIN / CREATININE URINE RATIO
Creatinine,U: 104.1 mg/dL
MICROALB/CREAT RATIO: 0.7 mg/g (ref 0.0–30.0)
Microalb, Ur: <0.7 mg/dL (ref 0.0–1.9)

## 2015-02-15 LAB — CBC
HEMATOCRIT: 41.6 % (ref 39.0–52.0)
HEMOGLOBIN: 13.6 g/dL (ref 13.0–17.0)
MCHC: 32.7 g/dL (ref 30.0–36.0)
MCV: 88.5 fl (ref 78.0–100.0)
Platelets: 167 10*3/uL (ref 150.0–400.0)
RBC: 4.7 Mil/uL (ref 4.22–5.81)
RDW: 12.3 % (ref 11.5–15.5)
WBC: 5.7 10*3/uL (ref 4.0–10.5)

## 2015-02-15 LAB — HEMOGLOBIN A1C: Hgb A1c MFr Bld: 7.1 % — ABNORMAL HIGH (ref 4.6–6.5)

## 2015-02-15 LAB — TSH: TSH: 1.13 u[IU]/mL (ref 0.35–4.50)

## 2015-02-15 LAB — MAGNESIUM: MAGNESIUM: 2.2 mg/dL (ref 1.5–2.5)

## 2015-02-15 NOTE — Progress Notes (Signed)
Pre visit review using our clinic review tool, if applicable. No additional management support is needed unless otherwise documented below in the visit note. 

## 2015-02-15 NOTE — Patient Instructions (Signed)

## 2015-02-16 LAB — HIV ANTIBODY (ROUTINE TESTING W REFLEX): HIV 1&2 Ab, 4th Generation: NONREACTIVE

## 2015-02-16 LAB — HEPATITIS C ANTIBODY: HCV AB: NEGATIVE

## 2015-02-24 NOTE — Assessment & Plan Note (Signed)
Tolerating statin, encouraged heart healthy diet, avoid trans fats, minimize simple carbs and saturated fats. Increase exercise as tolerated 

## 2015-02-24 NOTE — Assessment & Plan Note (Signed)
Encouraged moist heat and gentle stretching as tolerated. May try NSAIDs and prescription meds as directed and report if symptoms worsen or seek immediate care 

## 2015-02-24 NOTE — Assessment & Plan Note (Signed)
Well controlled, no changes to meds. Encouraged heart healthy diet such as the DASH diet and exercise as tolerated.  °

## 2015-02-24 NOTE — Progress Notes (Signed)
Subjective:    Patient ID: Scott Adkins, male    DOB: 1960/08/10, 54 y.o.   MRN: [id number]  Chief Complaint  Patient presents with  . Follow-up    HPI Patient is in today for follow-up. He is feeling fairly well today. Denies polyuria or polydipsia. Has been trying to clean up his diet and exercise more. Started riding his bike regularly within the last couple of weeks. Notes his blood sugars have ranged between 78-245 but have been generally between 110 and 140. No recent acute illness. Denies CP/palp/SOB/HA/congestion/fevers/GI or GU c/o. Taking meds as prescribed  Past Medical History  Diagnosis Date  . Hyperlipidemia   . Diabetes mellitus type I (HCC)     uses insulin pump// managed by Dr. Casimiro Needle Altheimer  . Hypertension   . Arthritis   . Injury of right rotator cuff 08/16/2012    Has been seen by [facility]  . Preventative health care 02/19/2013  . Chicken pox as a child  . Mumps as a child  . DM (diabetes mellitus), type 1 with ophthalmic complications (HCC) 09/05/2014    Past Surgical History  Procedure Laterality Date  . Refractive surgery  2007    [facility]  . Rotator cuff repair  2004    left  . Insulin pump      placed and removed    Family History  Problem Relation Age of Onset  . Hypertension    . Stroke    . Colon cancer Neg Hx   . Prostate cancer Neg Hx   . Breast cancer Neg Hx   . Rectal cancer Neg Hx   . Hypertension Mother   . Stroke Father   . Hypertension Father   . Hyperlipidemia Father   . Hypertension Brother   . Hyperlipidemia Brother   . Mental illness Brother     depression  . Heart disease Maternal Grandmother     MI  . Heart disease Maternal Grandfather     MI  . Hyperlipidemia Brother   . Hypertension Brother   . Diabetes Neg Hx     Social History   Social History  . Marital Status: Married    Spouse Name: N/A  . Number of Children: N/A  . Years of Education: N/A   Occupational History  . Not on file.   Social  History Main Topics  . Smoking status: Never Smoker   . Smokeless tobacco: Never Used  . Alcohol Use: 0.6 oz/week    1 Shots of liquor per week     Comment: Socially  . Drug Use: No  . Sexual Activity: Not on file     Comment: lives with wife and mother in law, no dietary restrictions   Other Topics Concern  . Not on file   Social History Narrative   Married 14 yrs   2 daughters; 1 son   Occupation: Time Physiological scientist          Outpatient Prescriptions Prior to Visit  Medication Sig Dispense Refill  . aspirin 81 MG tablet Take 81 mg by mouth daily.    . Cholecalciferol (VITAMIN D3) 1000 UNITS CAPS Take 2,000 Units by mouth daily.    . insulin aspart (NOVOLOG) 100 UNIT/ML injection Inject 10-15 Units into the skin 3 (three) times daily with meals. 14 mL 5  . insulin detemir (LEVEMIR) 100 UNIT/ML injection Inject 0.1 mLs (10 Units total) into the skin at bedtime. 10 mL 5  . lisinopril (PRINIVIL,ZESTRIL) 20  MG tablet Take 1 tablet (20 mg total) by mouth daily. 90 tablet 2  . simvastatin (ZOCOR) 40 MG tablet Take 1 tablet (40 mg total) by mouth daily. 90 tablet 2  . sildenafil (VIAGRA) 100 MG tablet Take 0.5-1 tablets (50-100 mg total) by mouth daily as needed for erectile dysfunction. 8 tablet 1  . AFLURIA PRESERVATIVE FREE 0.5 ML SUSY Inject 6 Units as directed as directed.  0   No facility-administered medications prior to visit.    No Known Allergies  Review of Systems  Constitutional: Negative for fever, chills and malaise/fatigue.  HENT: Negative for congestion and hearing loss.   Eyes: Negative for discharge.  Respiratory: Negative for cough, sputum production and shortness of breath.   Cardiovascular: Negative for chest pain, palpitations and leg swelling.  Gastrointestinal: Negative for heartburn, nausea, vomiting, abdominal pain, diarrhea, constipation and blood in stool.  Genitourinary: Negative for dysuria, urgency, frequency and hematuria.  Musculoskeletal:  Positive for back pain. Negative for myalgias and falls.  Skin: Negative for rash.  Neurological: Negative for dizziness, sensory change, loss of consciousness, weakness and headaches.  Endo/Heme/Allergies: Negative for environmental allergies. Does not bruise/bleed easily.  Psychiatric/Behavioral: Negative for depression and suicidal ideas. The patient is not nervous/anxious and does not have insomnia.        Objective:    Physical Exam  Constitutional: He is oriented to person, place, and time. He appears well-developed and well-nourished. No distress.  HENT:  Head: Normocephalic and atraumatic.  Nose: Nose normal.  Eyes: Right eye exhibits no discharge. Left eye exhibits no discharge.  Neck: Normal range of motion. Neck supple.  Cardiovascular: Normal rate and regular rhythm.   No murmur heard. Pulmonary/Chest: Effort normal and breath sounds normal.  Abdominal: Soft. Bowel sounds are normal. There is no tenderness.  Musculoskeletal: He exhibits no edema.  Neurological: He is alert and oriented to person, place, and time.  Skin: Skin is warm and dry.  Psychiatric: He has a normal mood and affect.  Nursing note and vitals reviewed.   BP 140/86 mmHg  Pulse 66  Temp(Src) 97.6 F (36.4 C) (Oral)  Ht 5' 7.5" (1.715 m)  Wt 178 lb 8 oz (80.967 kg)  BMI 27.53 kg/m2  SpO2 96% Wt Readings from Last 3 Encounters:  02/15/15 178 lb 8 oz (80.967 kg)  11/21/14 176 lb (79.833 kg)  10/04/14 178 lb (80.74 kg)     Lab Results  Component Value Date   WBC 5.7 02/15/2015   HGB 13.6 02/15/2015   HCT 41.6 02/15/2015   PLT 167.0 02/15/2015   GLUCOSE 155* 02/15/2015   CHOL 178 02/15/2015   TRIG 55.0 02/15/2015   HDL 62.60 02/15/2015   LDLDIRECT 71 09/10/2008   LDLCALC 104* 02/15/2015   ALT 13 02/15/2015   AST 17 02/15/2015   NA 140 02/15/2015   K 5.3* 02/15/2015   CL 104 02/15/2015   CREATININE 0.92 02/15/2015   BUN 16 02/15/2015   CO2 32 02/15/2015   TSH 1.13 02/15/2015    PSA 0.61 02/15/2014   HGBA1C 7.1* 02/15/2015   MICROALBUR <0.7 02/15/2015    Lab Results  Component Value Date   TSH 1.13 02/15/2015   Lab Results  Component Value Date   WBC 5.7 02/15/2015   HGB 13.6 02/15/2015   HCT 41.6 02/15/2015   MCV 88.5 02/15/2015   PLT 167.0 02/15/2015   Lab Results  Component Value Date   NA 140 02/15/2015   K 5.3* 02/15/2015   CO2  32 02/15/2015   GLUCOSE 155* 02/15/2015   BUN 16 02/15/2015   CREATININE 0.92 02/15/2015   BILITOT 0.7 02/15/2015   ALKPHOS 59 02/15/2015   AST 17 02/15/2015   ALT 13 02/15/2015   PROT 6.9 02/15/2015   ALBUMIN 4.3 02/15/2015   CALCIUM 9.6 02/15/2015   GFR 90.91 02/15/2015   Lab Results  Component Value Date   CHOL 178 02/15/2015   Lab Results  Component Value Date   HDL 62.60 02/15/2015   Lab Results  Component Value Date   LDLCALC 104* 02/15/2015   Lab Results  Component Value Date   TRIG 55.0 02/15/2015   Lab Results  Component Value Date   CHOLHDL 3 02/15/2015   Lab Results  Component Value Date   HGBA1C 7.1* 02/15/2015       Assessment & Plan:   Problem List Items Addressed This Visit    Back pain without radiation    Encouraged moist heat and gentle stretching as tolerated. May try NSAIDs and prescription meds as directed and report if symptoms worsen or seek immediate care      DM (diabetes mellitus), type 1 with ophthalmic complications (HCC)    hgba1c acceptable nice improvement, minimize simple carbs. Increase exercise as tolerated. Continue current meds, given flu shot and pneumonia shot      Relevant Orders   TSH (Completed)   CBC (Completed)   Hemoglobin A1c (Completed)   Lipid panel (Completed)   Comprehensive metabolic panel (Completed)   Microalbumin / creatinine urine ratio (Completed)   HIV antibody (with reflex) (Completed)   Hepatitis C Antibody (Completed)   Magnesium (Completed)   Essential hypertension    Well controlled, no changes to meds. Encouraged heart  healthy diet such as the DASH diet and exercise as tolerated.       Relevant Orders   TSH (Completed)   CBC (Completed)   Hemoglobin A1c (Completed)   Lipid panel (Completed)   Comprehensive metabolic panel (Completed)   Microalbumin / creatinine urine ratio (Completed)   HIV antibody (with reflex) (Completed)   Hepatitis C Antibody (Completed)   Magnesium (Completed)   Hyperlipidemia    Tolerating statin, encouraged heart healthy diet, avoid trans fats, minimize simple carbs and saturated fats. Increase exercise as tolerated      Relevant Orders   TSH (Completed)   CBC (Completed)   Hemoglobin A1c (Completed)   Lipid panel (Completed)   Comprehensive metabolic panel (Completed)   Microalbumin / creatinine urine ratio (Completed)   HIV antibody (with reflex) (Completed)   Hepatitis C Antibody (Completed)   Magnesium (Completed)   Muscle cramping   Relevant Orders   TSH (Completed)   CBC (Completed)   Hemoglobin A1c (Completed)   Lipid panel (Completed)   Comprehensive metabolic panel (Completed)   Microalbumin / creatinine urine ratio (Completed)   HIV antibody (with reflex) (Completed)   Hepatitis C Antibody (Completed)   Magnesium (Completed)    Other Visit Diagnoses    Encounter for immunization    -  Primary    Need for vaccination with 13-polyvalent pneumococcal conjugate vaccine        Relevant Orders    Pneumococcal conjugate vaccine 13-valent (Completed)       I have discontinued Mr. Barnett ApplebaumJones's sildenafil. I am also having him maintain his Vitamin D3, aspirin, AFLURIA PRESERVATIVE FREE, lisinopril, simvastatin, insulin detemir, and insulin aspart.  No orders of the defined types were placed in this encounter.     Danise EdgeBLYTH, Dushaun Okey, MD

## 2015-02-24 NOTE — Assessment & Plan Note (Addendum)
hgba1c acceptable nice improvement, minimize simple carbs. Increase exercise as tolerated. Continue current meds, given flu shot and pneumonia shot

## 2015-06-18 ENCOUNTER — Ambulatory Visit: Payer: Self-pay | Admitting: Family Medicine

## 2015-06-21 ENCOUNTER — Ambulatory Visit (INDEPENDENT_AMBULATORY_CARE_PROVIDER_SITE_OTHER): Payer: BLUE CROSS/BLUE SHIELD | Admitting: Family Medicine

## 2015-06-21 ENCOUNTER — Encounter: Payer: Self-pay | Admitting: Family Medicine

## 2015-06-21 VITALS — BP 142/84 | HR 63 | Temp 97.7°F | Ht 67.5 in | Wt 189.0 lb

## 2015-06-21 DIAGNOSIS — N529 Male erectile dysfunction, unspecified: Secondary | ICD-10-CM

## 2015-06-21 DIAGNOSIS — E785 Hyperlipidemia, unspecified: Secondary | ICD-10-CM

## 2015-06-21 DIAGNOSIS — E103292 Type 1 diabetes mellitus with mild nonproliferative diabetic retinopathy without macular edema, left eye: Secondary | ICD-10-CM | POA: Diagnosis not present

## 2015-06-21 DIAGNOSIS — R6882 Decreased libido: Secondary | ICD-10-CM

## 2015-06-21 DIAGNOSIS — I10 Essential (primary) hypertension: Secondary | ICD-10-CM | POA: Diagnosis not present

## 2015-06-21 DIAGNOSIS — J4520 Mild intermittent asthma, uncomplicated: Secondary | ICD-10-CM

## 2015-06-21 LAB — CBC
HEMATOCRIT: 40.7 % (ref 39.0–52.0)
HEMOGLOBIN: 13.4 g/dL (ref 13.0–17.0)
MCHC: 32.8 g/dL (ref 30.0–36.0)
MCV: 87.4 fl (ref 78.0–100.0)
PLATELETS: 189 10*3/uL (ref 150.0–400.0)
RBC: 4.66 Mil/uL (ref 4.22–5.81)
RDW: 12.9 % (ref 11.5–15.5)
WBC: 7.3 10*3/uL (ref 4.0–10.5)

## 2015-06-21 LAB — LIPID PANEL
CHOL/HDL RATIO: 3
Cholesterol: 180 mg/dL (ref 0–200)
HDL: 64.4 mg/dL (ref >39.00)
LDL CALC: 104 mg/dL — AB (ref 0–99)
NonHDL: 116.09
Triglycerides: 62 mg/dL (ref 0.0–149.0)
VLDL: 12.4 mg/dL (ref 0.0–40.0)

## 2015-06-21 LAB — COMPREHENSIVE METABOLIC PANEL
ALT: 18 U/L (ref 0–53)
AST: 20 U/L (ref 0–37)
Albumin: 4.2 g/dL (ref 3.5–5.2)
Alkaline Phosphatase: 58 U/L (ref 39–117)
BUN: 21 mg/dL (ref 6–23)
CHLORIDE: 103 meq/L (ref 96–112)
CO2: 31 meq/L (ref 19–32)
CREATININE: 0.93 mg/dL (ref 0.40–1.50)
Calcium: 9.5 mg/dL (ref 8.4–10.5)
GFR: 89.67 mL/min (ref >60.00)
Glucose, Bld: 189 mg/dL — ABNORMAL HIGH (ref 70–99)
Potassium: 5.9 mEq/L — ABNORMAL HIGH (ref 3.5–5.1)
SODIUM: 138 meq/L (ref 135–145)
Total Bilirubin: 0.7 mg/dL (ref 0.2–1.2)
Total Protein: 6.9 g/dL (ref 6.0–8.3)

## 2015-06-21 LAB — TSH: TSH: 1.26 u[IU]/mL (ref 0.35–4.50)

## 2015-06-21 LAB — HEMOGLOBIN A1C: Hgb A1c MFr Bld: 7.1 % — ABNORMAL HIGH (ref 4.6–6.5)

## 2015-06-21 MED ORDER — LISINOPRIL 20 MG PO TABS: 20.0000 mg | ORAL_TABLET | Freq: Two times a day (BID) | ORAL | Status: DC

## 2015-06-21 MED ORDER — INSULIN ASPART 100 UNIT/ML ~~LOC~~ SOLN: 15.0000 [IU] | Freq: Three times a day (TID) | SUBCUTANEOUS | Status: DC

## 2015-06-21 MED ORDER — INSULIN DETEMIR 100 UNIT/ML ~~LOC~~ SOLN: 10.0000 [IU] | Freq: Every day | SUBCUTANEOUS | Status: DC

## 2015-06-21 NOTE — Assessment & Plan Note (Addendum)
Poorlycontrolled at home, increase Lisinopril to 20 mg po bid. Encouraged heart healthy diet such as the DASH diet and exercise as tolerated.

## 2015-06-21 NOTE — Progress Notes (Signed)
Patient ID: Scott Adkins, male   DOB: Sep 05, 1960, 55 y.o.   MRN: [id number]   Subjective:    Patient ID: Scott Adkins, male    DOB: 1960/05/04, 55 y.o.   MRN: [id number]  Chief Complaint  Patient presents with  . Follow-up    4 months on DM    HPI Patient is in today for follow up. He feels well today. No recent illness or acute concerns. Denies polyuria or polydipsia. Is noting elevated BP at home similar to what we are seeing here. Denies CP/palp/SOB/HA/congestion/fevers/GI or GU c/o. Taking meds as prescribed  Past Medical History  Diagnosis Date  . Hyperlipidemia   . Diabetes mellitus type I ([place])     uses insulin pump// managed by Dr. Legrand Como [place]  . Hypertension   . Arthritis   . Injury of right rotator cuff 08/16/2012    Has been seen by [facility]  . Preventative health care 02/19/2013  . Chicken pox as a child  . Mumps as a child  . DM (diabetes mellitus), type 1 with ophthalmic complications ([place]) 09/04/4429    Past Surgical History  Procedure Laterality Date  . Refractive surgery  2007    [place]  . Rotator cuff repair  2004    left  . Insulin pump      placed and removed    Family History  Problem Relation Age of Onset  . Hypertension    . Stroke    . Colon cancer Neg Hx   . Prostate cancer Neg Hx   . Breast cancer Neg Hx   . Rectal cancer Neg Hx   . Hypertension Mother   . Stroke Father   . Hypertension Father   . Hyperlipidemia Father   . Hypertension Brother   . Hyperlipidemia Brother   . Mental illness Brother     depression  . Heart disease Maternal Grandmother     MI  . Heart disease Maternal Grandfather     MI  . Hyperlipidemia Brother   . Hypertension Brother   . Diabetes Neg Hx     Social History   Social History  . Marital Status: Married    Spouse Name: N/A  . Number of Children: N/A  . Years of Education: N/A   Occupational History  . Not on file.   Social History Main Topics  . Smoking status: Never Smoker   .  Smokeless tobacco: Never Used  . Alcohol Use: 0.6 oz/week    1 Shots of liquor per week     Comment: Socially  . Drug Use: No  . Sexual Activity: Not on file     Comment: lives with wife and mother in law, no dietary restrictions   Other Topics Concern  . Not on file   Social History Narrative   Married 14 yrs   2 daughters; 1 son   Occupation: Time Comptroller          Outpatient Prescriptions Prior to Visit  Medication Sig Dispense Refill  . AFLURIA PRESERVATIVE FREE 0.5 ML SUSY Inject 6 Units as directed as directed.  0  . aspirin 81 MG tablet Take 81 mg by mouth daily.    . Cholecalciferol (VITAMIN D3) 1000 UNITS CAPS Take 2,000 Units by mouth daily.    . simvastatin (ZOCOR) 40 MG tablet Take 1 tablet (40 mg total) by mouth daily. 90 tablet 2  . insulin aspart (NOVOLOG) 100 UNIT/ML injection Inject 10-15 Units into the skin 3 (  three) times daily with meals. 14 mL 5  . insulin detemir (LEVEMIR) 100 UNIT/ML injection Inject 0.1 mLs (10 Units total) into the skin at bedtime. 10 mL 5  . lisinopril (PRINIVIL,ZESTRIL) 20 MG tablet Take 1 tablet (20 mg total) by mouth daily. 90 tablet 2   No facility-administered medications prior to visit.    No Known Allergies  Review of Systems  Constitutional: Negative for fever and malaise/fatigue.  HENT: Negative for congestion.   Eyes: Negative for blurred vision.  Respiratory: Negative for shortness of breath.   Cardiovascular: Negative for chest pain, palpitations and leg swelling.  Gastrointestinal: Negative for nausea, abdominal pain and blood in stool.  Genitourinary: Negative for dysuria and frequency.  Musculoskeletal: Negative for falls.  Skin: Negative for rash.  Neurological: Negative for dizziness, loss of consciousness and headaches.  Endo/Heme/Allergies: Negative for environmental allergies.  Psychiatric/Behavioral: Negative for depression. The patient is not nervous/anxious.        Objective:    Physical Exam    Constitutional: He is oriented to person, place, and time. He appears well-developed and well-nourished. No distress.  HENT:  Head: Normocephalic and atraumatic.  Nose: Nose normal.  Eyes: Right eye exhibits no discharge. Left eye exhibits no discharge.  Neck: Normal range of motion. Neck supple.  Cardiovascular: Normal rate and regular rhythm.   No murmur heard. Pulmonary/Chest: Effort normal and breath sounds normal.  Abdominal: Soft. Bowel sounds are normal. There is no tenderness.  Musculoskeletal: He exhibits no edema.  Neurological: He is alert and oriented to person, place, and time.  Skin: Skin is warm and dry.  Psychiatric: He has a normal mood and affect.  Nursing note and vitals reviewed.   BP 142/84 mmHg  Pulse 63  Temp(Src) 97.7 F (36.5 C) (Oral)  Ht 5' 7.5" (1.715 m)  Wt 189 lb (85.73 kg)  BMI 29.15 kg/m2  SpO2 98% Wt Readings from Last 3 Encounters:  06/21/15 189 lb (85.73 kg)  02/15/15 178 lb 8 oz (80.967 kg)  11/21/14 176 lb (79.833 kg)     Lab Results  Component Value Date   WBC 7.3 06/21/2015   HGB 13.4 06/21/2015   HCT 40.7 06/21/2015   PLT 189.0 06/21/2015   GLUCOSE 189* 06/21/2015   CHOL 180 06/21/2015   TRIG 62.0 06/21/2015   HDL 64.40 06/21/2015   LDLDIRECT 71 09/10/2008   LDLCALC 104* 06/21/2015   ALT 18 06/21/2015   AST 20 06/21/2015   NA 138 06/21/2015   K 5.9* 06/21/2015   CL 103 06/21/2015   CREATININE 0.93 06/21/2015   BUN 21 06/21/2015   CO2 31 06/21/2015   TSH 1.26 06/21/2015   PSA 0.61 02/15/2014   HGBA1C 7.1* 06/21/2015   MICROALBUR <0.7 02/15/2015    Lab Results  Component Value Date   TSH 1.26 06/21/2015   Lab Results  Component Value Date   WBC 7.3 06/21/2015   HGB 13.4 06/21/2015   HCT 40.7 06/21/2015   MCV 87.4 06/21/2015   PLT 189.0 06/21/2015   Lab Results  Component Value Date   NA 138 06/21/2015   K 5.9* 06/21/2015   CO2 31 06/21/2015   GLUCOSE 189* 06/21/2015   BUN 21 06/21/2015   CREATININE  0.93 06/21/2015   BILITOT 0.7 06/21/2015   ALKPHOS 58 06/21/2015   AST 20 06/21/2015   ALT 18 06/21/2015   PROT 6.9 06/21/2015   ALBUMIN 4.2 06/21/2015   CALCIUM 9.5 06/21/2015   GFR 89.67 06/21/2015   Lab  Results  Component Value Date   CHOL 180 06/21/2015   Lab Results  Component Value Date   HDL 64.40 06/21/2015   Lab Results  Component Value Date   LDLCALC 104* 06/21/2015   Lab Results  Component Value Date   TRIG 62.0 06/21/2015   Lab Results  Component Value Date   CHOLHDL 3 06/21/2015   Lab Results  Component Value Date   HGBA1C 7.1* 06/21/2015       Assessment & Plan:   Problem List Items Addressed This Visit    Asthma    No recent exacerbation      DM (diabetes mellitus), type 1 with ophthalmic complications ([facility])   Relevant Medications   insulin detemir (LEVEMIR) 100 UNIT/ML injection   insulin aspart (NOVOLOG) 100 UNIT/ML injection   lisinopril (PRINIVIL,ZESTRIL) 20 MG tablet   Other Relevant Orders   TSH (Completed)   CBC (Completed)   Lipid panel (Completed)   Comprehensive metabolic panel (Completed)   Hemoglobin A1c (Completed)   Essential hypertension - Primary    Poorlycontrolled at home, increase Lisinopril to 20 mg po bid. Encouraged heart healthy diet such as the DASH diet and exercise as tolerated.       Relevant Medications   lisinopril (PRINIVIL,ZESTRIL) 20 MG tablet   Other Relevant Orders   TSH (Completed)   CBC (Completed)   Lipid panel (Completed)   Comprehensive metabolic panel (Completed)   Hemoglobin A1c (Completed)   Comp Met (CMET)   Hyperlipidemia    Tolerating statin, encouraged heart healthy diet, avoid trans fats, minimize simple carbs and saturated fats. Increase exercise as tolerated      Relevant Medications   lisinopril (PRINIVIL,ZESTRIL) 20 MG tablet   Other Relevant Orders   TSH (Completed)   CBC (Completed)   Lipid panel (Completed)   Comprehensive metabolic panel (Completed)   Hemoglobin A1c  (Completed)   Low libido    With worsening ED, referred to urology for further consideration      Relevant Orders   TSH (Completed)   CBC (Completed)   Lipid panel (Completed)   Comprehensive metabolic panel (Completed)   Hemoglobin A1c (Completed)    Other Visit Diagnoses    Erectile dysfunction, unspecified erectile dysfunction type        Relevant Orders    Ambulatory referral to Urology    TSH (Completed)    CBC (Completed)    Lipid panel (Completed)    Comprehensive metabolic panel (Completed)    Hemoglobin A1c (Completed)       I have changed Mr. Dalzell insulin aspart and lisinopril. I am also having him maintain his Vitamin D3, aspirin, AFLURIA PRESERVATIVE FREE, simvastatin, and insulin detemir.  Meds ordered this encounter  Medications  . insulin detemir (LEVEMIR) 100 UNIT/ML injection    Sig: Inject 0.1 mLs (10 Units total) into the skin at bedtime.    Dispense:  10 mL    Refill:  5  . insulin aspart (NOVOLOG) 100 UNIT/ML injection    Sig: Inject 15-20 Units into the skin 3 (three) times daily with meals.    Dispense:  120 mL    Refill:  5  . lisinopril (PRINIVIL,ZESTRIL) 20 MG tablet    Sig: Take 1 tablet (20 mg total) by mouth 2 (two) times daily.    Dispense:  180 tablet    Refill:  1     Penni Homans, MD

## 2015-06-21 NOTE — Progress Notes (Signed)
Pre visit review using our clinic review tool, if applicable. No additional management support is needed unless otherwise documented below in the visit note. 

## 2015-06-21 NOTE — Assessment & Plan Note (Signed)
No recent exacerbation 

## 2015-06-21 NOTE — Assessment & Plan Note (Signed)
With worsening ED, referred to urology for further consideration

## 2015-06-21 NOTE — Assessment & Plan Note (Signed)
Tolerating statin, encouraged heart healthy diet, avoid trans fats, minimize simple carbs and saturated fats. Increase exercise as tolerated 

## 2015-06-21 NOTE — Patient Instructions (Signed)
Hypertension Hypertension, commonly called high blood pressure, is when the force of blood pumping through your arteries is too strong. Your arteries are the blood vessels that carry blood from your heart throughout your body. A blood pressure reading consists of a higher number over a lower number, such as 110/72. The higher number (systolic) is the pressure inside your arteries when your heart pumps. The lower number (diastolic) is the pressure inside your arteries when your heart relaxes. Ideally you want your blood pressure below 120/80. Hypertension forces your heart to work harder to pump blood. Your arteries may become narrow or stiff. Having untreated or uncontrolled hypertension can cause heart attack, stroke, kidney disease, and other problems. RISK FACTORS Some risk factors for high blood pressure are controllable. Others are not.  Risk factors you cannot control include:   Race. You may be at higher risk if you are African American.  Age. Risk increases with age.  Gender. Men are at higher risk than women before age 45 years. After age 65, women are at higher risk than men. Risk factors you can control include:  Not getting enough exercise or physical activity.  Being overweight.  Getting too much fat, sugar, calories, or salt in your diet.  Drinking too much alcohol. SIGNS AND SYMPTOMS Hypertension does not usually cause signs or symptoms. Extremely high blood pressure (hypertensive crisis) may cause headache, anxiety, shortness of breath, and nosebleed. DIAGNOSIS To check if you have hypertension, your health care provider will measure your blood pressure while you are seated, with your arm held at the level of your heart. It should be measured at least twice using the same arm. Certain conditions can cause a difference in blood pressure between your right and left arms. A blood pressure reading that is higher than normal on one occasion does not mean that you need treatment. If  it is not clear whether you have high blood pressure, you may be asked to return on a different day to have your blood pressure checked again. Or, you may be asked to monitor your blood pressure at home for 1 or more weeks. TREATMENT Treating high blood pressure includes making lifestyle changes and possibly taking medicine. Living a healthy lifestyle can help lower high blood pressure. You may need to change some of your habits. Lifestyle changes may include:  Following the DASH diet. This diet is high in fruits, vegetables, and whole grains. It is low in salt, red meat, and added sugars.  Keep your sodium intake below 2,300 mg per day.  Getting at least 30-45 minutes of aerobic exercise at least 4 times per week.  Losing weight if necessary.  Not smoking.  Limiting alcoholic beverages.  Learning ways to reduce stress. Your health care provider may prescribe medicine if lifestyle changes are not enough to get your blood pressure under control, and if one of the following is true:  You are 18-59 years of age and your systolic blood pressure is above 140.  You are 60 years of age or older, and your systolic blood pressure is above 150.  Your diastolic blood pressure is above 90.  You have diabetes, and your systolic blood pressure is over 140 or your diastolic blood pressure is over 90.  You have kidney disease and your blood pressure is above 140/90.  You have heart disease and your blood pressure is above 140/90. Your personal target blood pressure may vary depending on your medical conditions, your age, and other factors. HOME CARE INSTRUCTIONS    Have your blood pressure rechecked as directed by your health care provider.   Take medicines only as directed by your health care provider. Follow the directions carefully. Blood pressure medicines must be taken as prescribed. The medicine does not work as well when you skip doses. Skipping doses also puts you at risk for  problems.  Do not smoke.   Monitor your blood pressure at home as directed by your health care provider. SEEK MEDICAL CARE IF:   You think you are having a reaction to medicines taken.  You have recurrent headaches or feel dizzy.  You have swelling in your ankles.  You have trouble with your vision. SEEK IMMEDIATE MEDICAL CARE IF:  You develop a severe headache or confusion.  You have unusual weakness, numbness, or feel faint.  You have severe chest or abdominal pain.  You vomit repeatedly.  You have trouble breathing. MAKE SURE YOU:   Understand these instructions.  Will watch your condition.  Will get help right away if you are not doing well or get worse.   This information is not intended to replace advice given to you by your health care provider. Make sure you discuss any questions you have with your health care provider.   Document Released: 03/23/2005 Document Revised: 08/07/2014 Document Reviewed: 01/13/2013 Elsevier Interactive Patient Education 2016 Elsevier Inc.  

## 2015-07-18 ENCOUNTER — Other Ambulatory Visit (INDEPENDENT_AMBULATORY_CARE_PROVIDER_SITE_OTHER): Payer: BLUE CROSS/BLUE SHIELD

## 2015-07-18 DIAGNOSIS — I10 Essential (primary) hypertension: Secondary | ICD-10-CM | POA: Diagnosis not present

## 2015-07-18 LAB — COMPREHENSIVE METABOLIC PANEL
ALK PHOS: 57 U/L (ref 39–117)
ALT: 19 U/L (ref 0–53)
AST: 21 U/L (ref 0–37)
Albumin: 4 g/dL (ref 3.5–5.2)
BILIRUBIN TOTAL: 0.5 mg/dL (ref 0.2–1.2)
BUN: 24 mg/dL — ABNORMAL HIGH (ref 6–23)
CO2: 32 meq/L (ref 19–32)
CREATININE: 1.12 mg/dL (ref 0.40–1.50)
Calcium: 9.5 mg/dL (ref 8.4–10.5)
Chloride: 104 mEq/L (ref 96–112)
GFR: 72.33 mL/min (ref >60.00)
GLUCOSE: 58 mg/dL — AB (ref 70–99)
Potassium: 5.3 mEq/L — ABNORMAL HIGH (ref 3.5–5.1)
Sodium: 139 mEq/L (ref 135–145)
TOTAL PROTEIN: 6.7 g/dL (ref 6.0–8.3)

## 2015-07-19 ENCOUNTER — Other Ambulatory Visit: Payer: Self-pay

## 2015-07-25 ENCOUNTER — Ambulatory Visit (INDEPENDENT_AMBULATORY_CARE_PROVIDER_SITE_OTHER): Payer: BLUE CROSS/BLUE SHIELD | Admitting: Family Medicine

## 2015-07-25 VITALS — BP 124/68 | HR 62

## 2015-07-25 DIAGNOSIS — I10 Essential (primary) hypertension: Secondary | ICD-10-CM

## 2015-07-25 DIAGNOSIS — E875 Hyperkalemia: Secondary | ICD-10-CM

## 2015-07-25 LAB — COMPLETE METABOLIC PANEL WITH GFR
ALBUMIN: 3.9 g/dL (ref 3.6–5.1)
ALT: 17 U/L (ref 9–46)
AST: 21 U/L (ref 10–35)
Alkaline Phosphatase: 58 U/L (ref 40–115)
BILIRUBIN TOTAL: 0.3 mg/dL (ref 0.2–1.2)
BUN: 23 mg/dL (ref 7–25)
CALCIUM: 9.2 mg/dL (ref 8.6–10.3)
CHLORIDE: 101 mmol/L (ref 98–110)
CO2: 29 mmol/L (ref 20–31)
CREATININE: 1.07 mg/dL (ref 0.70–1.33)
GFR, Est Non African American: 78 mL/min (ref >=60)
Glucose, Bld: 145 mg/dL — ABNORMAL HIGH (ref 65–99)
Potassium: 4.9 mmol/L (ref 3.5–5.3)
Sodium: 137 mmol/L (ref 135–146)
TOTAL PROTEIN: 6.5 g/dL (ref 6.1–8.1)

## 2015-07-25 NOTE — Progress Notes (Signed)
RN blood check note reviewed. Agree with documention and plan. 

## 2015-07-25 NOTE — Patient Instructions (Signed)
Per Dr. Abner GreenspanBlyth: Continue with current medication regimen. Go to lab for CMP. Keep appointment for annual physical on 12/06/15 at 10:30 AM.

## 2015-07-25 NOTE — Progress Notes (Signed)
Pre visit review using our clinic review tool, if applicable. No additional management support is needed unless otherwise documented below in the visit note.  Patient presents in office today for a blood pressure check per OV note 06/21/15. Readings were as follow: BP 129/73 P 63 & BP 124/68 P 62.  Per Dr. Abner GreenspanBlyth: Continue with current medication regimen. Go to lab for CMP. Keep appointment for annual physical on 12/06/15 at 10:30 AM. Informed patient of the  provider's recommendations. He voiced understanding and did not have any questions or concerns prior to leaving nurse visit.  RN blood check note reviewed. Agree with documention and plan.

## 2015-07-29 ENCOUNTER — Other Ambulatory Visit: Payer: Self-pay | Admitting: Family Medicine

## 2015-08-18 ENCOUNTER — Other Ambulatory Visit: Payer: Self-pay | Admitting: Family Medicine

## 2015-08-21 ENCOUNTER — Other Ambulatory Visit: Payer: Self-pay | Admitting: Family Medicine

## 2015-09-12 ENCOUNTER — Encounter: Payer: Self-pay | Admitting: Family Medicine

## 2015-09-12 ENCOUNTER — Ambulatory Visit (INDEPENDENT_AMBULATORY_CARE_PROVIDER_SITE_OTHER): Payer: BLUE CROSS/BLUE SHIELD | Admitting: Family Medicine

## 2015-09-12 VITALS — BP 122/80 | HR 68 | Temp 98.0°F | Ht 67.5 in | Wt 188.0 lb

## 2015-09-12 DIAGNOSIS — R109 Unspecified abdominal pain: Secondary | ICD-10-CM

## 2015-09-12 DIAGNOSIS — I10 Essential (primary) hypertension: Secondary | ICD-10-CM | POA: Diagnosis not present

## 2015-09-12 DIAGNOSIS — M549 Dorsalgia, unspecified: Secondary | ICD-10-CM

## 2015-09-12 DIAGNOSIS — J4520 Mild intermittent asthma, uncomplicated: Secondary | ICD-10-CM | POA: Diagnosis not present

## 2015-09-12 DIAGNOSIS — E785 Hyperlipidemia, unspecified: Secondary | ICD-10-CM

## 2015-09-12 DIAGNOSIS — E103292 Type 1 diabetes mellitus with mild nonproliferative diabetic retinopathy without macular edema, left eye: Secondary | ICD-10-CM

## 2015-09-12 HISTORY — DX: Unspecified abdominal pain: R10.9

## 2015-09-12 MED ORDER — INSULIN DETEMIR 100 UNIT/ML ~~LOC~~ SOLN: 10.0000 [IU] | Freq: Every day | SUBCUTANEOUS | Status: DC

## 2015-09-12 NOTE — Progress Notes (Signed)
Pre visit review using our clinic review tool, if applicable. No additional management support is needed unless otherwise documented below in the visit note. 

## 2015-09-12 NOTE — Patient Instructions (Signed)
Moist heat and gentle stretching, Salonpas or Aspercreme patches, Lidocaine.  Robaxin/Methocarbimol muscle relaxer at bed as needed  Back Pain, Adult Back pain is very common in adults.The cause of back pain is rarely dangerous and the pain often gets better over time.The cause of your back pain may not be known. Some common causes of back pain include:  Strain of the muscles or ligaments supporting the spine.  Wear and tear (degeneration) of the spinal disks.  Arthritis.  Direct injury to the back. For many people, back pain may return. Since back pain is rarely dangerous, most people can learn to manage this condition on their own. HOME CARE INSTRUCTIONS Watch your back pain for any changes. The following actions may help to lessen any discomfort you are feeling:  Remain active. It is stressful on your back to sit or stand in one place for long periods of time. Do not sit, drive, or stand in one place for more than 30 minutes at a time. Take short walks on even surfaces as soon as you are able.Try to increase the length of time you walk each day.  Exercise regularly as directed by your health care provider. Exercise helps your back heal faster. It also helps avoid future injury by keeping your muscles strong and flexible.  Do not stay in bed.Resting more than 1-2 days can delay your recovery.  Pay attention to your body when you bend and lift. The most comfortable positions are those that put less stress on your recovering back. Always use proper lifting techniques, including:  Bending your knees.  Keeping the load close to your body.  Avoiding twisting.  Find a comfortable position to sleep. Use a firm mattress and lie on your side with your knees slightly bent. If you lie on your back, put a pillow under your knees.  Avoid feeling anxious or stressed.Stress increases muscle tension and can worsen back pain.It is important to recognize when you are anxious or stressed and  learn ways to manage it, such as with exercise.  Take medicines only as directed by your health care provider. Over-the-counter medicines to reduce pain and inflammation are often the most helpful.Your health care provider may prescribe muscle relaxant drugs.These medicines help dull your pain so you can more quickly return to your normal activities and healthy exercise.  Apply ice to the injured area:  Put ice in a plastic bag.  Place a towel between your skin and the bag.  Leave the ice on for 20 minutes, 2-3 times a day for the first 2-3 days. After that, ice and heat may be alternated to reduce pain and spasms.  Maintain a healthy weight. Excess weight puts extra stress on your back and makes it difficult to maintain good posture. SEEK MEDICAL CARE IF:  You have pain that is not relieved with rest or medicine.  You have increasing pain going down into the legs or buttocks.  You have pain that does not improve in one week.  You have night pain.  You lose weight.  You have a fever or chills. SEEK IMMEDIATE MEDICAL CARE IF:   You develop new bowel or bladder control problems.  You have unusual weakness or numbness in your arms or legs.  You develop nausea or vomiting.  You develop abdominal pain.  You feel faint.   This information is not intended to replace advice given to you by your health care provider. Make sure you discuss any questions you have with your health  care provider.   Document Released: 03/23/2005 Document Revised: 04/13/2014 Document Reviewed: 07/25/2013 Elsevier Interactive Patient Education [employer]2016 Elsevier Inc.

## 2015-09-13 ENCOUNTER — Other Ambulatory Visit: Payer: Self-pay | Admitting: Family Medicine

## 2015-09-13 ENCOUNTER — Telehealth: Payer: Self-pay | Admitting: *Deleted

## 2015-09-13 LAB — CBC WITH DIFFERENTIAL/PLATELET
Basophils Absolute: 0.1 10*3/uL (ref 0.0–0.1)
Basophils Relative: 1.3 % (ref 0.0–3.0)
EOS ABS: 0.1 10*3/uL (ref 0.0–0.7)
EOS PCT: 1.5 % (ref 0.0–5.0)
HCT: 40.7 % (ref 39.0–52.0)
Hemoglobin: 13.5 g/dL (ref 13.0–17.0)
LYMPHS ABS: 2.8 10*3/uL (ref 0.7–4.0)
Lymphocytes Relative: 28.5 % (ref 12.0–46.0)
MCHC: 33.1 g/dL (ref 30.0–36.0)
MCV: 87 fl (ref 78.0–100.0)
MONO ABS: 1 10*3/uL (ref 0.1–1.0)
Monocytes Relative: 10.3 % (ref 3.0–12.0)
Neutro Abs: 5.7 10*3/uL (ref 1.4–7.7)
Neutrophils Relative %: 58.4 % (ref 43.0–77.0)
Platelets: 208 10*3/uL (ref 150.0–400.0)
RBC: 4.68 Mil/uL (ref 4.22–5.81)
RDW: 12.5 % (ref 11.5–15.5)
WBC: 9.7 10*3/uL (ref 4.0–10.5)

## 2015-09-13 LAB — COMPREHENSIVE METABOLIC PANEL
ALT: 13 U/L (ref 0–53)
AST: 18 U/L (ref 0–37)
Albumin: 4.3 g/dL (ref 3.5–5.2)
Alkaline Phosphatase: 62 U/L (ref 39–117)
BUN: 22 mg/dL (ref 6–23)
CHLORIDE: 103 meq/L (ref 96–112)
CO2: 32 mEq/L (ref 19–32)
Calcium: 9.7 mg/dL (ref 8.4–10.5)
Creatinine, Ser: 0.97 mg/dL (ref 0.40–1.50)
GFR: 85.34 mL/min (ref >60.00)
GLUCOSE: 29 mg/dL — AB (ref 70–99)
POTASSIUM: 4.7 meq/L (ref 3.5–5.1)
SODIUM: 140 meq/L (ref 135–145)
TOTAL PROTEIN: 7.2 g/dL (ref 6.0–8.3)
Total Bilirubin: 0.4 mg/dL (ref 0.2–1.2)

## 2015-09-13 NOTE — Telephone Encounter (Signed)
PCP informed of results/see result notes.

## 2015-09-13 NOTE — Telephone Encounter (Signed)
[facility] reporting critical glucose @ 29

## 2015-09-22 NOTE — Assessment & Plan Note (Signed)
Well controlled, no changes to meds. Encouraged heart healthy diet such as the DASH diet and exercise as tolerated.  °

## 2015-09-22 NOTE — Progress Notes (Signed)
Patient ID: Scott Adkins, male   DOB: 15-Oct-1960, 55 y.o.   MRN: [id number]   Subjective:    Patient ID: Scott Adkins, male    DOB: May 04, 1960, 55 y.o.   MRN: [id number]  Chief Complaint  Patient presents with  . Flank Pain    left side for 3 months    HPI Patient is in today for follow up. Is noting intermittent sometimes very severe left flank pain worse with certain movements and causing disruptions with sleep. No other recent or new concerns. Denies CP/palp/SOB/HA/congestion/fevers/GI or GU c/o. Taking meds as prescribed  Past Medical History  Diagnosis Date  . Hyperlipidemia   . Diabetes mellitus type I ([place])     uses insulin pump// managed by Dr. Legrand Como [place]  . Hypertension   . Arthritis   . Injury of right rotator cuff 08/16/2012    Has been seen by [facility]  . Preventative health care 02/19/2013  . Chicken pox as a child  . Mumps as a child  . DM (diabetes mellitus), type 1 with ophthalmic complications ([place]) 04/08/7515  . Left flank pain 09/12/2015    Past Surgical History  Procedure Laterality Date  . Refractive surgery  2007    [place]  . Rotator cuff repair  2004    left  . Insulin pump      placed and removed    Family History  Problem Relation Age of Onset  . Hypertension    . Stroke    . Colon cancer Neg Hx   . Prostate cancer Neg Hx   . Breast cancer Neg Hx   . Rectal cancer Neg Hx   . Hypertension Mother   . Stroke Father   . Hypertension Father   . Hyperlipidemia Father   . Hypertension Brother   . Hyperlipidemia Brother   . Mental illness Brother     depression  . Heart disease Maternal Grandmother     MI  . Heart disease Maternal Grandfather     MI  . Hyperlipidemia Brother   . Hypertension Brother   . Diabetes Neg Hx     Social History   Social History  . Marital Status: Married    Spouse Name: N/A  . Number of Children: N/A  . Years of Education: N/A   Occupational History  . Not on file.   Social History Main  Topics  . Smoking status: Never Smoker   . Smokeless tobacco: Never Used  . Alcohol Use: 0.6 oz/week    1 Shots of liquor per week     Comment: Socially  . Drug Use: No  . Sexual Activity: Not on file     Comment: lives with wife and mother in law, no dietary restrictions   Other Topics Concern  . Not on file   Social History Narrative   Married 14 yrs   2 daughters; 1 son   Occupation: Time Comptroller          Outpatient Prescriptions Prior to Visit  Medication Sig Dispense Refill  . aspirin 81 MG tablet Take 81 mg by mouth daily.    . Cholecalciferol (VITAMIN D3) 1000 UNITS CAPS Take 2,000 Units by mouth daily.    . insulin aspart (NOVOLOG) 100 UNIT/ML injection Inject 15-20 Units into the skin 3 (three) times daily with meals. 120 mL 5  . lisinopril (PRINIVIL,ZESTRIL) 20 MG tablet Take 1 tablet (20 mg total) by mouth 2 (two) times daily. 180 tablet 1  .  simvastatin (ZOCOR) 40 MG tablet TAKE ONE TABLET BY MOUTH ONCE DAILY 90 tablet 2  . insulin detemir (LEVEMIR) 100 UNIT/ML injection Inject 0.1 mLs (10 Units total) into the skin at bedtime. 10 mL 5  . tadalafil (CIALIS) 5 MG tablet Take 5 mg by mouth. Reported on 07/25/2015    . AFLURIA PRESERVATIVE FREE 0.5 ML SUSY Inject 6 Units as directed as directed.  0  . NOVOLOG 100 UNIT/ML injection INJECT 10 TO 15 UNITS SUBCUTANEOUSLY THREE TIMES DAILY WITH MEALS 20 mL 1   No facility-administered medications prior to visit.    No Known Allergies  Review of Systems  Constitutional: Negative for fever and malaise/fatigue.  HENT: Negative for congestion.   Eyes: Negative for blurred vision.  Respiratory: Negative for shortness of breath.   Cardiovascular: Negative for chest pain, palpitations and leg swelling.  Gastrointestinal: Negative for nausea, abdominal pain and blood in stool.  Genitourinary: Negative for dysuria and frequency.  Musculoskeletal: Positive for back pain. Negative for falls.  Skin: Negative for rash.    Neurological: Negative for dizziness, loss of consciousness and headaches.  Endo/Heme/Allergies: Negative for environmental allergies.  Psychiatric/Behavioral: Negative for depression. The patient is not nervous/anxious.        Objective:    Physical Exam  Constitutional: He is oriented to person, place, and time. He appears well-developed and well-nourished. No distress.  HENT:  Head: Normocephalic and atraumatic.  Nose: Nose normal.  Eyes: Right eye exhibits no discharge. Left eye exhibits no discharge.  Neck: Normal range of motion. Neck supple.  Cardiovascular: Normal rate and regular rhythm.   No murmur heard. Pulmonary/Chest: Effort normal and breath sounds normal.  Abdominal: Soft. Bowel sounds are normal. There is no tenderness.  Musculoskeletal: He exhibits tenderness. He exhibits no edema.  Tender with palp over muscles of left flank  Neurological: He is alert and oriented to person, place, and time.  Skin: Skin is warm and dry.  Psychiatric: He has a normal mood and affect.  Nursing note and vitals reviewed.   BP 122/80 mmHg  Pulse 68  Temp(Src) 98 F (36.7 C) (Oral)  Ht 5' 7.5" (1.715 m)  Wt 188 lb (85.276 kg)  BMI 28.99 kg/m2  SpO2 98% Wt Readings from Last 3 Encounters:  09/12/15 188 lb (85.276 kg)  06/21/15 189 lb (85.73 kg)  02/15/15 178 lb 8 oz (80.967 kg)     Lab Results  Component Value Date   WBC 9.7 09/12/2015   HGB 13.5 09/12/2015   HCT 40.7 09/12/2015   PLT 208.0 09/12/2015   GLUCOSE 29* 09/12/2015   CHOL 180 06/21/2015   TRIG 62.0 06/21/2015   HDL 64.40 06/21/2015   LDLDIRECT 71 09/10/2008   LDLCALC 104* 06/21/2015   ALT 13 09/12/2015   AST 18 09/12/2015   NA 140 09/12/2015   K 4.7 09/12/2015   CL 103 09/12/2015   CREATININE 0.97 09/12/2015   BUN 22 09/12/2015   CO2 32 09/12/2015   TSH 1.26 06/21/2015   PSA 0.61 02/15/2014   HGBA1C 7.1* 06/21/2015   MICROALBUR <0.7 02/15/2015    Lab Results  Component Value Date   TSH  1.26 06/21/2015   Lab Results  Component Value Date   WBC 9.7 09/12/2015   HGB 13.5 09/12/2015   HCT 40.7 09/12/2015   MCV 87.0 09/12/2015   PLT 208.0 09/12/2015   Lab Results  Component Value Date   NA 140 09/12/2015   K 4.7 09/12/2015   CO2 32 09/12/2015  GLUCOSE 29* 09/12/2015   BUN 22 09/12/2015   CREATININE 0.97 09/12/2015   BILITOT 0.4 09/12/2015   ALKPHOS 62 09/12/2015   AST 18 09/12/2015   ALT 13 09/12/2015   PROT 7.2 09/12/2015   ALBUMIN 4.3 09/12/2015   CALCIUM 9.7 09/12/2015   GFR 85.34 09/12/2015   Lab Results  Component Value Date   CHOL 180 06/21/2015   Lab Results  Component Value Date   HDL 64.40 06/21/2015   Lab Results  Component Value Date   LDLCALC 104* 06/21/2015   Lab Results  Component Value Date   TRIG 62.0 06/21/2015   Lab Results  Component Value Date   CHOLHDL 3 06/21/2015   Lab Results  Component Value Date   HGBA1C 7.1* 06/21/2015       Assessment & Plan:   Problem List Items Addressed This Visit    Left flank pain - Primary    Patient was concerned it was kidney related but his symptoms are c/w musculoskeletal cause. Labs reviewed no concerns. Encouraged topical treatments and may use OTC meds prn      Relevant Orders   CBC w/Diff (Completed)   Comp Met (CMET) (Completed)   Hemoglobin A1c   Lipid panel   Comprehensive metabolic panel   CBC   TSH   DG Lumbar Spine Complete   Hyperlipidemia    Tolerating statin, encouraged heart healthy diet, avoid trans fats, minimize simple carbs and saturated fats. Increase exercise as tolerated      Relevant Orders   Hemoglobin A1c   Lipid panel   Comprehensive metabolic panel   CBC   TSH   Essential hypertension    Well controlled, no changes to meds. Encouraged heart healthy diet such as the DASH diet and exercise as tolerated.       Relevant Orders   Hemoglobin A1c   Lipid panel   Comprehensive metabolic panel   CBC   TSH   DM (diabetes mellitus), type 1 with  ophthalmic complications ([facility])    CHJS4B acceptable, minimize simple carbs. Increase exercise as tolerated. Labs revewled hypoglycemia but patient was asymptomatic. Continue current meds, monitor sugars closely      Relevant Medications   insulin detemir (LEVEMIR) 100 UNIT/ML injection   Other Relevant Orders   Hemoglobin A1c   Lipid panel   Comprehensive metabolic panel   CBC   TSH   Back pain without radiation    Encouraged moist heat and gentle stretching as tolerated. May try NSAIDs and prescription meds as directed and report if symptoms worsen or seek immediate care      Asthma   Relevant Orders   Hemoglobin A1c   Lipid panel   Comprehensive metabolic panel   CBC   TSH      I have discontinued Mr. Pavich AFLURIA PRESERVATIVE FREE and NOVOLOG. I am also having him maintain his Vitamin D3, aspirin, insulin aspart, lisinopril, tadalafil, simvastatin, and insulin detemir.  Meds ordered this encounter  Medications  . insulin detemir (LEVEMIR) 100 UNIT/ML injection    Sig: Inject 0.1 mLs (10 Units total) into the skin at bedtime.    Dispense:  10 mL    Refill:  5     Penni Homans, MD

## 2015-09-22 NOTE — Assessment & Plan Note (Addendum)
hgba1c acceptable, minimize simple carbs. Increase exercise as tolerated. Labs revewled hypoglycemia but patient was asymptomatic. Continue current meds, monitor sugars closely

## 2015-09-22 NOTE — Assessment & Plan Note (Signed)
Patient was concerned it was kidney related but his symptoms are c/w musculoskeletal cause. Labs reviewed no concerns. Encouraged topical treatments and may use OTC meds prn

## 2015-09-22 NOTE — Assessment & Plan Note (Signed)
Encouraged moist heat and gentle stretching as tolerated. May try NSAIDs and prescription meds as directed and report if symptoms worsen or seek immediate care 

## 2015-09-22 NOTE — Assessment & Plan Note (Signed)
Tolerating statin, encouraged heart healthy diet, avoid trans fats, minimize simple carbs and saturated fats. Increase exercise as tolerated 

## 2015-10-30 ENCOUNTER — Other Ambulatory Visit: Payer: Self-pay | Admitting: Family Medicine

## 2015-11-02 ENCOUNTER — Other Ambulatory Visit: Payer: Self-pay | Admitting: Family Medicine

## 2015-12-06 ENCOUNTER — Encounter: Payer: Self-pay | Admitting: Family Medicine

## 2015-12-06 ENCOUNTER — Ambulatory Visit (INDEPENDENT_AMBULATORY_CARE_PROVIDER_SITE_OTHER): Payer: BLUE CROSS/BLUE SHIELD | Admitting: Family Medicine

## 2015-12-06 VITALS — BP 122/74 | HR 60 | Temp 97.9°F | Wt 187.4 lb

## 2015-12-06 DIAGNOSIS — I10 Essential (primary) hypertension: Secondary | ICD-10-CM | POA: Diagnosis not present

## 2015-12-06 DIAGNOSIS — E103292 Type 1 diabetes mellitus with mild nonproliferative diabetic retinopathy without macular edema, left eye: Secondary | ICD-10-CM

## 2015-12-06 DIAGNOSIS — Z0000 Encounter for general adult medical examination without abnormal findings: Secondary | ICD-10-CM | POA: Diagnosis not present

## 2015-12-06 DIAGNOSIS — D649 Anemia, unspecified: Secondary | ICD-10-CM

## 2015-12-06 DIAGNOSIS — E785 Hyperlipidemia, unspecified: Secondary | ICD-10-CM

## 2015-12-06 DIAGNOSIS — R6882 Decreased libido: Secondary | ICD-10-CM

## 2015-12-06 DIAGNOSIS — L578 Other skin changes due to chronic exposure to nonionizing radiation: Secondary | ICD-10-CM

## 2015-12-06 HISTORY — DX: Other skin changes due to chronic exposure to nonionizing radiation: L57.8

## 2015-12-06 LAB — LIPID PANEL
CHOL/HDL RATIO: 3
CHOLESTEROL: 172 mg/dL (ref 0–200)
HDL: 53 mg/dL (ref >39.00)
LDL Cholesterol: 104 mg/dL — ABNORMAL HIGH (ref 0–99)
NonHDL: 119.39
TRIGLYCERIDES: 76 mg/dL (ref 0.0–149.0)
VLDL: 15.2 mg/dL (ref 0.0–40.0)

## 2015-12-06 LAB — COMPREHENSIVE METABOLIC PANEL
ALBUMIN: 4.3 g/dL (ref 3.5–5.2)
ALT: 14 U/L (ref 0–53)
AST: 17 U/L (ref 0–37)
Alkaline Phosphatase: 72 U/L (ref 39–117)
BILIRUBIN TOTAL: 0.6 mg/dL (ref 0.2–1.2)
BUN: 19 mg/dL (ref 6–23)
CALCIUM: 8.8 mg/dL (ref 8.4–10.5)
CHLORIDE: 102 meq/L (ref 96–112)
CO2: 29 meq/L (ref 19–32)
CREATININE: 0.84 mg/dL (ref 0.40–1.50)
GFR: 100.67 mL/min (ref >60.00)
Glucose, Bld: 146 mg/dL — ABNORMAL HIGH (ref 70–99)
Potassium: 4.5 mEq/L (ref 3.5–5.1)
Sodium: 137 mEq/L (ref 135–145)
Total Protein: 7.1 g/dL (ref 6.0–8.3)

## 2015-12-06 LAB — CBC
HCT: 39 % (ref 39.0–52.0)
HEMOGLOBIN: 12.9 g/dL — AB (ref 13.0–17.0)
MCHC: 33.1 g/dL (ref 30.0–36.0)
MCV: 88.3 fl (ref 78.0–100.0)
PLATELETS: 203 10*3/uL (ref 150.0–400.0)
RBC: 4.42 Mil/uL (ref 4.22–5.81)
RDW: 12.9 % (ref 11.5–15.5)
WBC: 7.8 10*3/uL (ref 4.0–10.5)

## 2015-12-06 LAB — TSH: TSH: 1.12 u[IU]/mL (ref 0.35–4.50)

## 2015-12-06 LAB — HEMOGLOBIN A1C: Hgb A1c MFr Bld: 6.5 % (ref 4.6–6.5)

## 2015-12-06 MED ORDER — CEPHALEXIN 500 MG PO CAPS: 500.0000 mg | ORAL_CAPSULE | Freq: Three times a day (TID) | ORAL | 0 refills | Status: DC

## 2015-12-06 MED ORDER — LISINOPRIL 20 MG PO TABS: 20.0000 mg | ORAL_TABLET | Freq: Two times a day (BID) | ORAL | 1 refills | Status: DC

## 2015-12-06 MED ORDER — INSULIN ASPART 100 UNIT/ML ~~LOC~~ SOLN: 7.0000 [IU] | Freq: Three times a day (TID) | SUBCUTANEOUS | 5 refills | Status: DC

## 2015-12-06 NOTE — Patient Instructions (Signed)
NOW 10 strain 1 cap daily can get at [facility], Male A healthy lifestyle and preventive care can promote health and wellness. Preventive health guidelines for men include the following key practices:  A routine yearly physical is a good way to check with your health care provider about your health and preventative screening. It is a chance to share any concerns and updates on your health and to receive a thorough exam.  Visit your dentist for a routine exam and preventative care every 6 months. Brush your teeth twice a day and floss once a day. Good oral hygiene prevents tooth decay and gum disease.  The frequency of eye exams is based on your age, health, family medical history, use of contact lenses, and other factors. Follow your health care provider's recommendations for frequency of eye exams.  Eat a healthy diet. Foods such as vegetables, fruits, whole grains, low-fat dairy products, and lean protein foods contain the nutrients you need without too many calories. Decrease your intake of foods high in solid fats, added sugars, and salt. Eat the right amount of calories for you.Get information about a proper diet from your health care provider, if necessary.  Regular physical exercise is one of the most important things you can do for your health. Most adults should get at least 150 minutes of moderate-intensity exercise (any activity that increases your heart rate and causes you to sweat) each week. In addition, most adults need muscle-strengthening exercises on 2 or more days a week.  Maintain a healthy weight. The body mass index (BMI) is a screening tool to identify possible weight problems. It provides an estimate of body fat based on height and weight. Your health care provider can find your BMI and can help you achieve or maintain a healthy weight.For adults 20 years and older:  A BMI below 18.5 is considered underweight.  A BMI of 18.5 to 24.9  is normal.  A BMI of 25 to 29.9 is considered overweight.  A BMI of 30 and above is considered obese.  Maintain normal blood lipids and cholesterol levels by exercising and minimizing your intake of saturated fat. Eat a balanced diet with plenty of fruit and vegetables. Blood tests for lipids and cholesterol should begin at age 59 and be repeated every 5 years. If your lipid or cholesterol levels are high, you are over 50, or you are at high risk for heart disease, you may need your cholesterol levels checked more frequently.Ongoing high lipid and cholesterol levels should be treated with medicines if diet and exercise are not working.  If you smoke, find out from your health care provider how to quit. If you do not use tobacco, do not start.  Lung cancer screening is recommended for adults aged 55-80 years who are at high risk for developing lung cancer because of a history of smoking. A yearly low-dose CT scan of the lungs is recommended for people who have at least a 30-pack-year history of smoking and are a current smoker or have quit within the past 15 years. A pack year of smoking is smoking an average of 1 pack of cigarettes a day for 1 year (for example: 1 pack a day for 30 years or 2 packs a day for 15 years). Yearly screening should continue until the smoker has stopped smoking for at least 15 years. Yearly screening should be stopped for people who develop a health problem that would prevent them from having lung  cancer treatment.  If you choose to drink alcohol, do not have more than 2 drinks per day. One drink is considered to be 12 ounces (355 mL) of beer, 5 ounces (148 mL) of wine, or 1.5 ounces (44 mL) of liquor.  Avoid use of street drugs. Do not share needles with anyone. Ask for help if you need support or instructions about stopping the use of drugs.  High blood pressure causes heart disease and increases the risk of stroke. Your blood pressure should be checked at least every  1-2 years. Ongoing high blood pressure should be treated with medicines, if weight loss and exercise are not effective.  If you are 61-54 years old, ask your health care provider if you should take aspirin to prevent heart disease.  Diabetes screening is done by taking a blood sample to check your blood glucose level after you have not eaten for a certain period of time (fasting). If you are not overweight and you do not have risk factors for diabetes, you should be screened once every 3 years starting at age 42. If you are overweight or obese and you are 77-27 years of age, you should be screened for diabetes every year as part of your cardiovascular risk assessment.  Colorectal cancer can be detected and often prevented. Most routine colorectal cancer screening begins at the age of 27 and continues through age 58. However, your health care provider may recommend screening at an earlier age if you have risk factors for colon cancer. On a yearly basis, your health care provider may provide home test kits to check for hidden blood in the stool. Use of a small camera at the end of a tube to directly examine the colon (sigmoidoscopy or colonoscopy) can detect the earliest forms of colorectal cancer. Talk to your health care provider about this at age 73, when routine screening begins. Direct exam of the colon should be repeated every 5-10 years through age 79, unless early forms of precancerous polyps or small growths are found.  People who are at an increased risk for hepatitis B should be screened for this virus. You are considered at high risk for hepatitis B if:  You were born in a country where hepatitis B occurs often. Talk with your health care provider about which countries are considered high risk.  Your parents were born in a high-risk country and you have not received a shot to protect against hepatitis B (hepatitis B vaccine).  You have HIV or AIDS.  You use needles to inject street  drugs.  You live with, or have sex with, someone who has hepatitis B.  You are a man who has sex with other men (MSM).  You get hemodialysis treatment.  You take certain medicines for conditions such as cancer, organ transplantation, and autoimmune conditions.  Hepatitis C blood testing is recommended for all people born from [birth year] through 1965 and any individual with known risks for hepatitis C.  Practice safe sex. Use condoms and avoid high-risk sexual practices to reduce the spread of sexually transmitted infections (STIs). STIs include gonorrhea, chlamydia, syphilis, trichomonas, herpes, HPV, and human immunodeficiency virus (HIV). Herpes, HIV, and HPV are viral illnesses that have no cure. They can result in disability, cancer, and death.  If you are a man who has sex with other men, you should be screened at least once per year for:  HIV.  Urethral, rectal, and pharyngeal infection of gonorrhea, chlamydia, or both.  If you are at  risk of being infected with HIV, it is recommended that you take a prescription medicine daily to prevent HIV infection. This is called preexposure prophylaxis (PrEP). You are considered at risk if:  You are a man who has sex with other men (MSM) and have other risk factors.  You are a heterosexual man, are sexually active, and are at increased risk for HIV infection.  You take drugs by injection.  You are sexually active with a partner who has HIV.  Talk with your health care provider about whether you are at high risk of being infected with HIV. If you choose to begin PrEP, you should first be tested for HIV. You should then be tested every 3 months for as long as you are taking PrEP.  A one-time screening for abdominal aortic aneurysm (AAA) and surgical repair of large AAAs by ultrasound are recommended for men ages 42 to 30 years who are current or former smokers.  Healthy men should no longer receive prostate-specific antigen (PSA) blood tests as  part of routine cancer screening. Talk with your health care provider about prostate cancer screening.  Testicular cancer screening is not recommended for adult males who have no symptoms. Screening includes self-exam, a health care provider exam, and other screening tests. Consult with your health care provider about any symptoms you have or any concerns you have about testicular cancer.  Use sunscreen. Apply sunscreen liberally and repeatedly throughout the day. You should seek shade when your shadow is shorter than you. Protect yourself by wearing long sleeves, pants, a wide-brimmed hat, and sunglasses year round, whenever you are outdoors.  Once a month, do a whole-body skin exam, using a mirror to look at the skin on your back. Tell your health care provider about new moles, moles that have irregular borders, moles that are larger than a pencil eraser, or moles that have changed in shape or color.  Stay current with required vaccines (immunizations).  Influenza vaccine. All adults should be immunized every year.  Tetanus, diphtheria, and acellular pertussis (Td, Tdap) vaccine. An adult who has not previously received Tdap or who does not know his vaccine status should receive 1 dose of Tdap. This initial dose should be followed by tetanus and diphtheria toxoids (Td) booster doses every 10 years. Adults with an unknown or incomplete history of completing a 3-dose immunization series with Td-containing vaccines should begin or complete a primary immunization series including a Tdap dose. Adults should receive a Td booster every 10 years.  Varicella vaccine. An adult without evidence of immunity to varicella should receive 2 doses or a second dose if he has previously received 1 dose.  Human papillomavirus (HPV) vaccine. Males aged 11-21 years who have not received the vaccine previously should receive the 3-dose series. Males aged 22-26 years may be immunized. Immunization is recommended through  the age of 9 years for any male who has sex with males and did not get any or all doses earlier. Immunization is recommended for any person with an immunocompromised condition through the age of 35 years if he did not get any or all doses earlier. During the 3-dose series, the second dose should be obtained 4-8 weeks after the first dose. The third dose should be obtained 24 weeks after the first dose and 16 weeks after the second dose.  Zoster vaccine. One dose is recommended for adults aged 58 years or older unless certain conditions are present.  Measles, mumps, and rubella (MMR) vaccine. Adults born before  1957 generally are considered immune to measles and mumps. Adults born in [birth year] or later should have 1 or more doses of MMR vaccine unless there is a contraindication to the vaccine or there is laboratory evidence of immunity to each of the three diseases. A routine second dose of MMR vaccine should be obtained at least 28 days after the first dose for students attending postsecondary schools, health care workers, or international travelers. People who received inactivated measles vaccine or an unknown type of measles vaccine during 1963-1967 should receive 2 doses of MMR vaccine. People who received inactivated mumps vaccine or an unknown type of mumps vaccine before 1979 and are at high risk for mumps infection should consider immunization with 2 doses of MMR vaccine. Unvaccinated health care workers born before [birth year] who lack laboratory evidence of measles, mumps, or rubella immunity or laboratory confirmation of disease should consider measles and mumps immunization with 2 doses of MMR vaccine or rubella immunization with 1 dose of MMR vaccine.  Pneumococcal 13-valent conjugate (PCV13) vaccine. When indicated, a person who is uncertain of his immunization history and has no record of immunization should receive the PCV13 vaccine. All adults 71 years of age and older should receive this vaccine. An  adult aged 69 years or older who has certain medical conditions and has not been previously immunized should receive 1 dose of PCV13 vaccine. This PCV13 should be followed with a dose of pneumococcal polysaccharide (PPSV23) vaccine. Adults who are at high risk for pneumococcal disease should obtain the PPSV23 vaccine at least 8 weeks after the dose of PCV13 vaccine. Adults older than 55 years of age who have normal immune system function should obtain the PPSV23 vaccine dose at least 1 year after the dose of PCV13 vaccine.  Pneumococcal polysaccharide (PPSV23) vaccine. When PCV13 is also indicated, PCV13 should be obtained first. All adults aged 42 years and older should be immunized. An adult younger than age 19 years who has certain medical conditions should be immunized. Any person who resides in a nursing home or long-term care facility should be immunized. An adult smoker should be immunized. People with an immunocompromised condition and certain other conditions should receive both PCV13 and PPSV23 vaccines. People with human immunodeficiency virus (HIV) infection should be immunized as soon as possible after diagnosis. Immunization during chemotherapy or radiation therapy should be avoided. Routine use of PPSV23 vaccine is not recommended for American Indians, [place] Natives, or people younger than 65 years unless there are medical conditions that require PPSV23 vaccine. When indicated, people who have unknown immunization and have no record of immunization should receive PPSV23 vaccine. One-time revaccination 5 years after the first dose of PPSV23 is recommended for people aged 19-64 years who have chronic kidney failure, nephrotic syndrome, asplenia, or immunocompromised conditions. People who received 1-2 doses of PPSV23 before age 67 years should receive another dose of PPSV23 vaccine at age 65 years or later if at least 5 years have passed since the previous dose. Doses of PPSV23 are not needed for  people immunized with PPSV23 at or after age 62 years.  Meningococcal vaccine. Adults with asplenia or persistent complement component deficiencies should receive 2 doses of quadrivalent meningococcal conjugate (MenACWY-D) vaccine. The doses should be obtained at least 2 months apart. Microbiologists working with certain meningococcal bacteria, [employer] recruits, people at risk during an outbreak, and people who travel to or live in countries with a high rate of meningitis should be immunized. A first-year college student up through  age 44 years who is living in a residence hall should receive a dose if he did not receive a dose on or after his 16th birthday. Adults who have certain high-risk conditions should receive one or more doses of vaccine.  Hepatitis A vaccine. Adults who wish to be protected from this disease, have chronic liver disease, work with hepatitis A-infected animals, work in hepatitis A research labs, or travel to or work in countries with a high rate of hepatitis A should be immunized. Adults who were previously unvaccinated and who anticipate close contact with an international adoptee during the first 60 days after arrival in the [place] from a country with a high rate of hepatitis A should be immunized.  Hepatitis B vaccine. Adults should be immunized if they wish to be protected from this disease, are under age 41 years and have diabetes, have chronic liver disease, have had more than one sex partner in the past 6 months, may be exposed to blood or other infectious body fluids, are household contacts or sex partners of hepatitis B positive people, are clients or workers in certain care facilities, or travel to or work in countries with a high rate of hepatitis B.  Haemophilus influenzae type b (Hib) vaccine. A previously unvaccinated person with asplenia or sickle cell disease or having a scheduled splenectomy should receive 1 dose of Hib vaccine. Regardless of previous  immunization, a recipient of a hematopoietic stem cell transplant should receive a 3-dose series 6-12 months after his successful transplant. Hib vaccine is not recommended for adults with HIV infection. Preventive Service / Frequency Ages 2 to 41  Blood pressure check.** / Every 3-5 years.  Lipid and cholesterol check.** / Every 5 years beginning at age 65.  Hepatitis C blood test.** / For any individual with known risks for hepatitis C.  Skin self-exam. / Monthly.  Influenza vaccine. / Every year.  Tetanus, diphtheria, and acellular pertussis (Tdap, Td) vaccine.** / Consult your health care provider. 1 dose of Td every 10 years.  Varicella vaccine.** / Consult your health care provider.  HPV vaccine. / 3 doses over 6 months, if 43 or younger.  Measles, mumps, rubella (MMR) vaccine.** / You need at least 1 dose of MMR if you were born in 1957 or later. You may also need a second dose.  Pneumococcal 13-valent conjugate (PCV13) vaccine.** / Consult your health care provider.  Pneumococcal polysaccharide (PPSV23) vaccine.** / 1 to 2 doses if you smoke cigarettes or if you have certain conditions.  Meningococcal vaccine.** / 1 dose if you are age 29 to 34 years and a Market researcher living in a residence hall, or have one of several medical conditions. You may also need additional booster doses.  Hepatitis A vaccine.** / Consult your health care provider.  Hepatitis B vaccine.** / Consult your health care provider.  Haemophilus influenzae type b (Hib) vaccine.** / Consult your health care provider. Ages 77 to 12  Blood pressure check.** / Every year.  Lipid and cholesterol check.** / Every 5 years beginning at age 53.  Lung cancer screening. / Every year if you are aged 13-80 years and have a 30-pack-year history of smoking and currently smoke or have quit within the past 15 years. Yearly screening is stopped once you have quit smoking for at least 15 years or develop  a health problem that would prevent you from having lung cancer treatment.  Fecal occult blood test (FOBT) of stool. / Every year beginning at  age 66 and continuing until age 75. You may not have to do this test if you get a colonoscopy every 10 years.  Flexible sigmoidoscopy** or colonoscopy.** / Every 5 years for a flexible sigmoidoscopy or every 10 years for a colonoscopy beginning at age 50 and continuing until age 66.  Hepatitis C blood test.** / For all people born from [birth year] through 1965 and any individual with known risks for hepatitis C.  Skin self-exam. / Monthly.  Influenza vaccine. / Every year.  Tetanus, diphtheria, and acellular pertussis (Tdap/Td) vaccine.** / Consult your health care provider. 1 dose of Td every 10 years.  Varicella vaccine.** / Consult your health care provider.  Zoster vaccine.** / 1 dose for adults aged 52 years or older.  Measles, mumps, rubella (MMR) vaccine.** / You need at least 1 dose of MMR if you were born in 1957 or later. You may also need a second dose.  Pneumococcal 13-valent conjugate (PCV13) vaccine.** / Consult your health care provider.  Pneumococcal polysaccharide (PPSV23) vaccine.** / 1 to 2 doses if you smoke cigarettes or if you have certain conditions.  Meningococcal vaccine.** / Consult your health care provider.  Hepatitis A vaccine.** / Consult your health care provider.  Hepatitis B vaccine.** / Consult your health care provider.  Haemophilus influenzae type b (Hib) vaccine.** / Consult your health care provider. Ages 28 and over  Blood pressure check.** / Every year.  Lipid and cholesterol check.**/ Every 5 years beginning at age 49.  Lung cancer screening. / Every year if you are aged 35-80 years and have a 30-pack-year history of smoking and currently smoke or have quit within the past 15 years. Yearly screening is stopped once you have quit smoking for at least 15 years or develop a health problem that would prevent  you from having lung cancer treatment.  Fecal occult blood test (FOBT) of stool. / Every year beginning at age 45 and continuing until age 55. You may not have to do this test if you get a colonoscopy every 10 years.  Flexible sigmoidoscopy** or colonoscopy.** / Every 5 years for a flexible sigmoidoscopy or every 10 years for a colonoscopy beginning at age 72 and continuing until age 18.  Hepatitis C blood test.** / For all people born from [birth year] through 1965 and any individual with known risks for hepatitis C.  Abdominal aortic aneurysm (AAA) screening.** / A one-time screening for ages 21 to 13 years who are current or former smokers.  Skin self-exam. / Monthly.  Influenza vaccine. / Every year.  Tetanus, diphtheria, and acellular pertussis (Tdap/Td) vaccine.** / 1 dose of Td every 10 years.  Varicella vaccine.** / Consult your health care provider.  Zoster vaccine.** / 1 dose for adults aged 104 years or older.  Pneumococcal 13-valent conjugate (PCV13) vaccine.** / 1 dose for all adults aged 64 years and older.  Pneumococcal polysaccharide (PPSV23) vaccine.** / 1 dose for all adults aged 64 years and older.  Meningococcal vaccine.** / Consult your health care provider.  Hepatitis A vaccine.** / Consult your health care provider.  Hepatitis B vaccine.** / Consult your health care provider.  Haemophilus influenzae type b (Hib) vaccine.** / Consult your health care provider. **Family history and personal history of risk and conditions may change your health care provider's recommendations.   This information is not intended to replace advice given to you by your health care provider. Make sure you discuss any questions you have with your health care provider.   Document  Released: 05/19/2001 Document Revised: 04/13/2014 Document Reviewed: 08/18/2010 Elsevier Interactive Patient Education [employer].

## 2015-12-06 NOTE — Assessment & Plan Note (Signed)
Well controlled, no changes to meds. Encouraged heart healthy diet such as the DASH diet and exercise as tolerated.  °

## 2015-12-06 NOTE — Progress Notes (Signed)
Pre visit review using our clinic review tool, if applicable. No additional management support is needed unless otherwise documented below in the visit note. 

## 2015-12-06 NOTE — Assessment & Plan Note (Signed)
Patient encouraged to maintain heart healthy diet, regular exercise, adequate sleep. Consider daily probiotics. Take medications as prescribed. Given and reviewed copy of ACP documents from [place] Secretary of State and encouraged to complete and return 

## 2015-12-06 NOTE — Assessment & Plan Note (Signed)
Tolerating statin, encouraged heart healthy diet, avoid trans fats, minimize simple carbs and saturated fats. Increase exercise as tolerated 

## 2015-12-06 NOTE — Assessment & Plan Note (Signed)
Testosterone normal on last check has been seen by urology they tried Cialis 5 mg daily x 4 days without improvement but will follow up with urology. viagra caused a headache

## 2015-12-06 NOTE — Assessment & Plan Note (Signed)
Lesion on right pinna this past year and sun damage. Will refer to dermatology for evaluation

## 2015-12-06 NOTE — Progress Notes (Signed)
Patient ID: Scott Adkins, male   DOB: 1960/12/04, 55 y.o.   MRN: [id number]   Subjective:    Patient ID: Scott Adkins, male    DOB: [birth year], 55 y.o.   MRN: [id number]  Chief Complaint  Patient presents with  . Annual Exam    HPI Patient is in today for annual preventative exam and follow up on numerous medical concerns. He feels well today, no recent illness or hospitalizations. Is following with urology. Denies polyuria or polydipsia and reports sugars have been well controlled. Has been trying to maintain a heart healthy diet to help HTN an dhi cholesterol. No flare in asthma or allergies recently. Denies CP/palp/SOB/HA/congestion/fevers/GI or GU c/o. Taking meds as prescribed  Past Medical History:  Diagnosis Date  . Anemia 12/22/2015  . Arthritis   . Chicken pox as a child  . Diabetes mellitus type I (HCC)    uses insulin pump// managed by Dr. Casimiro Needle Altheimer  . 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 Hyperlipidemia   . Hypertension   . Injury of right rotator cuff 08/16/2012   Has been seen by [facility]  . Left flank pain 09/12/2015  . Mumps as a child  . Preventative health care 02/19/2013  . Sun-damaged skin 12/06/2015    Past Surgical History:  Procedure Laterality Date  . insulin pump     placed and removed  . REFRACTIVE SURGERY  2007   [facility]  . ROTATOR CUFF REPAIR  2004   left    Family History  Problem Relation Age of Onset  . Hypertension    . Stroke    . Colon cancer Neg Hx   . Prostate cancer Neg Hx   . Breast cancer Neg Hx   . Rectal cancer Neg Hx   . Hypertension Mother   . Stroke Father   . Hypertension Father   . Hyperlipidemia Father   . Hypertension Brother   . Hyperlipidemia Brother   . Mental illness Brother     depression  . Heart disease Maternal Grandmother     MI  . Heart disease Maternal Grandfather     MI  . Hyperlipidemia Brother   . Hypertension Brother   . Diabetes Neg Hx      Social History   Social History  . Marital status: Married    Spouse name: N/A  . Number of children: N/A  . Years of education: N/A   Occupational History  . Not on file.   Social History Main Topics  . Smoking status: Never Smoker  . Smokeless tobacco: Never Used  . Alcohol use 0.6 oz/week    1 Shots of liquor per week     Comment: Socially  . Drug use: No  . Sexual activity: Not on file     Comment: lives with wife and mother in law, no dietary restrictions   Other Topics Concern  . Not on file   Social History Narrative   Married 14 yrs   2 daughters; 1 son   Occupation: Time Physiological scientist          Outpatient Medications Prior to Visit  Medication Sig Dispense Refill  . aspirin 81 MG tablet Take 81 mg by mouth daily.    . Cholecalciferol (VITAMIN D3) 1000 UNITS CAPS Take 2,000 Units by mouth daily.    . insulin detemir (LEVEMIR) 100 UNIT/ML injection Inject 0.1 mLs (10 Units total) into the skin at bedtime. 10 mL  5  . NOVOLOG 100 UNIT/ML injection INJECT 10 TO 15 UNITS SUBCUTANEOUSLY THREE TIMES DAILY WITH MEALS 20 mL 5  . simvastatin (ZOCOR) 40 MG tablet TAKE ONE TABLET BY MOUTH ONCE DAILY 90 tablet 2  . tadalafil (CIALIS) 5 MG tablet Take 5 mg by mouth. Reported on 07/25/2015    . insulin aspart (NOVOLOG) 100 UNIT/ML injection Inject 15-20 Units into the skin 3 (three) times daily with meals. 120 mL 5  . LEVEMIR 100 UNIT/ML injection INJECT 0.1MLS (10 UNITS TOTAL) INTO THE SKIN AT BEDTIME 10 mL 6  . lisinopril (PRINIVIL,ZESTRIL) 20 MG tablet Take 1 tablet (20 mg total) by mouth 2 (two) times daily. 180 tablet 1  . lisinopril (PRINIVIL,ZESTRIL) 20 MG tablet TAKE ONE TABLET BY MOUTH ONCE DAILY 90 tablet 0   No facility-administered medications prior to visit.     Allergies  Allergen Reactions  . Viagra [Sildenafil Citrate] Other (See Comments)    headache    Review of Systems  Constitutional: Negative for chills, fever and malaise/fatigue.  HENT:  Negative for congestion and hearing loss.   Eyes: Negative for discharge.  Respiratory: Negative for cough, sputum production and shortness of breath.   Cardiovascular: Positive for leg swelling. Negative for chest pain and palpitations.  Gastrointestinal: Negative for abdominal pain, blood in stool, constipation, diarrhea, heartburn, nausea and vomiting.  Genitourinary: Negative for dysuria, frequency, hematuria and urgency.  Musculoskeletal: Negative for back pain, falls and myalgias.  Skin: Positive for rash.  Neurological: Negative for dizziness, sensory change, loss of consciousness, weakness and headaches.  Endo/Heme/Allergies: Negative for environmental allergies. Does not bruise/bleed easily.  Psychiatric/Behavioral: Negative for depression and suicidal ideas. The patient is not nervous/anxious and does not have insomnia.        Objective:    Physical Exam  Constitutional: He is oriented to person, place, and time. He appears well-developed and well-nourished. No distress.  HENT:  Head: Normocephalic and atraumatic.  Eyes: Conjunctivae are normal.  Neck: Neck supple. No thyromegaly present.  Cardiovascular: Normal rate, regular rhythm and normal heart sounds.   No murmur heard. Pulmonary/Chest: Effort normal and breath sounds normal. No respiratory distress. He has no wheezes.  Abdominal: Soft. Bowel sounds are normal. He exhibits no mass. There is no tenderness.  Musculoskeletal: He exhibits no edema.  Lymphadenopathy:    He has no cervical adenopathy.  Neurological: He is alert and oriented to person, place, and time.  Skin: Skin is warm and dry.  Left anterior ankle lesion from boots. 1 cm x 1/2 cm central maceration, then surrounded by erythema 1.5 cm x 1 cm area. Foot slightly swollen as well. No radiating erythema Right ear, rough raised patch top of Pinna  Psychiatric: He has a normal mood and affect. His behavior is normal.    BP 122/74 (BP Location: Left Arm,  Patient Position: Sitting, Cuff Size: Normal)   Pulse 60   Temp 97.9 F (36.6 C) (Oral)   Wt 187 lb 6.4 oz (85 kg)   SpO2 99%   BMI 28.92 kg/m  Wt Readings from Last 3 Encounters:  12/06/15 187 lb 6.4 oz (85 kg)  09/12/15 188 lb (85.3 kg)  06/21/15 189 lb (85.7 kg)     Lab Results  Component Value Date   WBC 7.8 12/06/2015   HGB 12.9 (L) 12/06/2015   HCT 39.0 12/06/2015   PLT 203.0 12/06/2015   GLUCOSE 146 (H) 12/06/2015   CHOL 172 12/06/2015   TRIG 76.0 12/06/2015   HDL  53.00 12/06/2015   LDLDIRECT 71 09/10/2008   LDLCALC 104 (H) 12/06/2015   ALT 14 12/06/2015   AST 17 12/06/2015   NA 137 12/06/2015   K 4.5 12/06/2015   CL 102 12/06/2015   CREATININE 0.84 12/06/2015   BUN 19 12/06/2015   CO2 29 12/06/2015   TSH 1.12 12/06/2015   PSA 0.61 02/15/2014   HGBA1C 6.5 12/06/2015   MICROALBUR <0.7 02/15/2015    Lab Results  Component Value Date   TSH 1.12 12/06/2015   Lab Results  Component Value Date   WBC 7.8 12/06/2015   HGB 12.9 (L) 12/06/2015   HCT 39.0 12/06/2015   MCV 88.3 12/06/2015   PLT 203.0 12/06/2015   Lab Results  Component Value Date   NA 137 12/06/2015   K 4.5 12/06/2015   CO2 29 12/06/2015   GLUCOSE 146 (H) 12/06/2015   BUN 19 12/06/2015   CREATININE 0.84 12/06/2015   BILITOT 0.6 12/06/2015   ALKPHOS 72 12/06/2015   AST 17 12/06/2015   ALT 14 12/06/2015   PROT 7.1 12/06/2015   ALBUMIN 4.3 12/06/2015   CALCIUM 8.8 12/06/2015   GFR 100.67 12/06/2015   Lab Results  Component Value Date   CHOL 172 12/06/2015   Lab Results  Component Value Date   HDL 53.00 12/06/2015   Lab Results  Component Value Date   LDLCALC 104 (H) 12/06/2015   Lab Results  Component Value Date   TRIG 76.0 12/06/2015   Lab Results  Component Value Date   CHOLHDL 3 12/06/2015   Lab Results  Component Value Date   HGBA1C 6.5 12/06/2015       Assessment & Plan:   Problem List Items Addressed This Visit    Hyperlipidemia    Tolerating statin,  encouraged heart healthy diet, avoid trans fats, minimize simple carbs and saturated fats. Increase exercise as tolerated      Relevant Medications   lisinopril (PRINIVIL,ZESTRIL) 20 MG tablet   Other Relevant Orders   Lipid panel (Completed)   Essential hypertension    Well controlled, no changes to meds. Encouraged heart healthy diet such as the DASH diet and exercise as tolerated.       Relevant Medications   lisinopril (PRINIVIL,ZESTRIL) 20 MG tablet   Other Relevant Orders   TSH (Completed)   CBC (Completed)   Comprehensive metabolic panel (Completed)   Preventative health care    Patient encouraged to maintain heart healthy diet, regular exercise, adequate sleep. Consider daily probiotics. Take medications as prescribed. Given and reviewed copy of ACP documents from [employer] and encouraged to complete and return      Low libido    Testosterone normal on last check has been seen by urology they tried Cialis 5 mg daily x 4 days without improvement but will follow up with urology. viagra caused a headache      DM (diabetes mellitus), type 1 with ophthalmic complications (HCC)    hgba1c acceptable, minimize simple carbs. Increase exercise as tolerated. Continue current meds      Relevant Medications   insulin aspart (NOVOLOG) 100 UNIT/ML injection   lisinopril (PRINIVIL,ZESTRIL) 20 MG tablet   Other Relevant Orders   Hemoglobin A1c (Completed)   Sun-damaged skin    Lesion on right pinna this past year and sun damage. Will refer to dermatology for evaluation      Relevant Orders   Ambulatory referral to Dermatology   Anemia    Mild, new, asymptomatic. Increase leafy  greens, consider increased lean red meat and using cast iron cookware. Continue to monitor, report any concerns       Other Visit Diagnoses   None.     I have discontinued Mr. Jermaine Neuharth. I have also changed his insulin aspart. Additionally, I am having him start on cephALEXin. Lastly,  I am having him maintain his Vitamin D3, aspirin, tadalafil, simvastatin, insulin detemir, NOVOLOG, and lisinopril.  Meds ordered this encounter  Medications  . cephALEXin (KEFLEX) 500 MG capsule    Sig: Take 1 capsule (500 mg total) by mouth 3 (three) times daily.    Dispense:  30 capsule    Refill:  0  . insulin aspart (NOVOLOG) 100 UNIT/ML injection    Sig: Inject 7-20 Units into the skin 3 (three) times daily with meals. Changes doses seasonally    Dispense:  120 mL    Refill:  5  . lisinopril (PRINIVIL,ZESTRIL) 20 MG tablet    Sig: Take 1 tablet (20 mg total) by mouth 2 (two) times daily.    Dispense:  180 tablet    Refill:  1     Danise Edge, MD

## 2015-12-06 NOTE — Assessment & Plan Note (Signed)
hgba1c acceptable, minimize simple carbs. Increase exercise as tolerated. Continue current meds 

## 2015-12-22 ENCOUNTER — Encounter: Payer: Self-pay | Admitting: Family Medicine

## 2015-12-22 DIAGNOSIS — D649 Anemia, unspecified: Secondary | ICD-10-CM | POA: Insufficient documentation

## 2015-12-22 HISTORY — DX: Anemia, unspecified: D64.9

## 2015-12-22 NOTE — Assessment & Plan Note (Signed)
Mild, new, asymptomatic. Increase leafy greens, consider increased lean red meat and using cast iron cookware. Continue to monitor, report any concerns

## 2016-01-13 ENCOUNTER — Ambulatory Visit: Payer: Self-pay | Admitting: Family Medicine

## 2016-01-23 ENCOUNTER — Encounter: Payer: Self-pay | Admitting: Family Medicine

## 2016-01-23 ENCOUNTER — Ambulatory Visit (INDEPENDENT_AMBULATORY_CARE_PROVIDER_SITE_OTHER): Payer: BLUE CROSS/BLUE SHIELD | Admitting: Family Medicine

## 2016-01-23 VITALS — BP 115/70 | HR 60 | Temp 98.0°F | Wt 189.8 lb

## 2016-01-23 DIAGNOSIS — E103292 Type 1 diabetes mellitus with mild nonproliferative diabetic retinopathy without macular edema, left eye: Secondary | ICD-10-CM

## 2016-01-23 DIAGNOSIS — E782 Mixed hyperlipidemia: Secondary | ICD-10-CM

## 2016-01-23 DIAGNOSIS — Z23 Encounter for immunization: Secondary | ICD-10-CM

## 2016-01-23 DIAGNOSIS — M25561 Pain in right knee: Secondary | ICD-10-CM

## 2016-01-23 DIAGNOSIS — I10 Essential (primary) hypertension: Secondary | ICD-10-CM | POA: Diagnosis not present

## 2016-01-23 DIAGNOSIS — D649 Anemia, unspecified: Secondary | ICD-10-CM

## 2016-01-23 DIAGNOSIS — M25562 Pain in left knee: Secondary | ICD-10-CM

## 2016-01-23 LAB — COMPREHENSIVE METABOLIC PANEL
ALT: 19 U/L (ref 0–53)
AST: 28 U/L (ref 0–37)
Albumin: 4.5 g/dL (ref 3.5–5.2)
Alkaline Phosphatase: 63 U/L (ref 39–117)
BILIRUBIN TOTAL: 0.4 mg/dL (ref 0.2–1.2)
BUN: 20 mg/dL (ref 6–23)
CO2: 30 meq/L (ref 19–32)
Calcium: 9.7 mg/dL (ref 8.4–10.5)
Chloride: 103 mEq/L (ref 96–112)
Creatinine, Ser: 0.99 mg/dL (ref 0.40–1.50)
GFR: 83.24 mL/min (ref >60.00)
GLUCOSE: 123 mg/dL — AB (ref 70–99)
Potassium: 5.2 mEq/L — ABNORMAL HIGH (ref 3.5–5.1)
SODIUM: 138 meq/L (ref 135–145)
TOTAL PROTEIN: 7.3 g/dL (ref 6.0–8.3)

## 2016-01-23 LAB — CBC
HCT: 38.6 % — ABNORMAL LOW (ref 39.0–52.0)
Hemoglobin: 12.8 g/dL — ABNORMAL LOW (ref 13.0–17.0)
MCHC: 33.1 g/dL (ref 30.0–36.0)
MCV: 88.2 fl (ref 78.0–100.0)
Platelets: 195 10*3/uL (ref 150.0–400.0)
RBC: 4.37 Mil/uL (ref 4.22–5.81)
RDW: 13 % (ref 11.5–15.5)
WBC: 8.4 10*3/uL (ref 4.0–10.5)

## 2016-01-23 LAB — HEMOGLOBIN A1C: HEMOGLOBIN A1C: 6.8 % — AB (ref 4.6–6.5)

## 2016-01-23 LAB — LIPID PANEL
CHOL/HDL RATIO: 3
Cholesterol: 181 mg/dL (ref 0–200)
HDL: 61.6 mg/dL (ref >39.00)
LDL Cholesterol: 103 mg/dL — ABNORMAL HIGH (ref 0–99)
NONHDL: 119.18
Triglycerides: 82 mg/dL (ref 0.0–149.0)
VLDL: 16.4 mg/dL (ref 0.0–40.0)

## 2016-01-23 NOTE — Patient Instructions (Addendum)
MegaRed (regular) 1 cap daily by Schiff   Basic Carbohydrate Counting for Diabetes Mellitus Carbohydrate counting is a method for keeping track of the amount of carbohydrates you eat. Eating carbohydrates naturally increases the level of sugar (glucose) in your blood, so it is important for you to know the amount that is okay for you to have in every meal. Carbohydrate counting helps keep the level of glucose in your blood within normal limits. The amount of carbohydrates allowed is different for every person. A dietitian can help you calculate the amount that is right for you. Once you know the amount of carbohydrates you can have, you can count the carbohydrates in the foods you want to eat. Carbohydrates are found in the following foods:  Grains, such as breads and cereals.  Dried beans and soy products.  Starchy vegetables, such as potatoes, peas, and corn.  Fruit and fruit juices.  Milk and yogurt.  Sweets and snack foods, such as cake, cookies, candy, chips, soft drinks, and fruit drinks. CARBOHYDRATE COUNTING There are two ways to count the carbohydrates in your food. You can use either of the methods or a combination of both. Reading the "Nutrition Facts" on Packaged Food The "Nutrition Facts" is an area that is included on the labels of almost all packaged food and beverages in the [place]. It includes the serving size of that food or beverage and information about the nutrients in each serving of the food, including the grams (g) of carbohydrate per serving.  Decide the number of servings of this food or beverage that you will be able to eat or drink. Multiply that number of servings by the number of grams of carbohydrate that is listed on the label for that serving. The total will be the amount of carbohydrates you will be having when you eat or drink this food or beverage. Learning Standard Serving Sizes of Food When you eat food that is not packaged or does not include  "Nutrition Facts" on the label, you need to measure the servings in order to count the amount of carbohydrates.A serving of most carbohydrate-rich foods contains about 15 g of carbohydrates. The following list includes serving sizes of carbohydrate-rich foods that provide 15 g ofcarbohydrate per serving:   1 slice of bread (1 oz) or 1 six-inch tortilla.    of a hamburger bun or English muffin.  4-6 crackers.   cup unsweetened dry cereal.    cup hot cereal.   cup rice or pasta.    cup mashed potatoes or  of a large baked potato.  1 cup fresh fruit or one small piece of fruit.    cup canned or frozen fruit or fruit juice.  1 cup milk.   cup plain fat-free yogurt or yogurt sweetened with artificial sweeteners.   cup cooked dried beans or starchy vegetable, such as peas, corn, or potatoes.  Decide the number of standard-size servings that you will eat. Multiply that number of servings by 15 (the grams of carbohydrates in that serving). For example, if you eat 2 cups of strawberries, you will have eaten 2 servings and 30 g of carbohydrates (2 servings x 15 g = 30 g). For foods such as soups and casseroles, in which more than one food is mixed in, you will need to count the carbohydrates in each food that is included. EXAMPLE OF CARBOHYDRATE COUNTING Sample Dinner  3 oz chicken breast.   cup of brown rice.   cup of corn.  1 cup milk.   1 cup strawberries with sugar-free whipped topping.  Carbohydrate Calculation Step 1: Identify the foods that contain carbohydrates:   Rice.   Corn.   Milk.   Strawberries. Step 2:Calculate the number of servings eaten of each:   2 servings of rice.   1 serving of corn.   1 serving of milk.   1 serving of strawberries. Step 3: Multiply each of those number of servings by 15 g:   2 servings of rice x 15 g = 30 g.   1 serving of corn x 15 g = 15 g.   1 serving of milk x 15 g = 15 g.   1 serving of  strawberries x 15 g = 15 g. Step 4: Add together all of the amounts to find the total grams of carbohydrates eaten: 30 g + 15 g + 15 g + 15 g = 75 g.   This information is not intended to replace advice given to you by your health care provider. Make sure you discuss any questions you have with your health care provider.   Document Released: 03/23/2005 Document Revised: 04/13/2014 Document Reviewed: 02/17/2013 Elsevier Interactive Patient Education [employer].

## 2016-01-23 NOTE — Progress Notes (Signed)
Pre visit review using our clinic review tool, if applicable. No additional management support is needed unless otherwise documented below in the visit note. 

## 2016-01-23 NOTE — Progress Notes (Signed)
Patient ID: Scott Adkins, male   DOB: 01-28-1961, 55 y.o.   MRN: [id number]   Subjective:    Patient ID: Scott Adkins, male    DOB: Mar 23, 1961, 55 y.o.   MRN: [id number]  Chief Complaint  Patient presents with  . Follow-up    HPI Patient is in today for a follow up with bilateral knee, leg and feet pain, patient states its due to long walks at work. No other acute concerns or hospitalization. No falls or trauma. Denies CP/palp/SOB/HA/congestion/fevers/GI or GU c/o. Taking meds as prescribed  Past Medical History:  Diagnosis Date  . Anemia 12/22/2015  . Arthritis   . Chicken pox as a child  . Diabetes mellitus type I (HCC)    uses insulin pump// managed by Dr. Casimiro Needle Altheimer  . 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 Hyperlipidemia   . Hypertension   . Injury of right rotator cuff 08/16/2012   Has been seen by [facility]  . Left flank pain 09/12/2015  . Mumps as a child  . Preventative health care 02/19/2013  . Sun-damaged skin 12/06/2015    Past Surgical History:  Procedure Laterality Date  . insulin pump     placed and removed  . REFRACTIVE SURGERY  2007   [facility]  . ROTATOR CUFF REPAIR  2004   left    Family History  Problem Relation Age of Onset  . Hypertension    . Stroke    . Colon cancer Neg Hx   . Prostate cancer Neg Hx   . Breast cancer Neg Hx   . Rectal cancer Neg Hx   . Hypertension Mother   . Stroke Father   . Hypertension Father   . Hyperlipidemia Father   . Hypertension Brother   . Hyperlipidemia Brother   . Mental illness Brother     depression  . Heart disease Maternal Grandmother     MI  . Heart disease Maternal Grandfather     MI  . Hyperlipidemia Brother   . Hypertension Brother   . Diabetes Neg Hx     Social History   Social History  . Marital status: Married    Spouse name: N/A  . Number of children: N/A  . Years of education: N/A   Occupational History  . Not on file.   Social  History Main Topics  . Smoking status: Never Smoker  . Smokeless tobacco: Never Used  . Alcohol use 0.6 oz/week    1 Shots of liquor per week     Comment: Socially  . Drug use: No  . Sexual activity: Not on file     Comment: lives with wife and mother in law, no dietary restrictions   Other Topics Concern  . Not on file   Social History Narrative   Married 14 yrs   2 daughters; 1 son   Occupation: Time Physiological scientist          Outpatient Medications Prior to Visit  Medication Sig Dispense Refill  . aspirin 81 MG tablet Take 81 mg by mouth daily.    . cephALEXin (KEFLEX) 500 MG capsule Take 1 capsule (500 mg total) by mouth 3 (three) times daily. 30 capsule 0  . Cholecalciferol (VITAMIN D3) 1000 UNITS CAPS Take 2,000 Units by mouth daily.    . insulin aspart (NOVOLOG) 100 UNIT/ML injection Inject 7-20 Units into the skin 3 (three) times daily with meals. Changes doses seasonally 120 mL 5  .  insulin detemir (LEVEMIR) 100 UNIT/ML injection Inject 0.1 mLs (10 Units total) into the skin at bedtime. 10 mL 5  . lisinopril (PRINIVIL,ZESTRIL) 20 MG tablet Take 1 tablet (20 mg total) by mouth 2 (two) times daily. 180 tablet 1  . NOVOLOG 100 UNIT/ML injection INJECT 10 TO 15 UNITS SUBCUTANEOUSLY THREE TIMES DAILY WITH MEALS 20 mL 5  . simvastatin (ZOCOR) 40 MG tablet TAKE ONE TABLET BY MOUTH ONCE DAILY 90 tablet 2  . tadalafil (CIALIS) 5 MG tablet Take 5 mg by mouth. Reported on 07/25/2015     No facility-administered medications prior to visit.     Allergies  Allergen Reactions  . Viagra [Sildenafil Citrate] Other (See Comments)    headache    Review of Systems  Constitutional: Negative for fever.  Eyes: Negative for blurred vision.  Respiratory: Negative for cough and shortness of breath.   Cardiovascular: Negative for chest pain and palpitations.  Gastrointestinal: Negative for vomiting.  Musculoskeletal: Positive for joint pain and myalgias. Negative for back pain.  Skin:  Negative for rash.  Neurological: Negative for loss of consciousness and headaches.       Objective:    Physical Exam  Constitutional: He is oriented to person, place, and time. He appears well-developed and well-nourished. No distress.  HENT:  Head: Normocephalic and atraumatic.  Eyes: Conjunctivae are normal.  Neck: Normal range of motion. No thyromegaly present.  Cardiovascular: Normal rate and regular rhythm.   Pulmonary/Chest: Effort normal and breath sounds normal. He has no wheezes.  Abdominal: Soft. Bowel sounds are normal. There is no tenderness.  Musculoskeletal: Normal range of motion. He exhibits no edema or deformity.  Neurological: He is alert and oriented to person, place, and time.  Skin: Skin is warm and dry. He is not diaphoretic.  Psychiatric: He has a normal mood and affect.    BP 115/70 (BP Location: Left Arm, Patient Position: Sitting, Cuff Size: Normal)   Pulse 60   Temp 98 F (36.7 C) (Oral)   Wt 189 lb 12.8 oz (86.1 kg)   SpO2 99%   BMI 29.29 kg/m  Wt Readings from Last 3 Encounters:  01/23/16 189 lb 12.8 oz (86.1 kg)  12/06/15 187 lb 6.4 oz (85 kg)  09/12/15 188 lb (85.3 kg)     Lab Results  Component Value Date   WBC 7.8 12/06/2015   HGB 12.9 (L) 12/06/2015   HCT 39.0 12/06/2015   PLT 203.0 12/06/2015   GLUCOSE 146 (H) 12/06/2015   CHOL 172 12/06/2015   TRIG 76.0 12/06/2015   HDL 53.00 12/06/2015   LDLDIRECT 71 09/10/2008   LDLCALC 104 (H) 12/06/2015   ALT 14 12/06/2015   AST 17 12/06/2015   NA 137 12/06/2015   K 4.5 12/06/2015   CL 102 12/06/2015   CREATININE 0.84 12/06/2015   BUN 19 12/06/2015   CO2 29 12/06/2015   TSH 1.12 12/06/2015   PSA 0.61 02/15/2014   HGBA1C 6.5 12/06/2015   MICROALBUR <0.7 02/15/2015    Lab Results  Component Value Date   TSH 1.12 12/06/2015   Lab Results  Component Value Date   WBC 7.8 12/06/2015   HGB 12.9 (L) 12/06/2015   HCT 39.0 12/06/2015   MCV 88.3 12/06/2015   PLT 203.0 12/06/2015    Lab Results  Component Value Date   NA 137 12/06/2015   K 4.5 12/06/2015   CO2 29 12/06/2015   GLUCOSE 146 (H) 12/06/2015   BUN 19 12/06/2015   CREATININE 0.84  12/06/2015   BILITOT 0.6 12/06/2015   ALKPHOS 72 12/06/2015   AST 17 12/06/2015   ALT 14 12/06/2015   PROT 7.1 12/06/2015   ALBUMIN 4.3 12/06/2015   CALCIUM 8.8 12/06/2015   GFR 100.67 12/06/2015   Lab Results  Component Value Date   CHOL 172 12/06/2015   Lab Results  Component Value Date   HDL 53.00 12/06/2015   Lab Results  Component Value Date   LDLCALC 104 (H) 12/06/2015   Lab Results  Component Value Date   TRIG 76.0 12/06/2015   Lab Results  Component Value Date   CHOLHDL 3 12/06/2015   Lab Results  Component Value Date   HGBA1C 6.5 12/06/2015       Assessment & Plan:   Problem List Items Addressed This Visit    None    Visit Diagnoses   None.     I am having Mr. Yetta BarreJones maintain his Vitamin D3, aspirin, tadalafil, simvastatin, insulin detemir, NOVOLOG, cephALEXin, insulin aspart, and lisinopril.  No orders of the defined types were placed in this encounter.    Crissie SicklesPrincess Monifah Freehling, [place]MA

## 2016-01-24 ENCOUNTER — Encounter: Payer: Self-pay | Admitting: Family Medicine

## 2016-01-24 ENCOUNTER — Other Ambulatory Visit: Payer: Self-pay

## 2016-01-24 DIAGNOSIS — M25569 Pain in unspecified knee: Secondary | ICD-10-CM

## 2016-01-24 DIAGNOSIS — E875 Hyperkalemia: Secondary | ICD-10-CM

## 2016-01-24 HISTORY — DX: Pain in unspecified knee: M25.569

## 2016-01-24 NOTE — Assessment & Plan Note (Signed)
Well controlled, no changes to meds. Encouraged heart healthy diet such as the DASH diet and exercise as tolerated.  °

## 2016-01-24 NOTE — Assessment & Plan Note (Signed)
Encouraged heart healthy diet, increase exercise, avoid trans fats, consider a krill oil cap daily 

## 2016-01-24 NOTE — Assessment & Plan Note (Signed)
Increase leafy greens, consider increased lean red meat and using cast iron cookware. Continue to monitor, report any concerns 

## 2016-01-24 NOTE — Assessment & Plan Note (Signed)
hgba1c acceptable, minimize simple carbs. Increase exercise as tolerated. Continue current meds 

## 2016-01-24 NOTE — Assessment & Plan Note (Signed)
Achy legs most notable after long shift at work. Encouraged topical treatments, adequate hydration na dis given Celebrex to use prn

## 2016-02-11 ENCOUNTER — Other Ambulatory Visit (INDEPENDENT_AMBULATORY_CARE_PROVIDER_SITE_OTHER): Payer: BLUE CROSS/BLUE SHIELD

## 2016-02-11 DIAGNOSIS — E875 Hyperkalemia: Secondary | ICD-10-CM | POA: Diagnosis not present

## 2016-02-12 LAB — BASIC METABOLIC PANEL
BUN: 25 mg/dL — ABNORMAL HIGH (ref 6–23)
CALCIUM: 9.7 mg/dL (ref 8.4–10.5)
CHLORIDE: 106 meq/L (ref 96–112)
CO2: 30 meq/L (ref 19–32)
CREATININE: 1.14 mg/dL (ref 0.40–1.50)
GFR: 70.72 mL/min (ref >60.00)
GLUCOSE: 93 mg/dL (ref 70–99)
Potassium: 5.1 mEq/L (ref 3.5–5.1)
SODIUM: 141 meq/L (ref 135–145)

## 2016-04-19 ENCOUNTER — Other Ambulatory Visit: Payer: Self-pay | Admitting: Family Medicine

## 2016-04-27 ENCOUNTER — Ambulatory Visit (INDEPENDENT_AMBULATORY_CARE_PROVIDER_SITE_OTHER): Payer: BLUE CROSS/BLUE SHIELD | Admitting: Family Medicine

## 2016-04-27 ENCOUNTER — Encounter: Payer: Self-pay | Admitting: Family Medicine

## 2016-04-27 DIAGNOSIS — E103292 Type 1 diabetes mellitus with mild nonproliferative diabetic retinopathy without macular edema, left eye: Secondary | ICD-10-CM | POA: Diagnosis not present

## 2016-04-27 DIAGNOSIS — E782 Mixed hyperlipidemia: Secondary | ICD-10-CM | POA: Diagnosis not present

## 2016-04-27 DIAGNOSIS — D649 Anemia, unspecified: Secondary | ICD-10-CM | POA: Diagnosis not present

## 2016-04-27 DIAGNOSIS — I10 Essential (primary) hypertension: Secondary | ICD-10-CM | POA: Diagnosis not present

## 2016-04-27 NOTE — Assessment & Plan Note (Signed)
Well controlled, no changes to meds. Encouraged heart healthy diet such as the DASH diet and exercise as tolerated.  °

## 2016-04-27 NOTE — Progress Notes (Signed)
Pre visit review using our clinic review tool, if applicable. No additional management support is needed unless otherwise documented below in the visit note. 

## 2016-04-27 NOTE — Assessment & Plan Note (Addendum)
hgba1c acceptable, minimize simple carbs. Increase exercise as tolerated. Continue current meds. Did have a 283 sugar this am but acknowledges he ate excessive carbs last night. Generally am sugars 60 to 130

## 2016-04-27 NOTE — Patient Instructions (Signed)
Carbohydrate Counting for Diabetes Mellitus, Adult Carbohydrate counting is a method for keeping track of how many carbohydrates you eat. Eating carbohydrates naturally increases the amount of sugar (glucose) in the blood. Counting how many carbohydrates you eat helps keep your blood glucose within normal limits, which helps you manage your diabetes (diabetes mellitus). It is important to know how many carbohydrates you can safely have in each meal. This is different for every person. A diet and nutrition specialist (registered dietitian) can help you make a meal plan and calculate how many carbohydrates you should have at each meal and snack. Carbohydrates are found in the following foods:  Grains, such as breads and cereals.  Dried beans and soy products.  Starchy vegetables, such as potatoes, peas, and corn.  Fruit and fruit juices.  Milk and yogurt.  Sweets and snack foods, such as cake, cookies, candy, chips, and soft drinks. How do I count carbohydrates? There are two ways to count carbohydrates in food. You can use either of the methods or a combination of both. Reading "Nutrition Facts" on packaged food  The "Nutrition Facts" list is included on the labels of almost all packaged foods and beverages in the [place] It includes:  The serving size.  Information about nutrients in each serving, including the grams (g) of carbohydrate per serving. To use the "Nutrition Facts":  Decide how many servings you will have.  Multiply the number of servings by the number of carbohydrates per serving.  The resulting number is the total amount of carbohydrates that you will be having. Learning standard serving sizes of other foods  When you eat foods containing carbohydrates that are not packaged or do not include "Nutrition Facts" on the label, you need to measure the servings in order to count the amount of carbohydrates:  Measure the foods that you will eat with a food scale or measuring  cup, if needed.  Decide how many standard-size servings you will eat.  Multiply the number of servings by 15. Most carbohydrate-rich foods have about 15 g of carbohydrates per serving.  For example, if you eat 8 oz (170 g) of strawberries, you will have eaten 2 servings and 30 g of carbohydrates (2 servings x 15 g = 30 g).  For foods that have more than one food mixed, such as soups and casseroles, you must count the carbohydrates in each food that is included. The following list contains standard serving sizes of common carbohydrate-rich foods. Each of these servings has about 15 g of carbohydrates:   hamburger bun or  English muffin.   oz (15 mL) syrup.   oz (14 g) jelly.  1 slice of bread.  1 six-inch tortilla.  3 oz (85 g) cooked rice or pasta.  4 oz (113 g) cooked dried beans.  4 oz (113 g) starchy vegetable, such as peas, corn, or potatoes.  4 oz (113 g) hot cereal.  4 oz (113 g) mashed potatoes or  of a large baked potato.  4 oz (113 g) canned or frozen fruit.  4 oz (120 mL) fruit juice.  4-6 crackers.  6 chicken nuggets.  6 oz (170 g) unsweetened dry cereal.  6 oz (170 g) plain fat-free yogurt or yogurt sweetened with artificial sweeteners.  8 oz (240 mL) milk.  8 oz (170 g) fresh fruit or one small piece of fruit.  24 oz (680 g) popped popcorn. Example of carbohydrate counting Sample meal  3 oz (85 g) chicken breast.  6 oz (  170 g) brown rice.  4 oz (113 g) corn.  8 oz (240 mL) milk.  8 oz (170 g) strawberries with sugar-free whipped topping. Carbohydrate calculation 1. Identify the foods that contain carbohydrates:  Rice.  Corn.  Milk.  Strawberries. 2. Calculate how many servings you have of each food:  2 servings rice.  1 serving corn.  1 serving milk.  1 serving strawberries. 3. Multiply each number of servings by 15 g:  2 servings rice x 15 g = 30 g.  1 serving corn x 15 g = 15 g.  1 serving milk x 15 g = 15  g.  1 serving strawberries x 15 g = 15 g. 4. Add together all of the amounts to find the total grams of carbohydrates eaten:  30 g + 15 g + 15 g + 15 g = 75 g of carbohydrates total. This information is not intended to replace advice given to you by your health care provider. Make sure you discuss any questions you have with your health care provider. Document Released: 03/23/2005 Document Revised: 10/11/2015 Document Reviewed: 09/04/2015 Elsevier Interactive Patient Education  2017 Elsevier Inc.  

## 2016-04-27 NOTE — Assessment & Plan Note (Signed)
Increase leafy greens, consider increased lean red meat and using cast iron cookware. Continue to monitor, report any concerns 

## 2016-04-27 NOTE — Assessment & Plan Note (Signed)
Tolerating statin, encouraged heart healthy diet, avoid trans fats, minimize simple carbs and saturated fats. Increase exercise as tolerated 

## 2016-04-28 ENCOUNTER — Telehealth: Payer: Self-pay | Admitting: *Deleted

## 2016-04-28 LAB — COMPREHENSIVE METABOLIC PANEL
ALT: 17 U/L (ref 0–53)
AST: 21 U/L (ref 0–37)
Albumin: 4.3 g/dL (ref 3.5–5.2)
Alkaline Phosphatase: 54 U/L (ref 39–117)
BILIRUBIN TOTAL: 0.4 mg/dL (ref 0.2–1.2)
BUN: 23 mg/dL (ref 6–23)
CHLORIDE: 105 meq/L (ref 96–112)
CO2: 33 meq/L — AB (ref 19–32)
CREATININE: 0.97 mg/dL (ref 0.40–1.50)
Calcium: 9.8 mg/dL (ref 8.4–10.5)
GFR: 85.15 mL/min (ref >60.00)
GLUCOSE: 49 mg/dL — AB (ref 70–99)
Potassium: 5 mEq/L (ref 3.5–5.1)
SODIUM: 142 meq/L (ref 135–145)
Total Protein: 7 g/dL (ref 6.0–8.3)

## 2016-04-28 LAB — LIPID PANEL
CHOLESTEROL: 162 mg/dL (ref 0–200)
HDL: 60.6 mg/dL (ref >39.00)
LDL CALC: 84 mg/dL (ref 0–99)
NonHDL: 101.66
Total CHOL/HDL Ratio: 3
Triglycerides: 87 mg/dL (ref 0.0–149.0)
VLDL: 17.4 mg/dL (ref 0.0–40.0)

## 2016-04-28 LAB — CBC
HEMATOCRIT: 37.8 % — AB (ref 39.0–52.0)
Hemoglobin: 12.5 g/dL — ABNORMAL LOW (ref 13.0–17.0)
MCHC: 33.1 g/dL (ref 30.0–36.0)
MCV: 86.8 fl (ref 78.0–100.0)
Platelets: 196 10*3/uL (ref 150.0–400.0)
RBC: 4.36 Mil/uL (ref 4.22–5.81)
RDW: 13 % (ref 11.5–15.5)
WBC: 8.3 10*3/uL (ref 4.0–10.5)

## 2016-04-28 LAB — HEMOGLOBIN A1C: HEMOGLOBIN A1C: 6.8 % — AB (ref 4.6–6.5)

## 2016-04-28 LAB — TSH: TSH: 2.42 u[IU]/mL (ref 0.35–4.50)

## 2016-04-28 NOTE — Telephone Encounter (Signed)
[facility].  Glucose:49.  Dr. Abner GreenspanBlyth was given the critical lab results verbally.//AB/CMA

## 2016-04-29 NOTE — Telephone Encounter (Signed)
Patient asymptomatic in office was getting something to eat as he left

## 2016-04-29 NOTE — Progress Notes (Signed)
Patient ID: Scott Adkins, male   DOB: 05/29/1960, 56 y.o.   MRN: [id number]   Subjective:    Patient ID: Scott Adkins, male    DOB: 1960/06/29, 56 y.o.   MRN: [id number]  Chief Complaint  Patient presents with  . Follow-up    Anemia. 30-month F/U.    HPI Patient is in today for follow up on anemia, diabetes, htn, hyperlipidemia. He feels well. No recent acute febrile illness or hospitalizations. He has been trying to eat well although he notes that he ate too many carbs last night and his sugar was high this am at 283. He denies polyuria or polydipsia. Denies CP/palp/SOB/HA/congestion/fevers/GI or GU c/o. Taking meds as prescribed  Past Medical History:  Diagnosis Date  . Anemia 12/22/2015  . Arthritis   . Chicken pox as a child  . Diabetes mellitus type I (HCC)    uses insulin pump// managed by Dr. Casimiro Needle Altheimer  . 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 Hyperlipidemia   . Hypertension   . Injury of right rotator cuff 08/16/2012   Has been seen by [facility]  . Left flank pain 09/12/2015  . Mumps as a child  . Pain in joint, lower leg 01/24/2016  . Preventative health care 02/19/2013  . Sun-damaged skin 12/06/2015    Past Surgical History:  Procedure Laterality Date  . insulin pump     placed and removed  . REFRACTIVE SURGERY  2007   [facility]  . ROTATOR CUFF REPAIR  2004   left    Family History  Problem Relation Age of Onset  . Hypertension Mother   . Stroke Father   . Hypertension Father   . Hyperlipidemia Father   . Hypertension Brother   . Hyperlipidemia Brother   . Mental illness Brother     depression  . Heart disease Maternal Grandmother     MI  . Heart disease Maternal Grandfather     MI  . Hyperlipidemia Brother   . Hypertension Brother   . Hypertension    . Stroke    . Colon cancer Neg Hx   . Prostate cancer Neg Hx   . Breast cancer Neg Hx   . Rectal cancer Neg Hx   . Diabetes Neg Hx     Social History    Social History  . Marital status: Married    Spouse name: N/A  . Number of children: N/A  . Years of education: N/A   Occupational History  . Not on file.   Social History Main Topics  . Smoking status: Never Smoker  . Smokeless tobacco: Never Used  . Alcohol use 0.6 oz/week    1 Shots of liquor per week     Comment: Socially  . Drug use: No  . Sexual activity: Not on file     Comment: lives with wife and mother in law, no dietary restrictions   Other Topics Concern  . Not on file   Social History Narrative   Married 14 yrs   2 daughters; 1 son   Occupation: Time Physiological scientist          Outpatient Medications Prior to Visit  Medication Sig Dispense Refill  . aspirin 81 MG tablet Take 81 mg by mouth daily.    . Cholecalciferol (VITAMIN D3) 1000 UNITS CAPS Take 2,000 Units by mouth daily.    . insulin detemir (LEVEMIR) 100 UNIT/ML injection Inject 0.1 mLs (10 Units total) into  the skin at bedtime. 10 mL 5  . lisinopril (PRINIVIL,ZESTRIL) 20 MG tablet Take 1 tablet (20 mg total) by mouth 2 (two) times daily. 180 tablet 1  . NOVOLOG 100 UNIT/ML injection INJECT 10 TO 15 UNITS SUBCUTANEOUSLY THREE TIMES DAILY WITH MEALS 20 mL 5  . NOVOLOG 100 UNIT/ML injection INJECT 7-20 UNITS INTO THE SKIN THREE TIMES DAILY WITH MEALS. CHANGES DOSES SEASONALLY 40 mL 4  . simvastatin (ZOCOR) 40 MG tablet TAKE ONE TABLET BY MOUTH ONCE DAILY 90 tablet 2  . cephALEXin (KEFLEX) 500 MG capsule Take 1 capsule (500 mg total) by mouth 3 (three) times daily. (Patient not taking: Reported on 04/27/2016) 30 capsule 0   No facility-administered medications prior to visit.     Allergies  Allergen Reactions  . Viagra [Sildenafil Citrate] Other (See Comments)    headache    Review of Systems  Constitutional: Negative for fever and malaise/fatigue.  HENT: Negative for congestion.   Eyes: Negative for blurred vision.  Respiratory: Negative for sputum production and shortness of breath.     Cardiovascular: Negative for chest pain, palpitations and leg swelling.  Gastrointestinal: Negative for abdominal pain, blood in stool and nausea.  Genitourinary: Negative for dysuria and frequency.  Musculoskeletal: Negative for falls.  Skin: Negative for rash.  Neurological: Negative for dizziness, loss of consciousness and headaches.  Endo/Heme/Allergies: Negative for environmental allergies.  Psychiatric/Behavioral: Negative for depression. The patient is not nervous/anxious.        Objective:    Physical Exam  Constitutional: He is oriented to person, place, and time. He appears well-developed and well-nourished. No distress.  HENT:  Head: Normocephalic and atraumatic.  Nose: Nose normal.  Eyes: Right eye exhibits no discharge. Left eye exhibits no discharge.  Neck: Normal range of motion. Neck supple.  Cardiovascular: Normal rate and regular rhythm.   No murmur heard. Pulmonary/Chest: Effort normal and breath sounds normal.  Abdominal: Soft. Bowel sounds are normal. He exhibits no mass. There is no tenderness.  Musculoskeletal: He exhibits no edema.  Neurological: He is alert and oriented to person, place, and time.  Skin: Skin is warm and dry.  Psychiatric: He has a normal mood and affect.  Nursing note and vitals reviewed.   BP 118/70 (BP Location: Left Arm, Patient Position: Sitting, Cuff Size: Normal)   Pulse 60   Temp 97.7 F (36.5 C) (Oral)   Wt 186 lb 3.2 oz (84.5 kg)   SpO2 97% Comment: RA  BMI 28.73 kg/m  Wt Readings from Last 3 Encounters:  04/27/16 186 lb 3.2 oz (84.5 kg)  01/23/16 189 lb 12.8 oz (86.1 kg)  12/06/15 187 lb 6.4 oz (85 kg)     Lab Results  Component Value Date   WBC 8.3 04/27/2016   HGB 12.5 (L) 04/27/2016   HCT 37.8 (L) 04/27/2016   PLT 196.0 04/27/2016   GLUCOSE 49 (LL) 04/27/2016   CHOL 162 04/27/2016   TRIG 87.0 04/27/2016   HDL 60.60 04/27/2016   LDLDIRECT 71 09/10/2008   LDLCALC 84 04/27/2016   ALT 17 04/27/2016   AST  21 04/27/2016   NA 142 04/27/2016   K 5.0 04/27/2016   CL 105 04/27/2016   CREATININE 0.97 04/27/2016   BUN 23 04/27/2016   CO2 33 (H) 04/27/2016   TSH 2.42 04/27/2016   PSA 0.61 02/15/2014   HGBA1C 6.8 (H) 04/27/2016   MICROALBUR <0.7 02/15/2015    Lab Results  Component Value Date   TSH 2.42 04/27/2016  Lab Results  Component Value Date   WBC 8.3 04/27/2016   HGB 12.5 (L) 04/27/2016   HCT 37.8 (L) 04/27/2016   MCV 86.8 04/27/2016   PLT 196.0 04/27/2016   Lab Results  Component Value Date   NA 142 04/27/2016   K 5.0 04/27/2016   CO2 33 (H) 04/27/2016   GLUCOSE 49 (LL) 04/27/2016   BUN 23 04/27/2016   CREATININE 0.97 04/27/2016   BILITOT 0.4 04/27/2016   ALKPHOS 54 04/27/2016   AST 21 04/27/2016   ALT 17 04/27/2016   PROT 7.0 04/27/2016   ALBUMIN 4.3 04/27/2016   CALCIUM 9.8 04/27/2016   GFR 85.15 04/27/2016   Lab Results  Component Value Date   CHOL 162 04/27/2016   Lab Results  Component Value Date   HDL 60.60 04/27/2016   Lab Results  Component Value Date   LDLCALC 84 04/27/2016   Lab Results  Component Value Date   TRIG 87.0 04/27/2016   Lab Results  Component Value Date   CHOLHDL 3 04/27/2016   Lab Results  Component Value Date   HGBA1C 6.8 (H) 04/27/2016       Assessment & Plan:   Problem List Items Addressed This Visit    Hyperlipidemia    Tolerating statin, encouraged heart healthy diet, avoid trans fats, minimize simple carbs and saturated fats. Increase exercise as tolerated      Relevant Orders   Lipid panel (Completed)   Lipid panel   Essential hypertension    Well controlled, no changes to meds. Encouraged heart healthy diet such as the DASH diet and exercise as tolerated.       Relevant Orders   Comprehensive metabolic panel (Completed)   CBC (Completed)   TSH (Completed)   CBC   Comprehensive metabolic panel   TSH   DM (diabetes mellitus), type 1 with ophthalmic complications (HCC)    hgba1c acceptable, minimize  simple carbs. Increase exercise as tolerated. Continue current meds. Did have a 283 sugar this am but acknowledges he ate excessive carbs last night. Generally am sugars 60 to 130      Relevant Orders   Hemoglobin A1c (Completed)   Hemoglobin A1c   Anemia    Increase leafy greens, consider increased lean red meat and using cast iron cookware. Continue to monitor, report any concerns      Relevant Orders   Comprehensive metabolic panel (Completed)   CBC (Completed)   TSH (Completed)   CBC   Comprehensive metabolic panel   TSH      I have discontinued Mr. Rodriquez cephALEXin. I am also having him maintain his Vitamin D3, aspirin, simvastatin, insulin detemir, NOVOLOG, lisinopril, and NOVOLOG.  No orders of the defined types were placed in this encounter.    Danise Edge, MD

## 2016-05-04 ENCOUNTER — Other Ambulatory Visit: Payer: Self-pay | Admitting: Family Medicine

## 2016-05-06 ENCOUNTER — Telehealth: Payer: Self-pay | Admitting: Family Medicine

## 2016-05-06 MED ORDER — SIMVASTATIN 40 MG PO TABS: 40.0000 mg | ORAL_TABLET | Freq: Every day | ORAL | 3 refills | Status: DC

## 2016-05-06 NOTE — Telephone Encounter (Signed)
Patient is requesting a refill of simvastatin (ZOCOR) 40 MG tablet  Please advise  Pharmacy: [id number] - [place], [place] - [address]

## 2016-06-03 ENCOUNTER — Ambulatory Visit (INDEPENDENT_AMBULATORY_CARE_PROVIDER_SITE_OTHER): Payer: BLUE CROSS/BLUE SHIELD | Admitting: Family Medicine

## 2016-06-03 ENCOUNTER — Encounter: Payer: Self-pay | Admitting: Gastroenterology

## 2016-06-03 ENCOUNTER — Encounter: Payer: Self-pay | Admitting: Family Medicine

## 2016-06-03 VITALS — BP 122/65 | HR 68 | Ht 68.0 in | Wt 184.8 lb

## 2016-06-03 DIAGNOSIS — K625 Hemorrhage of anus and rectum: Secondary | ICD-10-CM | POA: Diagnosis not present

## 2016-06-03 DIAGNOSIS — D649 Anemia, unspecified: Secondary | ICD-10-CM

## 2016-06-03 LAB — CBC
HCT: 39.7 % (ref 39.0–52.0)
HEMOGLOBIN: 13 g/dL (ref 13.0–17.0)
MCHC: 32.8 g/dL (ref 30.0–36.0)
MCV: 88.6 fl (ref 78.0–100.0)
PLATELETS: 197 10*3/uL (ref 150.0–400.0)
RBC: 4.48 Mil/uL (ref 4.22–5.81)
RDW: 12.9 % (ref 11.5–15.5)
WBC: 9.1 10*3/uL (ref 4.0–10.5)

## 2016-06-03 LAB — IBC PANEL
Iron: 79 ug/dL (ref 42–165)
Saturation Ratios: 22.6 % (ref 20.0–50.0)
TRANSFERRIN: 250 mg/dL (ref 212.0–360.0)

## 2016-06-03 LAB — FERRITIN: FERRITIN: 116 ng/mL (ref 22.0–322.0)

## 2016-06-03 NOTE — Patient Instructions (Signed)
If you start having fevers, pain, mucous in stool, or other new symptoms, let us know.  If you don't hear anything about your referral in the next 1-2 weeks, call our office and ask for an update.

## 2016-06-03 NOTE — Progress Notes (Signed)
Pre visit review using our clinic review tool, if applicable. No additional management support is needed unless otherwise documented below in the visit note. 

## 2016-06-03 NOTE — Progress Notes (Signed)
Chief Complaint  Patient presents with  . Rectal Bleeding    pt. reports occasional blood in stool for the past month    Subjective: Patient is a 56 y.o. male here for blood in stool.  This has been going on for since Dec, worsening over the past 1 mo. He will notice a small amount of bright red blood in his stool and on the toilet tissue intermittently (around every 3-4 BM's). He denies any abd pain, rectal pain, constipation, fevers, injury to area, mucus in stool. He had a nml colonoscopy at age 53 and 10 yr f/u was recommended. His most recent labs did show a normocytic anemia in Jan.  ROS: GI: As noted in HPI Const: No fevers  Family History  Problem Relation Age of Onset  . Hypertension Mother   . Stroke Father   . Hypertension Father   . Hyperlipidemia Father   . Hypertension Brother   . Hyperlipidemia Brother   . Mental illness Brother     depression  . Heart disease Maternal Grandmother     MI  . Heart disease Maternal Grandfather     MI  . Hyperlipidemia Brother   . Hypertension Brother   . Hypertension    . Stroke    . Colon cancer Neg Hx   . Prostate cancer Neg Hx   . Breast cancer Neg Hx   . Rectal cancer Neg Hx   . Diabetes Neg Hx    Past Medical History:  Diagnosis Date  . Anemia 12/22/2015  . Arthritis   . Chicken pox as a child  . Diabetes mellitus type I (HCC)    uses insulin pump// managed by Dr. Casimiro Needle Altheimer  . 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 Hyperlipidemia   . Hypertension   . Injury of right rotator cuff 08/16/2012   Has been seen by [facility]  . Left flank pain 09/12/2015  . Mumps as a child  . Pain in joint, lower leg 01/24/2016  . Preventative health care 02/19/2013  . Sun-damaged skin 12/06/2015   Allergies  Allergen Reactions  . Viagra [Sildenafil Citrate] Other (See Comments)    headache    Current Outpatient Prescriptions:  .  aspirin 81 MG tablet, Take 81 mg by mouth daily.,  Disp: , Rfl:  .  Cholecalciferol (VITAMIN D3) 1000 UNITS CAPS, Take 2,000 Units by mouth daily., Disp: , Rfl:  .  insulin detemir (LEVEMIR) 100 UNIT/ML injection, Inject 0.1 mLs (10 Units total) into the skin at bedtime., Disp: 10 mL, Rfl: 5 .  lisinopril (PRINIVIL,ZESTRIL) 20 MG tablet, Take 1 tablet (20 mg total) by mouth 2 (two) times daily., Disp: 180 tablet, Rfl: 1 .  NOVOLOG 100 UNIT/ML injection, INJECT 10 TO 15 UNITS SUBCUTANEOUSLY THREE TIMES DAILY WITH MEALS, Disp: 20 mL, Rfl: 5 .  NOVOLOG 100 UNIT/ML injection, INJECT 7-20 UNITS INTO THE SKIN THREE TIMES DAILY WITH MEALS. CHANGES DOSES SEASONALLY, Disp: 40 mL, Rfl: 4 .  simvastatin (ZOCOR) 40 MG tablet, Take 1 tablet (40 mg total) by mouth daily., Disp: 90 tablet, Rfl: 3  Objective: BP 122/65 (BP Location: Left Arm, Cuff Size: Normal)   Pulse 68   Ht 5\' 8"  (1.727 m)   Wt 184 lb 12.8 oz (83.8 kg)   SpO2 100%   BMI 28.10 kg/m  General: Awake, appears stated age HEENT: MMM, no subglossal icterus, no conjunctival icterus.  Heart: RRR, no murmurs Lungs: CTAB, no rales, wheezes or  rhonchi. No accessory muscle use Abd: BS+, soft, NT, ND, no masses or organomegaly Rectum: No external fissures, hemorrhoids or lesions. Sphincter of good tone. No masses or hemorrhoids palpated on DRE.  Psych: Age appropriate judgment and insight, normal affect and mood  Assessment and Plan: BRBPR (bright red blood per rectum) - Plan: Ambulatory referral to Gastroenterology, CBC  Normocytic anemia - Plan: CBC, IBC panel, Ferritin  Orders as above. Will refer to GI to make sure nothing else is going on.  His Hb has steadily been decreasing, will check Fe. F/u prn. The patient voiced understanding and agreement to the plan.  Jilda Rocheicholas Paul [place], DO 06/03/16  2:28 PM

## 2016-07-06 ENCOUNTER — Ambulatory Visit (INDEPENDENT_AMBULATORY_CARE_PROVIDER_SITE_OTHER): Payer: BLUE CROSS/BLUE SHIELD | Admitting: Family Medicine

## 2016-07-06 ENCOUNTER — Encounter: Payer: Self-pay | Admitting: Family Medicine

## 2016-07-06 VITALS — BP 130/60 | HR 79 | Temp 98.1°F | Ht 68.0 in | Wt 180.0 lb

## 2016-07-06 DIAGNOSIS — J301 Allergic rhinitis due to pollen: Secondary | ICD-10-CM | POA: Diagnosis not present

## 2016-07-06 MED ORDER — FLUTICASONE PROPIONATE 50 MCG/ACT NA SUSP: 2.0000 | Freq: Every day | NASAL | 2 refills | Status: DC

## 2016-07-06 NOTE — Progress Notes (Addendum)
Chief Complaint  Patient presents with  . Sinus Problem    congestion and pressure-(R) side-since last Thurs    Scott Adkins here for URI complaints.  Duration: 4 days  Associated symptoms: sinus congestion, sinus pain, rhinorrhea, sore throat and cough Denies: itchy watery eyes, ear pain, ear drainage, shortness of breath, myalgia and rigors Treatment to date: Vick's, Tylenol cold and sinus, [facility] brand cold and sinus  Sick contacts: No  It does not hurt when he chews.  ROS:  Const: Denies fevers HEENT: As noted in HPI Lungs: No SOB  Past Medical History:  Diagnosis Date  . Anemia 12/22/2015  . Arthritis   . Chicken pox as a child  . Diabetes mellitus type I (HCC)    uses insulin pump// managed by Dr. Casimiro Needle Altheimer  . 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 Hyperlipidemia   . Hypertension   . Injury of right rotator cuff 08/16/2012   Has been seen by [facility]  . Left flank pain 09/12/2015  . Mumps as a child  . Pain in joint, lower leg 01/24/2016  . Preventative health care 02/19/2013  . Sun-damaged skin 12/06/2015   Family History  Problem Relation Age of Onset  . Hypertension Mother   . Stroke Father   . Hypertension Father   . Hyperlipidemia Father   . Hypertension Brother   . Hyperlipidemia Brother   . Mental illness Brother     depression  . Heart disease Maternal Grandmother     MI  . Heart disease Maternal Grandfather     MI  . Hyperlipidemia Brother   . Hypertension Brother   . Hypertension    . Stroke    . Colon cancer Neg Hx   . Prostate cancer Neg Hx   . Breast cancer Neg Hx   . Rectal cancer Neg Hx   . Diabetes Neg Hx     BP 130/60 (BP Location: Left Arm, Patient Position: Sitting, Cuff Size: Large)   Pulse 79   Temp 98.1 F (36.7 C) (Oral)   Ht  (1.727 m)   Wt 180 lb (81.6 kg)   SpO2 98%   BMI 27.37 kg/m  General: Awake, alert, appears stated age HEENT: AT, [place], ears patent b/l and TM's neg,  nares patent w/o discharge, turbinates are boggy, mildly swollen on the left, no tenderness to palpation over the sinuses, pharynx pink and without exudates, MMM Neck: No masses or asymmetry Heart: RRR, no murmurs, no bruits Lungs: CTAB, no accessory muscle use Psych: Age appropriate judgment and insight, normal mood and affect  Acute seasonal allergic rhinitis due to pollen - Plan: fluticasone (FLONASE) 50 MCG/ACT nasal spray  Orders as above. Recommended taking a daily antihistamine in addition to starting Flonase. Continue to push fluids, practice good hand hygiene, cover mouth when coughing. F/u prn. If starting to experience fevers, shaking, or shortness of breath, seek immediate care. Pt voiced understanding and agreement to the plan.  Jilda Roche [place], DO 07/06/16 2:59 PM

## 2016-07-06 NOTE — Patient Instructions (Signed)
Claritin (loratadine), Allegra (fexofenadine), Zyrtec (cetirizine); these are listed in order from weakest to strongest. Generic, and therefore cheaper, options are in the parentheses.   Flonase (fluticasone); nasal spray that is over the counter. 2 sprays each nostril, once daily. Aim towards the same side eye when you spray.  There are available OTC, and the generic versions, which may be cheaper, are in parentheses. Show this to a pharmacist if you have trouble finding any of these items.  Continue to push fluids, practice good hand hygiene, and cover your mouth if you cough.  If you start having fevers, shaking or shortness of breath, seek immediate care.  

## 2016-07-06 NOTE — Progress Notes (Signed)
Pre visit review using our clinic review tool, if applicable. No additional management support is needed unless otherwise documented below in the visit note. 

## 2016-07-07 ENCOUNTER — Encounter: Payer: Self-pay | Admitting: Gastroenterology

## 2016-07-07 ENCOUNTER — Ambulatory Visit (INDEPENDENT_AMBULATORY_CARE_PROVIDER_SITE_OTHER): Payer: BLUE CROSS/BLUE SHIELD | Admitting: Gastroenterology

## 2016-07-07 VITALS — BP 130/60 | HR 84 | Ht 68.0 in | Wt 175.0 lb

## 2016-07-07 DIAGNOSIS — K648 Other hemorrhoids: Secondary | ICD-10-CM | POA: Diagnosis not present

## 2016-07-07 DIAGNOSIS — K625 Hemorrhage of anus and rectum: Secondary | ICD-10-CM | POA: Diagnosis not present

## 2016-07-07 DIAGNOSIS — K5900 Constipation, unspecified: Secondary | ICD-10-CM

## 2016-07-07 NOTE — Progress Notes (Addendum)
HPI :  56 y/o male with a history of DM, HLD, HTN, here for an office visit for rectal bleeding and constipation. Former patient of Dr. Juanda Chance, not seen since 2013, new patient to me.   He reports having some rectal bleeding for 6 months. He sees bright red blood in the stool, mixed in the stool itself. He states about 1/3rd of bowel movements with blood. He states he had 2 BMs per day prior to his hernia surgery, and after his surgery in May 2017, has had severe constipation. He may have a bowel movement once per month if he doesn't take anything, usually passes stool once every few weeks. He has straining to get stool out. He has perianal pain when he sees the blood. He took some Miralax once per day, which he thinks helped for a bit, not taking much at this time. No abdominal pains. No weight loss. No FH of colon cancer. He never had bleeding prior to this started.   He had a normal CBC with Hgb of 13.0 in 2/28 as well as normal iron studies.   Colonoscopy 12/25/2011 -  normal exam per Dr. Juanda Chance, no polyps biopsies c/w melanosis coli  Past Medical History:  Diagnosis Date  . Anemia 12/22/2015  . Arthritis   . Chicken pox as a child  . Diabetes mellitus type I (HCC)    uses insulin pump// managed by Dr. Casimiro Needle Altheimer  . 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 Hyperlipidemia   . Hypertension   . Injury of right rotator cuff 08/16/2012   Has been seen by [facility]  . Left flank pain 09/12/2015  . Mumps as a child  . Pain in joint, lower leg 01/24/2016  . Preventative health care 02/19/2013  . Sun-damaged skin 12/06/2015     Past Surgical History:  Procedure Laterality Date  . insulin pump     placed and removed  . REFRACTIVE SURGERY Right 2007   [facility]  . ROTATOR CUFF REPAIR  2004   left   Family History  Problem Relation Age of Onset  . Hypertension Mother   . Stroke Father   . Hypertension Father   . Hyperlipidemia Father     . Hypertension Brother   . Hyperlipidemia Brother   . Mental illness Brother     depression  . Heart disease Maternal Grandmother     MI  . Heart disease Maternal Grandfather     MI  . Hyperlipidemia Brother   . Hypertension Brother   . Colon cancer Neg Hx   . Prostate cancer Neg Hx   . Breast cancer Neg Hx   . Rectal cancer Neg Hx   . Diabetes Neg Hx    Social History  Substance Use Topics  . Smoking status: Never Smoker  . Smokeless tobacco: Never Used  . Alcohol use No   Current Outpatient Prescriptions  Medication Sig Dispense Refill  . aspirin 81 MG tablet Take 81 mg by mouth daily.    . Cholecalciferol (VITAMIN D3) 1000 UNITS CAPS Take 2,000 Units by mouth daily.    . fluticasone (FLONASE) 50 MCG/ACT nasal spray Place 2 sprays into both nostrils daily. 48 g 2  . insulin detemir (LEVEMIR) 100 UNIT/ML injection Inject 0.1 mLs (10 Units total) into the skin at bedtime. 10 mL 5  . lisinopril (PRINIVIL,ZESTRIL) 20 MG tablet Take 1 tablet (20 mg total) by mouth 2 (two) times daily. 180 tablet  1  . NOVOLOG 100 UNIT/ML injection INJECT 7-20 UNITS INTO THE SKIN THREE TIMES DAILY WITH MEALS. CHANGES DOSES SEASONALLY 40 mL 4  . simvastatin (ZOCOR) 40 MG tablet Take 1 tablet (40 mg total) by mouth daily. 90 tablet 3   No current facility-administered medications for this visit.    Allergies  Allergen Reactions  . Viagra [Sildenafil Citrate] Other (See Comments)    headache     Review of Systems: All systems reviewed and negative except where noted in HPI.   Lab Results  Component Value Date   WBC 9.1 06/03/2016   HGB 13.0 06/03/2016   HCT 39.7 06/03/2016   MCV 88.6 06/03/2016   PLT 197.0 06/03/2016    Lab Results  Component Value Date   IRON 79 06/03/2016   FERRITIN 116.0 06/03/2016    Lab Results  Component Value Date   CREATININE 0.97 04/27/2016   BUN 23 04/27/2016   NA 142 04/27/2016   K 5.0 04/27/2016   CL 105 04/27/2016   CO2 33 (H) 04/27/2016     Lab Results  Component Value Date   ALT 17 04/27/2016   AST 21 04/27/2016   ALKPHOS 54 04/27/2016   BILITOT 0.4 04/27/2016     Physical Exam: BP 130/60   Pulse 84   Ht  (1.727 m)   Wt 175 lb (79.4 kg)   BMI 26.61 kg/m  Constitutional: Pleasant,well-developed, male in no acute distress. HEENT: Normocephalic and atraumatic. Conjunctivae are normal. No scleral icterus. Neck supple.  Cardiovascular: Normal rate, regular rhythm.  Pulmonary/chest: Effort normal and breath sounds normal. No wheezing, rales or rhonchi. Abdominal: Soft, nondistended, nontender. Bowel sounds active throughout. There are no masses palpable. No hepatomegaly. Extremities: no edema DRE / Anoscopy - no fissure, no external hemorrhoids, moderately inflamed internal hemorrhoids, largest in RP position Lymphadenopathy: No cervical adenopathy noted. Neurological: Alert and oriented to person place and time. Skin: Skin is warm and dry. No rashes noted. Psychiatric: Normal mood and affect. Behavior is normal.   ASSESSMENT AND PLAN: 56 year old male presenting with rectal bleeding in the setting of severe constipation. Endoscopy today shows inflamed internal hemorrhoids which is a very likely source of his bleeding. That being said I offered him a colonoscopy given it has been almost 5 years since his last exam, to ensure no other pathology. Following a discussion of this he wished to hold off on colonoscopy and treat hemorrhoids first which is reasonable. He has no anemia on iron studies are normal which is reassuring.   Recommend we treat his constipation aggressively which should help minimize the rectal bleeding, he needs to avoid straining at all possible. MiraLAX has seemed to help at low dose but will increase to double dose twice daily and I discussed how to titrate this with him.  If this is not helping he should contact me and we'll try Linzess. He will also try applying hydrocortizone cream using  glycerin suppositories to see if this helps (much cheaper than Anusol). Otherwise we discussed other more invasive hemorrhoidal treatments, namely banding and surgery. I offered banding today which I think would help, but he wished to hold off on this for now. If he is not improving after a few weeks of medical therapy I asked him to contact me for reassessment, he would consider banding and or colonoscopy in this setting. All questions answered.   Ileene Patrick, MD [facility] Pager [phone]  CC: Sharlene Dory*

## 2016-07-07 NOTE — Patient Instructions (Signed)
If you are age 56 or older, your body mass index should be between 23-30. Your Body mass index is 26.61 kg/m. If this is out of the aforementioned range listed, please consider follow up with your Primary Care Provider.  If you are age 78 or younger, your body mass index should be between 19-25. Your Body mass index is 26.61 kg/m. If this is out of the aformentioned range listed, please consider follow up with your Primary Care Provider.   Please do a double dose of Miralax in the morning and a double dose in the evening. You may decrease this if symptoms improve.  Please purchase over the counter Hydrocortisone Suppository and use as directed.  Please call Dr. Adela Lank if you have no improvement.  Thank you.

## 2016-07-09 ENCOUNTER — Ambulatory Visit (INDEPENDENT_AMBULATORY_CARE_PROVIDER_SITE_OTHER): Payer: BLUE CROSS/BLUE SHIELD | Admitting: Medical

## 2016-07-09 ENCOUNTER — Encounter: Payer: Self-pay | Admitting: Medical

## 2016-07-09 VITALS — BP 128/68 | HR 78 | Temp 98.0°F | Resp 16 | Ht 68.0 in | Wt 176.2 lb

## 2016-07-09 DIAGNOSIS — J111 Influenza due to unidentified influenza virus with other respiratory manifestations: Secondary | ICD-10-CM | POA: Diagnosis not present

## 2016-07-09 DIAGNOSIS — J40 Bronchitis, not specified as acute or chronic: Secondary | ICD-10-CM

## 2016-07-09 DIAGNOSIS — J301 Allergic rhinitis due to pollen: Secondary | ICD-10-CM

## 2016-07-09 MED ORDER — BENZONATATE 100 MG PO CAPS: 100.0000 mg | ORAL_CAPSULE | Freq: Three times a day (TID) | ORAL | 0 refills | Status: DC | PRN

## 2016-07-09 MED ORDER — AZITHROMYCIN 250 MG PO TABS: ORAL_TABLET | ORAL | 0 refills | Status: DC

## 2016-07-09 NOTE — Progress Notes (Signed)
Pre visit review using our clinic review tool, if applicable. No additional management support is needed unless otherwise documented below in the visit note. 

## 2016-07-09 NOTE — Patient Instructions (Addendum)
For recent allergies continue flonase.  You tested + fo flu(when wife ran test) but passed 48 hour hour treatment window so not prescribing tamiflu today.  You appear to have secondary infection bronchitis and possible sinusitis following allergies and the flu. Will rx azithromycin antibiotic. For cough rx benzonatate.   Follow up in 7-10 days or as needed   Counseled pt to eat low sugar diet. Recent high of 384 likely related to infection. Continue diabetic med regimen/insulin.

## 2016-07-09 NOTE — Progress Notes (Signed)
Subjective:    Patient ID: Scott Adkins, male    DOB: 08/26/1960, 56 y.o.   MRN: [id number]  HPI  Pt in states since Thursday of last week of nasal congestion. No sneezing. Pt feels chest congestion as well. When he blows nose get colored mucous from nose when he blows and some productive cough. Monday this week cough became productive. Pt states flonase helped his congestion some.   Pt does have history of seasonal allergies.   Pt states his wife did rapid flu test on him Monday. Came back +. Pt wife nurse at MD office. Sick  over 4 days with above signs/symptoms.   Review of Systems  Constitutional: Negative for chills, fatigue and fever.  HENT: Positive for postnasal drip and sinus pressure. Negative for congestion, facial swelling, hearing loss, mouth sores, tinnitus and trouble swallowing.   Respiratory: Positive for cough. Negative for chest tightness, shortness of breath and wheezing.        Chest congestion.  Cardiovascular: Negative for chest pain and palpitations.  Gastrointestinal: Negative for abdominal pain.  Genitourinary: Negative for decreased urine volume, dysuria, flank pain, frequency, penile swelling and testicular pain.  Musculoskeletal: Negative for back pain and gait problem.  Neurological: Negative for dizziness, seizures and headaches.  Hematological: Negative for adenopathy. Does not bruise/bleed easily.  Psychiatric/Behavioral: Negative for agitation, behavioral problems, confusion and sleep disturbance. The patient is not nervous/anxious.    Past Medical History:  Diagnosis Date  . Anemia 12/22/2015  . Arthritis   . Chicken pox as a child  . Diabetes mellitus type I (HCC)    uses insulin pump// managed by Dr. Casimiro Needle Altheimer  . 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 Hyperlipidemia   . Hypertension   . Injury of right rotator cuff 08/16/2012   Has been seen by [facility]  . Left flank pain 09/12/2015  . Mumps as a  child  . Pain in joint, lower leg 01/24/2016  . Preventative health care 02/19/2013  . Sun-damaged skin 12/06/2015     Social History   Social History  . Marital status: Married    Spouse name: N/A  . Number of children: 3  . Years of education: N/A   Occupational History  . welder    Social History Main Topics  . Smoking status: Never Smoker  . Smokeless tobacco: Never Used  . Alcohol use No  . Drug use: No  . Sexual activity: Not on file     Comment: lives with wife and mother in law, no dietary restrictions   Other Topics Concern  . Not on file   Social History Narrative   Married 14 yrs   2 daughters; 1 son   Occupation: Psychologist, occupational          Past Surgical History:  Procedure Laterality Date  . insulin pump     placed and removed  . REFRACTIVE SURGERY Right 2007   [facility]  . ROTATOR CUFF REPAIR  2004   left    Family History  Problem Relation Age of Onset  . Hypertension Mother   . Stroke Father   . Hypertension Father   . Hyperlipidemia Father   . Hypertension Brother   . Hyperlipidemia Brother   . Mental illness Brother     depression  . Heart disease Maternal Grandmother     MI  . Heart disease Maternal Grandfather     MI  . Hyperlipidemia Brother   .  Hypertension Brother   . Colon cancer Neg Hx   . Prostate cancer Neg Hx   . Breast cancer Neg Hx   . Rectal cancer Neg Hx   . Diabetes Neg Hx     Allergies  Allergen Reactions  . Viagra [Sildenafil Citrate] Other (See Comments)    headache    Current Outpatient Prescriptions on File Prior to Visit  Medication Sig Dispense Refill  . aspirin 81 MG tablet Take 81 mg by mouth daily.    . Cholecalciferol (VITAMIN D3) 1000 UNITS CAPS Take 2,000 Units by mouth daily.    . fluticasone (FLONASE) 50 MCG/ACT nasal spray Place 2 sprays into both nostrils daily. 48 g 2  . insulin detemir (LEVEMIR) 100 UNIT/ML injection Inject 0.1 mLs (10 Units total) into the skin at bedtime. 10 mL 5  . lisinopril  (PRINIVIL,ZESTRIL) 20 MG tablet Take 1 tablet (20 mg total) by mouth 2 (two) times daily. 180 tablet 1  . NOVOLOG 100 UNIT/ML injection INJECT 7-20 UNITS INTO THE SKIN THREE TIMES DAILY WITH MEALS. CHANGES DOSES SEASONALLY 40 mL 4  . simvastatin (ZOCOR) 40 MG tablet Take 1 tablet (40 mg total) by mouth daily. 90 tablet 3   No current facility-administered medications on file prior to visit.     BP 128/68 (BP Location: Right Arm, Patient Position: Sitting, Cuff Size: Normal)   Pulse 78   Temp 98 F (36.7 C) (Oral)   Resp 16   Ht  (1.727 m)   Wt 176 lb 3.2 oz (79.9 kg)   SpO2 100%   BMI 26.79 kg/m       Objective:   Physical Exam   General  Mental Status - Alert. General Appearance - Well groomed. Not in acute distress.  Skin Rashes- No Rashes.  HEENT Head- Normal. Ear Auditory Canal - Left- Normal. Right - Normal.Tympanic Membrane- Left- Normal. Right- Normal. Eye Sclera/Conjunctiva- Left- Normal. Right- Normal. Nose & Sinuses Nasal Mucosa- Left-  Boggy and Congested. Right-  Boggy and  Congested.Bilateral maxillary and frontal sinus pressure. Mouth & Throat Lips: Upper Lip- Normal: no dryness, cracking, pallor, cyanosis, or vesicular eruption. Lower Lip-Normal: no dryness, cracking, pallor, cyanosis or vesicular eruption. Buccal Mucosa- Bilateral- No Aphthous ulcers. Oropharynx- No Discharge or Erythema. Tonsils: Characteristics- Bilateral- No Erythema or Congestion. Size/Enlargement- Bilateral- No enlargement. Discharge- bilateral-None.  Neck Neck- Supple. No Masses.   Chest and Lung Exam Auscultation: Breath Sounds:-Clear even and unlabored.  Cardiovascular Auscultation:Rythm- Regular, rate and rhythm. Murmurs & Other Heart Sounds:Ausculatation of the heart reveal- No Murmurs.  Lymphatic Head & Neck General Head & Neck Lymphatics: Bilateral: Description- No Localized lymphadenopathy.      Assessment & Plan:  For recent allergies continue  flonase.  You tested + fo flu(when wife ran test) but passed 48 hour hour treatment window so not prescribing tamiflu today.  You appear to have secondary infection bronchitis and possible sinusitis following allergies and the flu. Will rx azithromycin antibiotic. For cough rx benzonatate.   Follow up in 7-10 days or as needed  Trig Mcbryar, Ramon Dredge, [employer]

## 2016-08-05 ENCOUNTER — Other Ambulatory Visit: Payer: Self-pay | Admitting: Family Medicine

## 2016-08-20 ENCOUNTER — Other Ambulatory Visit (INDEPENDENT_AMBULATORY_CARE_PROVIDER_SITE_OTHER): Payer: BLUE CROSS/BLUE SHIELD

## 2016-08-20 DIAGNOSIS — I10 Essential (primary) hypertension: Secondary | ICD-10-CM | POA: Diagnosis not present

## 2016-08-20 DIAGNOSIS — E782 Mixed hyperlipidemia: Secondary | ICD-10-CM | POA: Diagnosis not present

## 2016-08-20 DIAGNOSIS — E103292 Type 1 diabetes mellitus with mild nonproliferative diabetic retinopathy without macular edema, left eye: Secondary | ICD-10-CM

## 2016-08-20 DIAGNOSIS — D649 Anemia, unspecified: Secondary | ICD-10-CM | POA: Diagnosis not present

## 2016-08-21 ENCOUNTER — Other Ambulatory Visit: Payer: Self-pay | Admitting: Family Medicine

## 2016-08-21 LAB — COMPREHENSIVE METABOLIC PANEL
ALT: 18 U/L (ref 0–53)
AST: 23 U/L (ref 0–37)
Albumin: 4.2 g/dL (ref 3.5–5.2)
Alkaline Phosphatase: 57 U/L (ref 39–117)
BILIRUBIN TOTAL: 0.4 mg/dL (ref 0.2–1.2)
BUN: 30 mg/dL — ABNORMAL HIGH (ref 6–23)
CHLORIDE: 107 meq/L (ref 96–112)
CO2: 28 meq/L (ref 19–32)
CREATININE: 1.09 mg/dL (ref 0.40–1.50)
Calcium: 9.7 mg/dL (ref 8.4–10.5)
GFR: 74.34 mL/min (ref >60.00)
GLUCOSE: 122 mg/dL — AB (ref 70–99)
Potassium: 5.3 mEq/L — ABNORMAL HIGH (ref 3.5–5.1)
Sodium: 142 mEq/L (ref 135–145)
Total Protein: 6.8 g/dL (ref 6.0–8.3)

## 2016-08-21 LAB — CBC
HCT: 35.6 % — ABNORMAL LOW (ref 39.0–52.0)
Hemoglobin: 11.5 g/dL — ABNORMAL LOW (ref 13.0–17.0)
MCHC: 32.3 g/dL (ref 30.0–36.0)
MCV: 90.3 fl (ref 78.0–100.0)
Platelets: 218 10*3/uL (ref 150.0–400.0)
RBC: 3.94 Mil/uL — ABNORMAL LOW (ref 4.22–5.81)
RDW: 13.6 % (ref 11.5–15.5)
WBC: 8.6 10*3/uL (ref 4.0–10.5)

## 2016-08-21 LAB — LIPID PANEL
CHOL/HDL RATIO: 3
Cholesterol: 158 mg/dL (ref 0–200)
HDL: 58.6 mg/dL (ref >39.00)
LDL CALC: 79 mg/dL (ref 0–99)
NONHDL: 99.52
Triglycerides: 101 mg/dL (ref 0.0–149.0)
VLDL: 20.2 mg/dL (ref 0.0–40.0)

## 2016-08-21 LAB — TSH: TSH: 1.23 u[IU]/mL (ref 0.35–4.50)

## 2016-08-21 LAB — HEMOGLOBIN A1C: HEMOGLOBIN A1C: 6.8 % — AB (ref 4.6–6.5)

## 2016-08-21 MED ORDER — FERROUS FUMARATE 324 (106 FE) MG PO TABS: 1.0000 | ORAL_TABLET | Freq: Every day | ORAL | 3 refills | Status: DC

## 2016-08-27 ENCOUNTER — Encounter: Payer: Self-pay | Admitting: Family Medicine

## 2016-08-27 ENCOUNTER — Ambulatory Visit (INDEPENDENT_AMBULATORY_CARE_PROVIDER_SITE_OTHER): Payer: BLUE CROSS/BLUE SHIELD | Admitting: Family Medicine

## 2016-08-27 VITALS — BP 118/68 | HR 59 | Temp 97.7°F | Resp 18 | Ht 68.0 in | Wt 180.8 lb

## 2016-08-27 DIAGNOSIS — D649 Anemia, unspecified: Secondary | ICD-10-CM

## 2016-08-27 DIAGNOSIS — I10 Essential (primary) hypertension: Secondary | ICD-10-CM

## 2016-08-27 DIAGNOSIS — E782 Mixed hyperlipidemia: Secondary | ICD-10-CM

## 2016-08-27 DIAGNOSIS — R252 Cramp and spasm: Secondary | ICD-10-CM | POA: Diagnosis not present

## 2016-08-27 DIAGNOSIS — E103292 Type 1 diabetes mellitus with mild nonproliferative diabetic retinopathy without macular edema, left eye: Secondary | ICD-10-CM

## 2016-08-27 DIAGNOSIS — J45909 Unspecified asthma, uncomplicated: Secondary | ICD-10-CM

## 2016-08-27 MED ORDER — DOXYCYCLINE HYCLATE 100 MG PO TABS: 100.0000 mg | ORAL_TABLET | Freq: Two times a day (BID) | ORAL | 0 refills | Status: DC

## 2016-08-27 NOTE — Assessment & Plan Note (Signed)
Exacerabation. Encouraged increased rest and hydration, add probiotics, zinc such as Coldeze or Xicam. Treat fevers as needed, vit C, elderberry, garlic, mucinex and given Doxycycline to use if no improvement

## 2016-08-27 NOTE — Assessment & Plan Note (Signed)
Increase hydration and check cmp

## 2016-08-27 NOTE — Progress Notes (Signed)
Subjective:  I acted as a Neurosurgeon for Dr. Abner Greenspan. Scott Adkins, [place]   Patient ID: Scott Adkins, male    DOB: 01-05-1961, 56 y.o.   MRN: [id number]  Chief Complaint  Patient presents with  . Follow-up  . Hypertension  . Diabetes    HPI  Patient is in today for a follow up. He is following up on his HTN DM, and other medical concerns. Patient has no acute concerns. No recent febrile illness or acute hospitalizations. Denies CP/palp/SOB/HA/congestion/fevers/GI or GU c/o. Taking meds as prescribed. He reports being in a month or so ago with bronchitis with bad cough. He reports no response to Azithromycin. Denies CP/palp/SOB/HA/fevers/GI or GU c/o. Taking meds as prescribed   Patient Care Team: Bradd Canary, MD as PCP - General (Family Medicine)   Past Medical History:  Diagnosis Date  . Anemia 12/22/2015  . Arthritis   . Chicken pox as a child  . Diabetes mellitus type I (HCC)    uses insulin pump// managed by Dr. Casimiro Needle Altheimer  . 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 Hyperlipidemia   . Hypertension   . Injury of right rotator cuff 08/16/2012   Has been seen by [facility]  . Left flank pain 09/12/2015  . Mumps as a child  . Pain in joint, lower leg 01/24/2016  . Preventative health care 02/19/2013  . Sun-damaged skin 12/06/2015    Past Surgical History:  Procedure Laterality Date  . insulin pump     placed and removed  . REFRACTIVE SURGERY Right 2007   [facility]  . ROTATOR CUFF REPAIR  2004   left    Family History  Problem Relation Age of Onset  . Hypertension Mother   . Stroke Father   . Hypertension Father   . Hyperlipidemia Father   . Hypertension Brother   . Hyperlipidemia Brother   . Mental illness Brother        depression  . Heart disease Maternal Grandmother        MI  . Heart disease Maternal Grandfather        MI  . Hyperlipidemia Brother   . Hypertension Brother   . Colon cancer Neg Hx   . Prostate  cancer Neg Hx   . Breast cancer Neg Hx   . Rectal cancer Neg Hx   . Diabetes Neg Hx     Social History   Social History  . Marital status: Married    Spouse name: N/A  . Number of children: 3  . Years of education: N/A   Occupational History  . welder    Social History Main Topics  . Smoking status: Never Smoker  . Smokeless tobacco: Never Used  . Alcohol use No  . Drug use: No  . Sexual activity: Not on file     Comment: lives with wife and mother in law, no dietary restrictions   Other Topics Concern  . Not on file   Social History Narrative   Married 14 yrs   2 daughters; 1 son   Occupation: welder          Outpatient Medications Prior to Visit  Medication Sig Dispense Refill  . aspirin 81 MG tablet Take 81 mg by mouth daily.    . Cholecalciferol (VITAMIN D3) 1000 UNITS CAPS Take 2,000 Units by mouth daily.    . Ferrous Fumarate (HEMOCYTE) 324 (106 Fe) MG TABS tablet Take 1 tablet (106 mg of  iron total) by mouth daily. 30 tablet 3  . fluticasone (FLONASE) 50 MCG/ACT nasal spray Place 2 sprays into both nostrils daily. 48 g 2  . insulin detemir (LEVEMIR) 100 UNIT/ML injection Inject 0.1 mLs (10 Units total) into the skin at bedtime. 10 mL 5  . lisinopril (PRINIVIL,ZESTRIL) 20 MG tablet TAKE ONE TABLET BY MOUTH TWICE DAILY 180 tablet 1  . NOVOLOG 100 UNIT/ML injection INJECT 7-20 UNITS INTO THE SKIN THREE TIMES DAILY WITH MEALS. CHANGES DOSES SEASONALLY 40 mL 4  . simvastatin (ZOCOR) 40 MG tablet Take 1 tablet (40 mg total) by mouth daily. 90 tablet 3  . azithromycin (ZITHROMAX) 250 MG tablet Take 2 tablets by mouth on day 1, followed by 1 tablet by mouth daily for 4 days. 6 tablet 0  . benzonatate (TESSALON) 100 MG capsule Take 1 capsule (100 mg total) by mouth 3 (three) times daily as needed for cough. 21 capsule 0   No facility-administered medications prior to visit.     Allergies  Allergen Reactions  . Viagra [Sildenafil Citrate] Other (See Comments)     headache    Review of Systems  Constitutional: Negative for fever and malaise/fatigue.  HENT: Positive for congestion.   Eyes: Negative for blurred vision.  Respiratory: Positive for cough and sputum production. Negative for shortness of breath.   Cardiovascular: Negative for chest pain, palpitations and leg swelling.  Gastrointestinal: Negative for vomiting.  Musculoskeletal: Negative for back pain.  Skin: Negative for rash.  Neurological: Negative for loss of consciousness and headaches.       Objective:    Physical Exam  Constitutional: He is oriented to person, place, and time. He appears well-developed and well-nourished. No distress.  HENT:  Head: Normocephalic and atraumatic.  Eyes: Conjunctivae are normal.  Neck: Normal range of motion. No thyromegaly present.  Cardiovascular: Normal rate and regular rhythm.   Pulmonary/Chest: Effort normal and breath sounds normal. He has no wheezes.  Abdominal: Soft. Bowel sounds are normal. There is no tenderness.  Musculoskeletal: Normal range of motion. He exhibits no edema or deformity.  Neurological: He is alert and oriented to person, place, and time.  Skin: Skin is warm and dry. He is not diaphoretic.  Psychiatric: He has a normal mood and affect.    BP 118/68 (BP Location: Left Arm, Patient Position: Sitting, Cuff Size: Normal)   Pulse (!) 59   Temp 97.7 F (36.5 C) (Oral)   Resp 18   Ht 5\' 8"  (1.727 m)   Wt 180 lb 12.8 oz (82 kg)   SpO2 97%   BMI 27.49 kg/m  Wt Readings from Last 3 Encounters:  08/27/16 180 lb 12.8 oz (82 kg)  07/09/16 176 lb 3.2 oz (79.9 kg)  07/07/16 175 lb (79.4 kg)   BP Readings from Last 3 Encounters:  08/27/16 118/68  07/09/16 128/68  07/07/16 130/60     Immunization History  Administered Date(s) Administered  . Influenza Split 12/05/2012  . Influenza Whole 03/19/2008, 12/27/2009  . Influenza,inj,Quad PF,36+ Mos 02/15/2015, 01/23/2016  . Influenza-Unspecified 01/24/2014  .  Pneumococcal Conjugate-13 02/15/2015  . Pneumococcal Polysaccharide-23 03/19/2008, 01/23/2016  . Tdap 08/14/2013    Health Maintenance  Topic Date Due  . OPHTHALMOLOGY EXAM  08/30/2013  . INFLUENZA VACCINE  11/04/2016  . HEMOGLOBIN A1C  02/20/2017  . FOOT EXAM  04/27/2017  . COLONOSCOPY  12/24/2021  . TETANUS/TDAP  08/15/2023  . PNEUMOCOCCAL POLYSACCHARIDE VACCINE  Completed  . Hepatitis C Screening  Completed  . HIV  Screening  Completed    Lab Results  Component Value Date   WBC 8.6 08/20/2016   HGB 11.5 (L) 08/20/2016   HCT 35.6 (L) 08/20/2016   PLT 218.0 08/20/2016   GLUCOSE 122 (H) 08/20/2016   CHOL 158 08/20/2016   TRIG 101.0 08/20/2016   HDL 58.60 08/20/2016   LDLDIRECT 71 09/10/2008   LDLCALC 79 08/20/2016   ALT 18 08/20/2016   AST 23 08/20/2016   NA 142 08/20/2016   K 5.3 (H) 08/20/2016   CL 107 08/20/2016   CREATININE 1.09 08/20/2016   BUN 30 (H) 08/20/2016   CO2 28 08/20/2016   TSH 1.23 08/20/2016   PSA 0.61 02/15/2014   HGBA1C 6.8 (H) 08/20/2016   MICROALBUR <0.7 02/15/2015    Lab Results  Component Value Date   TSH 1.23 08/20/2016   Lab Results  Component Value Date   WBC 8.6 08/20/2016   HGB 11.5 (L) 08/20/2016   HCT 35.6 (L) 08/20/2016   MCV 90.3 08/20/2016   PLT 218.0 08/20/2016   Lab Results  Component Value Date   NA 142 08/20/2016   K 5.3 (H) 08/20/2016   CO2 28 08/20/2016   GLUCOSE 122 (H) 08/20/2016   BUN 30 (H) 08/20/2016   CREATININE 1.09 08/20/2016   BILITOT 0.4 08/20/2016   ALKPHOS 57 08/20/2016   AST 23 08/20/2016   ALT 18 08/20/2016   PROT 6.8 08/20/2016   ALBUMIN 4.2 08/20/2016   CALCIUM 9.7 08/20/2016   GFR 74.34 08/20/2016   Lab Results  Component Value Date   CHOL 158 08/20/2016   Lab Results  Component Value Date   HDL 58.60 08/20/2016   Lab Results  Component Value Date   LDLCALC 79 08/20/2016   Lab Results  Component Value Date   TRIG 101.0 08/20/2016   Lab Results  Component Value Date   CHOLHDL  3 08/20/2016   Lab Results  Component Value Date   HGBA1C 6.8 (H) 08/20/2016         Assessment & Plan:   Problem List Items Addressed This Visit    Hyperlipidemia    Encouraged heart healthy diet, increase exercise, avoid trans fats, consider a krill oil cap daily, tolerating Simvastatin      Essential hypertension - Primary    Well controlled, no changes to meds. Encouraged heart healthy diet such as the DASH diet and exercise as tolerated.       Relevant Orders   CBC   Comprehensive metabolic panel   Asthma    Exacerabation. Encouraged increased rest and hydration, add probiotics, zinc such as Coldeze or Xicam. Treat fevers as needed, vit C, elderberry, garlic, mucinex and given Doxycycline to use if no improvement      DM (diabetes mellitus), type 1 with ophthalmic complications (HCC)    hgba1c acceptable, minimize simple carbs. Increase exercise as tolerated. Continue current meds      Muscle cramping    Increase hydration and check cmp      Anemia    Check cbc      Relevant Orders   CBC   Comprehensive metabolic panel      I have discontinued Mr. Barnett ApplebaumJones's azithromycin and benzonatate. I am also having him start on doxycycline. Additionally, I am having him maintain his Vitamin D3, aspirin, insulin detemir, NOVOLOG, simvastatin, fluticasone, lisinopril, and Ferrous Fumarate.  Meds ordered this encounter  Medications  . doxycycline (VIBRA-TABS) 100 MG tablet    Sig: Take 1 tablet (100 mg total) by  mouth 2 (two) times daily.    Dispense:  20 tablet    Refill:  0    CMA served as scribe during this visit. History, Physical and Plan performed by medical provider. Documentation and orders reviewed and attested to.  Danise Edge, MD

## 2016-08-27 NOTE — Assessment & Plan Note (Signed)
Check cbc 

## 2016-08-27 NOTE — Assessment & Plan Note (Signed)
Encouraged heart healthy diet, increase exercise, avoid trans fats, consider a krill oil cap daily, tolerating Simvastatin 

## 2016-08-27 NOTE — Assessment & Plan Note (Signed)
Well controlled, no changes to meds. Encouraged heart healthy diet such as the DASH diet and exercise as tolerated.  °

## 2016-08-27 NOTE — Assessment & Plan Note (Signed)
hgba1c acceptable, minimize simple carbs. Increase exercise as tolerated. Continue current meds 

## 2016-08-27 NOTE — Patient Instructions (Addendum)
HYDRATE HYDRATE HYDRATE 64 OUNCES OF WATER A DAY   Elderberry liquid, vit C 500 or 1000 mg daily, aged or black garlic, zinc such as Coldeeze or Xicam, plain Mucinex twice daily x 10 days, probiotics.    Hypertension Hypertension, commonly called high blood pressure, is when the force of blood pumping through the arteries is too strong. The arteries are the blood vessels that carry blood from the heart throughout the body. Hypertension forces the heart to work harder to pump blood and may cause arteries to become narrow or stiff. Having untreated or uncontrolled hypertension can cause heart attacks, strokes, kidney disease, and other problems. A blood pressure reading consists of a higher number over a lower number. Ideally, your blood pressure should be below 120/80. The first ("top") number is called the systolic pressure. It is a measure of the pressure in your arteries as your heart beats. The second ("bottom") number is called the diastolic pressure. It is a measure of the pressure in your arteries as the heart relaxes. What are the causes? The cause of this condition is not known. What increases the risk? Some risk factors for high blood pressure are under your control. Others are not. Factors you can change   Smoking.  Having type 2 diabetes mellitus, high cholesterol, or both.  Not getting enough exercise or physical activity.  Being overweight.  Having too much fat, sugar, calories, or salt (sodium) in your diet.  Drinking too much alcohol. Factors that are difficult or impossible to change   Having chronic kidney disease.  Having a family history of high blood pressure.  Age. Risk increases with age.  Race. You may be at higher risk if you are African-American.  Gender. Men are at higher risk than women before age 56. After age 56, women are at higher risk than men.  Having obstructive sleep apnea.  Stress. What are the signs or symptoms? Extremely high blood  pressure (hypertensive crisis) may cause:  Headache.  Anxiety.  Shortness of breath.  Nosebleed.  Nausea and vomiting.  Severe chest pain.  Jerky movements you cannot control (seizures). How is this diagnosed? This condition is diagnosed by measuring your blood pressure while you are seated, with your arm resting on a surface. The cuff of the blood pressure monitor will be placed directly against the skin of your upper arm at the level of your heart. It should be measured at least twice using the same arm. Certain conditions can cause a difference in blood pressure between your right and left arms. Certain factors can cause blood pressure readings to be lower or higher than normal (elevated) for a short period of time:  When your blood pressure is higher when you are in a health care provider's office than when you are at home, this is called white coat hypertension. Most people with this condition do not need medicines.  When your blood pressure is higher at home than when you are in a health care provider's office, this is called masked hypertension. Most people with this condition may need medicines to control blood pressure. If you have a high blood pressure reading during one visit or you have normal blood pressure with other risk factors:  You may be asked to return on a different day to have your blood pressure checked again.  You may be asked to monitor your blood pressure at home for 1 week or longer. If you are diagnosed with hypertension, you may have other blood or imaging  tests to help your health care provider understand your overall risk for other conditions. How is this treated? This condition is treated by making healthy lifestyle changes, such as eating healthy foods, exercising more, and reducing your alcohol intake. Your health care provider may prescribe medicine if lifestyle changes are not enough to get your blood pressure under control, and if:  Your systolic  blood pressure is above 130.  Your diastolic blood pressure is above 80. Your personal target blood pressure may vary depending on your medical conditions, your age, and other factors. Follow these instructions at home: Eating and drinking   Eat a diet that is high in fiber and potassium, and low in sodium, added sugar, and fat. An example eating plan is called the DASH (Dietary Approaches to Stop Hypertension) diet. To eat this way:  Eat plenty of fresh fruits and vegetables. Try to fill half of your plate at each meal with fruits and vegetables.  Eat whole grains, such as whole wheat pasta, brown rice, or whole grain bread. Fill about one quarter of your plate with whole grains.  Eat or drink low-fat dairy products, such as skim milk or low-fat yogurt.  Avoid fatty cuts of meat, processed or cured meats, and poultry with skin. Fill about one quarter of your plate with lean proteins, such as fish, chicken without skin, beans, eggs, and tofu.  Avoid premade and processed foods. These tend to be higher in sodium, added sugar, and fat.  Reduce your daily sodium intake. Most people with hypertension should eat less than 1,500 mg of sodium a day.  Limit alcohol intake to no more than 1 drink a day for nonpregnant women and 2 drinks a day for men. One drink equals 12 oz of beer, 5 oz of wine, or 1 oz of hard liquor. Lifestyle   Work with your health care provider to maintain a healthy body weight or to lose weight. Ask what an ideal weight is for you.  Get at least 30 minutes of exercise that causes your heart to beat faster (aerobic exercise) most days of the week. Activities may include walking, swimming, or biking.  Include exercise to strengthen your muscles (resistance exercise), such as pilates or lifting weights, as part of your weekly exercise routine. Try to do these types of exercises for 30 minutes at least 3 days a week.  Do not use any products that contain nicotine or tobacco,  such as cigarettes and e-cigarettes. If you need help quitting, ask your health care provider.  Monitor your blood pressure at home as told by your health care provider.  Keep all follow-up visits as told by your health care provider. This is important. Medicines   Take over-the-counter and prescription medicines only as told by your health care provider. Follow directions carefully. Blood pressure medicines must be taken as prescribed.  Do not skip doses of blood pressure medicine. Doing this puts you at risk for problems and can make the medicine less effective.  Ask your health care provider about side effects or reactions to medicines that you should watch for. Contact a health care provider if:  You think you are having a reaction to a medicine you are taking.  You have headaches that keep coming back (recurring).  You feel dizzy.  You have swelling in your ankles.  You have trouble with your vision. Get help right away if:  You develop a severe headache or confusion.  You have unusual weakness or numbness.  You  feel faint.  You have severe pain in your chest or abdomen.  You vomit repeatedly.  You have trouble breathing. Summary  Hypertension is when the force of blood pumping through your arteries is too strong. If this condition is not controlled, it may put you at risk for serious complications.  Your personal target blood pressure may vary depending on your medical conditions, your age, and other factors. For most people, a normal blood pressure is less than 120/80.  Hypertension is treated with lifestyle changes, medicines, or a combination of both. Lifestyle changes include weight loss, eating a healthy, low-sodium diet, exercising more, and limiting alcohol. This information is not intended to replace advice given to you by your health care provider. Make sure you discuss any questions you have with your health care provider. Document Released: 03/23/2005  Document Revised: 02/19/2016 Document Reviewed: 02/19/2016 Elsevier Interactive Patient Education  2017 [employer].

## 2016-08-28 ENCOUNTER — Other Ambulatory Visit: Payer: Self-pay | Admitting: Family Medicine

## 2016-08-28 DIAGNOSIS — E875 Hyperkalemia: Secondary | ICD-10-CM

## 2016-08-28 LAB — COMPREHENSIVE METABOLIC PANEL
ALBUMIN: 4.3 g/dL (ref 3.5–5.2)
ALT: 16 U/L (ref 0–53)
AST: 21 U/L (ref 0–37)
Alkaline Phosphatase: 54 U/L (ref 39–117)
BILIRUBIN TOTAL: 0.4 mg/dL (ref 0.2–1.2)
BUN: 27 mg/dL — AB (ref 6–23)
CALCIUM: 9.8 mg/dL (ref 8.4–10.5)
CO2: 32 mEq/L (ref 19–32)
CREATININE: 1.08 mg/dL (ref 0.40–1.50)
Chloride: 105 mEq/L (ref 96–112)
GFR: 75.13 mL/min (ref >60.00)
Glucose, Bld: 125 mg/dL — ABNORMAL HIGH (ref 70–99)
Potassium: 5.6 mEq/L — ABNORMAL HIGH (ref 3.5–5.1)
SODIUM: 140 meq/L (ref 135–145)
TOTAL PROTEIN: 6.7 g/dL (ref 6.0–8.3)

## 2016-08-28 LAB — CBC
HCT: 37.9 % — ABNORMAL LOW (ref 39.0–52.0)
Hemoglobin: 12.2 g/dL — ABNORMAL LOW (ref 13.0–17.0)
MCHC: 32.2 g/dL (ref 30.0–36.0)
MCV: 90.6 fl (ref 78.0–100.0)
PLATELETS: 220 10*3/uL (ref 150.0–400.0)
RBC: 4.18 Mil/uL — AB (ref 4.22–5.81)
RDW: 13.6 % (ref 11.5–15.5)
WBC: 7.7 10*3/uL (ref 4.0–10.5)

## 2016-09-22 ENCOUNTER — Other Ambulatory Visit: Payer: BLUE CROSS/BLUE SHIELD

## 2016-09-23 ENCOUNTER — Other Ambulatory Visit (INDEPENDENT_AMBULATORY_CARE_PROVIDER_SITE_OTHER): Payer: BLUE CROSS/BLUE SHIELD

## 2016-09-23 DIAGNOSIS — E875 Hyperkalemia: Secondary | ICD-10-CM

## 2016-09-24 ENCOUNTER — Other Ambulatory Visit: Payer: Self-pay | Admitting: Family Medicine

## 2016-09-24 DIAGNOSIS — E875 Hyperkalemia: Secondary | ICD-10-CM

## 2016-09-24 LAB — COMPREHENSIVE METABOLIC PANEL
ALK PHOS: 54 U/L (ref 39–117)
ALT: 17 U/L (ref 0–53)
AST: 20 U/L (ref 0–37)
Albumin: 4.1 g/dL (ref 3.5–5.2)
BILIRUBIN TOTAL: 0.3 mg/dL (ref 0.2–1.2)
BUN: 29 mg/dL — ABNORMAL HIGH (ref 6–23)
CALCIUM: 9.8 mg/dL (ref 8.4–10.5)
CO2: 30 mEq/L (ref 19–32)
CREATININE: 1.29 mg/dL (ref 0.40–1.50)
Chloride: 103 mEq/L (ref 96–112)
GFR: 61.18 mL/min (ref >60.00)
GLUCOSE: 198 mg/dL — AB (ref 70–99)
Potassium: 5.5 mEq/L — ABNORMAL HIGH (ref 3.5–5.1)
Sodium: 138 mEq/L (ref 135–145)
TOTAL PROTEIN: 6.4 g/dL (ref 6.0–8.3)

## 2016-10-02 ENCOUNTER — Ambulatory Visit (INDEPENDENT_AMBULATORY_CARE_PROVIDER_SITE_OTHER): Payer: BLUE CROSS/BLUE SHIELD | Admitting: Family Medicine

## 2016-10-02 VITALS — BP 132/79 | HR 67

## 2016-10-02 DIAGNOSIS — I10 Essential (primary) hypertension: Secondary | ICD-10-CM | POA: Diagnosis not present

## 2016-10-02 NOTE — Progress Notes (Signed)
Pre visit review using our clinic review tool, if applicable. No additional management support is needed unless otherwise documented below in the visit note.  Patient came in office for blood pressure check per lab note 08/27/16. He voices compliance with medication & regimen. Patient is asymptomatic, stating, "I feel great". He denies chest pain, headaches, dizziness & lightheadedness. Today's readings were as follow: BP 132/79 P 67 & 02 98%.  Per Dr. Zola ButtonLowne Chase: Continue current medication & regimen. Keep annual physical appointment with PCP on 12/22/16 at 4:00 PM.  Patient was made aware of the provider's recommendations & verbalized understanding.

## 2016-10-02 NOTE — Patient Instructions (Addendum)
Per Dr. Zola ButtonLowne Chase: Continue current medication & regimen. Keep annual physical appointment with PCP on 12/22/16 at 4:00 PM.

## 2016-10-02 NOTE — Progress Notes (Signed)
Malak Orantes R Lowne Chase, DO 

## 2016-10-04 NOTE — Progress Notes (Signed)
Nurseblood pressure check note reviewed. Agree with documention and plan. 

## 2016-10-26 ENCOUNTER — Other Ambulatory Visit: Payer: Self-pay

## 2016-12-15 ENCOUNTER — Other Ambulatory Visit (INDEPENDENT_AMBULATORY_CARE_PROVIDER_SITE_OTHER): Payer: PRIVATE HEALTH INSURANCE

## 2016-12-15 DIAGNOSIS — E875 Hyperkalemia: Secondary | ICD-10-CM

## 2016-12-16 LAB — COMPREHENSIVE METABOLIC PANEL
ALK PHOS: 62 U/L (ref 39–117)
ALT: 17 U/L (ref 0–53)
AST: 21 U/L (ref 0–37)
Albumin: 4.3 g/dL (ref 3.5–5.2)
BILIRUBIN TOTAL: 0.4 mg/dL (ref 0.2–1.2)
BUN: 34 mg/dL — ABNORMAL HIGH (ref 6–23)
CALCIUM: 10 mg/dL (ref 8.4–10.5)
CO2: 28 meq/L (ref 19–32)
CREATININE: 1.37 mg/dL (ref 0.40–1.50)
Chloride: 102 mEq/L (ref 96–112)
GFR: 57.03 mL/min — AB (ref >60.00)
GLUCOSE: 82 mg/dL (ref 70–99)
Potassium: 5.5 mEq/L — ABNORMAL HIGH (ref 3.5–5.1)
Sodium: 138 mEq/L (ref 135–145)
TOTAL PROTEIN: 7 g/dL (ref 6.0–8.3)

## 2016-12-22 ENCOUNTER — Encounter: Payer: Self-pay | Admitting: Family Medicine

## 2016-12-22 ENCOUNTER — Ambulatory Visit (INDEPENDENT_AMBULATORY_CARE_PROVIDER_SITE_OTHER): Payer: PRIVATE HEALTH INSURANCE | Admitting: Family Medicine

## 2016-12-22 VITALS — BP 128/80 | HR 63 | Temp 98.7°F | Ht 67.0 in | Wt 184.0 lb

## 2016-12-22 DIAGNOSIS — E875 Hyperkalemia: Secondary | ICD-10-CM

## 2016-12-22 DIAGNOSIS — Z0001 Encounter for general adult medical examination with abnormal findings: Secondary | ICD-10-CM | POA: Diagnosis not present

## 2016-12-22 DIAGNOSIS — D649 Anemia, unspecified: Secondary | ICD-10-CM

## 2016-12-22 DIAGNOSIS — K625 Hemorrhage of anus and rectum: Secondary | ICD-10-CM | POA: Diagnosis not present

## 2016-12-22 DIAGNOSIS — E782 Mixed hyperlipidemia: Secondary | ICD-10-CM | POA: Diagnosis not present

## 2016-12-22 DIAGNOSIS — R252 Cramp and spasm: Secondary | ICD-10-CM | POA: Diagnosis not present

## 2016-12-22 DIAGNOSIS — Z125 Encounter for screening for malignant neoplasm of prostate: Secondary | ICD-10-CM | POA: Diagnosis not present

## 2016-12-22 DIAGNOSIS — Z0000 Encounter for general adult medical examination without abnormal findings: Secondary | ICD-10-CM

## 2016-12-22 DIAGNOSIS — K5900 Constipation, unspecified: Secondary | ICD-10-CM

## 2016-12-22 DIAGNOSIS — I10 Essential (primary) hypertension: Secondary | ICD-10-CM | POA: Diagnosis not present

## 2016-12-22 DIAGNOSIS — E103292 Type 1 diabetes mellitus with mild nonproliferative diabetic retinopathy without macular edema, left eye: Secondary | ICD-10-CM | POA: Diagnosis not present

## 2016-12-22 HISTORY — DX: Constipation, unspecified: K59.00

## 2016-12-22 MED ORDER — LACTULOSE 10 GM/15ML PO SOLN: 10.0000 g | Freq: Two times a day (BID) | ORAL | 5 refills | Status: DC | PRN

## 2016-12-22 MED ORDER — CLOTRIMAZOLE 1 % EX CREA: 1.0000 "application " | TOPICAL_CREAM | Freq: Two times a day (BID) | CUTANEOUS | Status: DC

## 2016-12-22 NOTE — Patient Instructions (Signed)
Hyland's leg cramp medicine as needed Preventive Care 40-64 Years, Male Preventive care refers to lifestyle choices and visits with your health care provider that can promote health and wellness. What does preventive care include?  A yearly physical exam. This is also called an annual well check.  Dental exams once or twice a year.  Routine eye exams. Ask your health care provider how often you should have your eyes checked.  Personal lifestyle choices, including: ? Daily care of your teeth and gums. ? Regular physical activity. ? Eating a healthy diet. ? Avoiding tobacco and drug use. ? Limiting alcohol use. ? Practicing safe sex. ? Taking low-dose aspirin every day starting at age 87. What happens during an annual well check? The services and screenings done by your health care provider during your annual well check will depend on your age, overall health, lifestyle risk factors, and family history of disease. Counseling Your health care provider may ask you questions about your:  Alcohol use.  Tobacco use.  Drug use.  Emotional well-being.  Home and relationship well-being.  Sexual activity.  Eating habits.  Work and work Statistician.  Screening You may have the following tests or measurements:  Height, weight, and BMI.  Blood pressure.  Lipid and cholesterol levels. These may be checked every 5 years, or more frequently if you are over 34 years old.  Skin check.  Lung cancer screening. You may have this screening every year starting at age 41 if you have a 30-pack-year history of smoking and currently smoke or have quit within the past 15 years.  Fecal occult blood test (FOBT) of the stool. You may have this test every year starting at age 49.  Flexible sigmoidoscopy or colonoscopy. You may have a sigmoidoscopy every 5 years or a colonoscopy every 10 years starting at age 7.  Prostate cancer screening. Recommendations will vary depending on your family  history and other risks.  Hepatitis C blood test.  Hepatitis B blood test.  Sexually transmitted disease (STD) testing.  Diabetes screening. This is done by checking your blood sugar (glucose) after you have not eaten for a while (fasting). You may have this done every 1-3 years.  Discuss your test results, treatment options, and if necessary, the need for more tests with your health care provider. Vaccines Your health care provider may recommend certain vaccines, such as:  Influenza vaccine. This is recommended every year.  Tetanus, diphtheria, and acellular pertussis (Tdap, Td) vaccine. You may need a Td booster every 10 years.  Varicella vaccine. You may need this if you have not been vaccinated.  Zoster vaccine. You may need this after age 57.  Measles, mumps, and rubella (MMR) vaccine. You may need at least one dose of MMR if you were born in 1957 or later. You may also need a second dose.  Pneumococcal 13-valent conjugate (PCV13) vaccine. You may need this if you have certain conditions and have not been vaccinated.  Pneumococcal polysaccharide (PPSV23) vaccine. You may need one or two doses if you smoke cigarettes or if you have certain conditions.  Meningococcal vaccine. You may need this if you have certain conditions.  Hepatitis A vaccine. You may need this if you have certain conditions or if you travel or work in places where you may be exposed to hepatitis A.  Hepatitis B vaccine. You may need this if you have certain conditions or if you travel or work in places where you may be exposed to hepatitis B.  Haemophilus influenzae type b (Hib) vaccine. You may need this if you have certain risk factors.  Talk to your health care provider about which screenings and vaccines you need and how often you need them. This information is not intended to replace advice given to you by your health care provider. Make sure you discuss any questions you have with your health care  provider. Document Released: 04/19/2015 Document Revised: 12/11/2015 Document Reviewed: 01/22/2015 Elsevier Interactive Patient Education  2017 [employer] Inc[employer]  

## 2016-12-22 NOTE — Assessment & Plan Note (Addendum)
Try lactulose once to twice daily as needed. Encouraged increased hydration and fiber in diet. Daily probiotics. If bowels not moving can use MOM 2 tbls po in 4 oz of warm prune juice by mouth every 2-3 days. If no results then repeat in 4 hours with  Dulcolax suppository pr, may repeat again in 4 more hours as needed. Seek care if symptoms worsen. Consider daily Miralax and/or Dulcolax if symptoms persist.

## 2016-12-22 NOTE — Assessment & Plan Note (Signed)
hgba1c acceptable, minimize simple carbs. Increase exercise as tolerated. Continue current meds 

## 2016-12-22 NOTE — Assessment & Plan Note (Signed)
hyland's leg cramp med

## 2016-12-22 NOTE — Assessment & Plan Note (Signed)
Increase leafy greens, consider increased lean red meat and using cast iron cookware. Continue to monitor, report any concerns 

## 2016-12-22 NOTE — Assessment & Plan Note (Signed)
Well controlled, no changes to meds. Encouraged heart healthy diet such as the DASH diet and exercise as tolerated.  °

## 2016-12-22 NOTE — Assessment & Plan Note (Signed)
Tolerating statin, encouraged heart healthy diet, avoid trans fats, minimize simple carbs and saturated fats. Increase exercise as tolerated 

## 2016-12-23 LAB — LIPID PANEL
CHOLESTEROL: 164 mg/dL (ref 0–200)
HDL: 70.9 mg/dL (ref >39.00)
LDL Cholesterol: 76 mg/dL (ref 0–99)
NonHDL: 93.48
TRIGLYCERIDES: 85 mg/dL (ref 0.0–149.0)
Total CHOL/HDL Ratio: 2
VLDL: 17 mg/dL (ref 0.0–40.0)

## 2016-12-23 LAB — CBC
HEMATOCRIT: 36 % — AB (ref 39.0–52.0)
HEMOGLOBIN: 11.6 g/dL — AB (ref 13.0–17.0)
MCHC: 32.4 g/dL (ref 30.0–36.0)
MCV: 90.1 fl (ref 78.0–100.0)
PLATELETS: 211 10*3/uL (ref 150.0–400.0)
RBC: 3.99 Mil/uL — ABNORMAL LOW (ref 4.22–5.81)
RDW: 13 % (ref 11.5–15.5)
WBC: 8.5 10*3/uL (ref 4.0–10.5)

## 2016-12-23 LAB — COMPREHENSIVE METABOLIC PANEL
ALBUMIN: 4.2 g/dL (ref 3.5–5.2)
ALK PHOS: 53 U/L (ref 39–117)
ALT: 17 U/L (ref 0–53)
AST: 23 U/L (ref 0–37)
BILIRUBIN TOTAL: 0.3 mg/dL (ref 0.2–1.2)
BUN: 28 mg/dL — ABNORMAL HIGH (ref 6–23)
CALCIUM: 9.7 mg/dL (ref 8.4–10.5)
CO2: 31 mEq/L (ref 19–32)
Chloride: 104 mEq/L (ref 96–112)
Creatinine, Ser: 1.01 mg/dL (ref 0.40–1.50)
GFR: 81.07 mL/min (ref >60.00)
GLUCOSE: 100 mg/dL — AB (ref 70–99)
POTASSIUM: 4.6 meq/L (ref 3.5–5.1)
Sodium: 140 mEq/L (ref 135–145)
TOTAL PROTEIN: 6.6 g/dL (ref 6.0–8.3)

## 2016-12-23 LAB — PSA: PSA: 0.5 ng/mL (ref 0.10–4.00)

## 2016-12-23 LAB — HEMOGLOBIN A1C: Hgb A1c MFr Bld: 7.7 % — ABNORMAL HIGH (ref 4.6–6.5)

## 2016-12-23 LAB — TSH: TSH: 1.79 u[IU]/mL (ref 0.35–4.50)

## 2016-12-27 NOTE — Assessment & Plan Note (Signed)
Patient encouraged to maintain heart healthy diet, regular exercise, adequate sleep. Consider daily probiotics. Take medications as prescribed. Labs ordered and reviewed 

## 2016-12-27 NOTE — Progress Notes (Signed)
Patient ID: Scott Adkins, male   DOB: March 09, 1961, 56 y.o.   MRN: [id number]   Subjective:    Patient ID: Scott Adkins, male    DOB: [birth year], 56 y.o.   MRN: [id number]  Chief Complaint  Patient presents with  . Annual Exam  . Hypertension  . Foot Ulcer  . Diabetes    HPI Patient is in today for preventative exam and follow up on chronic medical conditions including hypertension, diabetes, anemia and hyperlipidemia. No polyuria or polydipsia. No recent febrile illness or hospitalization. Is staying active. Tries to maintain a heart healthy diet with minimal carbohydrate intake. Denies CP/palp/SOB/HA/congestion/fevers/GI or GU c/o. Taking meds as prescribed  Past Medical History:  Diagnosis Date  . Anemia 12/22/2015  . Arthritis   . Chicken pox as a child  . Constipation 12/22/2016  . Diabetes mellitus type I (HCC)    uses insulin pump// managed by Dr. Casimiro Needle [place]  . 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 Hyperlipidemia   . Hypertension   . Injury of right rotator cuff 08/16/2012   Has been seen by [facility]  . Left flank pain 09/12/2015  . Mumps as a child  . Pain in joint, lower leg 01/24/2016  . Preventative health care 02/19/2013  . Sun-damaged skin 12/06/2015    Past Surgical History:  Procedure Laterality Date  . insulin pump     placed and removed  . REFRACTIVE SURGERY Right 2007   [facility]  . ROTATOR CUFF REPAIR  2004   left    Family History  Problem Relation Age of Onset  . Hypertension Mother   . Stroke Father   . Hypertension Father   . Hyperlipidemia Father   . Hypertension Brother   . Hyperlipidemia Brother   . Mental illness Brother        depression  . Heart disease Maternal Grandmother        MI  . Heart disease Maternal Grandfather        MI  . Hyperlipidemia Brother   . Hypertension Brother   . Colon cancer Neg Hx   . Prostate cancer Neg Hx   . Breast cancer Neg Hx   . Rectal cancer Neg Hx     . Diabetes Neg Hx     Social History   Social History  . Marital status: Married    Spouse name: N/A  . Number of children: 3  . Years of education: N/A   Occupational History  . welder    Social History Main Topics  . Smoking status: Never Smoker  . Smokeless tobacco: Never Used  . Alcohol use No  . Drug use: No  . Sexual activity: Not on file     Comment: lives with wife and mother in law, no dietary restrictions   Other Topics Concern  . Not on file   Social History Narrative   Married 14 yrs   2 daughters; 1 son   Occupation: welder          Outpatient Medications Prior to Visit  Medication Sig Dispense Refill  . aspirin 81 MG tablet Take 81 mg by mouth daily.    . Cholecalciferol (VITAMIN D3) 1000 UNITS CAPS Take 2,000 Units by mouth daily.    . Ferrous Fumarate (HEMOCYTE) 324 (106 Fe) MG TABS tablet Take 1 tablet (106 mg of iron total) by mouth daily. 30 tablet 3  . fluticasone (FLONASE) 50 MCG/ACT nasal spray Place 2  sprays into both nostrils daily. 48 g 2  . insulin detemir (LEVEMIR) 100 UNIT/ML injection Inject 0.1 mLs (10 Units total) into the skin at bedtime. 10 mL 5  . lisinopril (PRINIVIL,ZESTRIL) 20 MG tablet TAKE ONE TABLET BY MOUTH TWICE DAILY (Patient taking differently: TAKE 1/2 A TABLET BY MOUTH TWICE DAILY) 180 tablet 1  . NOVOLOG 100 UNIT/ML injection INJECT 7-20 UNITS INTO THE SKIN THREE TIMES DAILY WITH MEALS. CHANGES DOSES SEASONALLY 40 mL 4  . simvastatin (ZOCOR) 40 MG tablet Take 1 tablet (40 mg total) by mouth daily. 90 tablet 3  . doxycycline (VIBRA-TABS) 100 MG tablet Take 1 tablet (100 mg total) by mouth 2 (two) times daily. 20 tablet 0   No facility-administered medications prior to visit.     Allergies  Allergen Reactions  . Viagra [Sildenafil Citrate] Other (See Comments)    headache    Review of Systems  Constitutional: Negative for chills, fever and malaise/fatigue.  HENT: Negative for congestion.   Eyes: Negative for  blurred vision.  Respiratory: Negative for shortness of breath.   Cardiovascular: Negative for chest pain, palpitations and leg swelling.  Gastrointestinal: Negative for abdominal pain, blood in stool and nausea.  Genitourinary: Negative for dysuria and frequency.  Musculoskeletal: Negative for falls.  Skin: Negative for rash.  Neurological: Negative for dizziness, loss of consciousness and headaches.  Endo/Heme/Allergies: Negative for environmental allergies.  Psychiatric/Behavioral: Negative for depression. The patient is not nervous/anxious.        Objective:    Physical Exam  Constitutional: He is oriented to person, place, and time. He appears well-developed and well-nourished. No distress.  HENT:  Head: Normocephalic and atraumatic.  Eyes: Conjunctivae are normal.  Neck: Neck supple. No thyromegaly present.  Cardiovascular: Normal rate, regular rhythm and normal heart sounds.   No murmur heard. Pulmonary/Chest: Effort normal and breath sounds normal. No respiratory distress. He has no wheezes.  Abdominal: Soft. Bowel sounds are normal. He exhibits no mass. There is no tenderness.  Musculoskeletal: He exhibits no edema.  Lymphadenopathy:    He has no cervical adenopathy.  Neurological: He is alert and oriented to person, place, and time.  Skin: Skin is warm and dry.  Psychiatric: He has a normal mood and affect. His behavior is normal.    BP 128/80   Pulse 63   Temp 98.7 F (37.1 C) (Oral)   Ht  (1.702 m)   Wt 184 lb (83.5 kg)   SpO2 99%   BMI 28.82 kg/m  Wt Readings from Last 3 Encounters:  12/22/16 184 lb (83.5 kg)  08/27/16 180 lb 12.8 oz (82 kg)  07/09/16 176 lb 3.2 oz (79.9 kg)     Lab Results  Component Value Date   WBC 8.5 12/22/2016   HGB 11.6 (L) 12/22/2016   HCT 36.0 (L) 12/22/2016   PLT 211.0 12/22/2016   GLUCOSE 100 (H) 12/22/2016   CHOL 164 12/22/2016   TRIG 85.0 12/22/2016   HDL 70.90 12/22/2016   LDLDIRECT 71 09/10/2008   LDLCALC 76  12/22/2016   ALT 17 12/22/2016   AST 23 12/22/2016   NA 140 12/22/2016   K 4.6 12/22/2016   CL 104 12/22/2016   CREATININE 1.01 12/22/2016   BUN 28 (H) 12/22/2016   CO2 31 12/22/2016   TSH 1.79 12/22/2016   PSA 0.50 12/22/2016   HGBA1C 7.7 (H) 12/22/2016   MICROALBUR <0.7 02/15/2015    Lab Results  Component Value Date   TSH 1.79 12/22/2016  Lab Results  Component Value Date   WBC 8.5 12/22/2016   HGB 11.6 (L) 12/22/2016   HCT 36.0 (L) 12/22/2016   MCV 90.1 12/22/2016   PLT 211.0 12/22/2016   Lab Results  Component Value Date   NA 140 12/22/2016   K 4.6 12/22/2016   CO2 31 12/22/2016   GLUCOSE 100 (H) 12/22/2016   BUN 28 (H) 12/22/2016   CREATININE 1.01 12/22/2016   BILITOT 0.3 12/22/2016   ALKPHOS 53 12/22/2016   AST 23 12/22/2016   ALT 17 12/22/2016   PROT 6.6 12/22/2016   ALBUMIN 4.2 12/22/2016   CALCIUM 9.7 12/22/2016   GFR 81.07 12/22/2016   Lab Results  Component Value Date   CHOL 164 12/22/2016   Lab Results  Component Value Date   HDL 70.90 12/22/2016   Lab Results  Component Value Date   LDLCALC 76 12/22/2016   Lab Results  Component Value Date   TRIG 85.0 12/22/2016   Lab Results  Component Value Date   CHOLHDL 2 12/22/2016   Lab Results  Component Value Date   HGBA1C 7.7 (H) 12/22/2016       Assessment & Plan:   Problem List Items Addressed This Visit    Hyperlipidemia    Tolerating statin, encouraged heart healthy diet, avoid trans fats, minimize simple carbs and saturated fats. Increase exercise as tolerated      Relevant Orders   Lipid panel (Completed)   Lipid panel   Essential hypertension    Well controlled, no changes to meds. Encouraged heart healthy diet such as the DASH diet and exercise as tolerated.       Relevant Orders   CBC (Completed)   Comprehensive metabolic panel (Completed)   TSH (Completed)   CBC   TSH   Comprehensive metabolic panel   Preventative health care - Primary    Patient encouraged  to maintain heart healthy diet, regular exercise, adequate sleep. Consider daily probiotics. Take medications as prescribed. Labs ordered and reviewed      Relevant Orders   PSA (Completed)   DM (diabetes mellitus), type 1 with ophthalmic complications (HCC)    hgba1c acceptable, minimize simple carbs. Increase exercise as tolerated. Continue current meds      Relevant Orders   Hemoglobin A1c (Completed)   Hemoglobin A1c   Muscle cramping    hyland's leg cramp med      Relevant Orders   CBC (Completed)   Comprehensive metabolic panel (Completed)   Anemia    Increase leafy greens, consider increased lean red meat and using cast iron cookware. Continue to monitor, report any concerns      Relevant Orders   Fecal occult blood, imunochemical   Constipation    Try lactulose once to twice daily as needed. Encouraged increased hydration and fiber in diet. Daily probiotics. If bowels not moving can use MOM 2 tbls po in 4 oz of warm prune juice by mouth every 2-3 days. If no results then repeat in 4 hours with  Dulcolax suppository pr, may repeat again in 4 more hours as needed. Seek care if symptoms worsen. Consider daily Miralax and/or Dulcolax if symptoms persist.       Relevant Orders   CBC (Completed)   Comprehensive metabolic panel (Completed)   Fecal occult blood, imunochemical    Other Visit Diagnoses    Hyperkalemia       Relevant Orders   Comprehensive metabolic panel   Rectal bleeding       Relevant  Orders   Fecal occult blood, imunochemical      I have discontinued Mr. Hedman doxycycline. I am also having him start on clotrimazole and lactulose. Additionally, I am having him maintain his Vitamin D3, aspirin, insulin detemir, NOVOLOG, simvastatin, fluticasone, lisinopril, and Ferrous Fumarate.  Meds ordered this encounter  Medications  . clotrimazole (CLOTRIMAZOLE ANTI-FUNGAL) 1 % cream    Sig: Apply 1 application topically 2 (two) times daily.    Dispense:  113 g     Refill:  03  . lactulose (CHRONULAC) 10 GM/15ML solution    Sig: Take 15 mLs (10 g total) by mouth 2 (two) times daily as needed for mild constipation.    Dispense:  946 mL    Refill:  5     Danise Edge, MD

## 2017-05-06 ENCOUNTER — Other Ambulatory Visit: Payer: Self-pay | Admitting: Family Medicine

## 2017-05-17 ENCOUNTER — Encounter: Payer: Self-pay | Admitting: Family

## 2017-05-17 ENCOUNTER — Ambulatory Visit (HOSPITAL_BASED_OUTPATIENT_CLINIC_OR_DEPARTMENT_OTHER)
Admission: RE | Admit: 2017-05-17 | Discharge: 2017-05-17 | Disposition: A | Discharge status: Home / Self Care | Payer: PRIVATE HEALTH INSURANCE | Source: Ambulatory Visit | Attending: Family | Admitting: Family

## 2017-05-17 ENCOUNTER — Ambulatory Visit (INDEPENDENT_AMBULATORY_CARE_PROVIDER_SITE_OTHER): Payer: PRIVATE HEALTH INSURANCE | Admitting: Family

## 2017-05-17 VITALS — BP 141/71 | HR 69 | Temp 98.9°F | Resp 18 | Ht 68.0 in | Wt 187.8 lb

## 2017-05-17 DIAGNOSIS — R5383 Other fatigue: Secondary | ICD-10-CM

## 2017-05-17 DIAGNOSIS — R52 Pain, unspecified: Secondary | ICD-10-CM | POA: Diagnosis not present

## 2017-05-17 DIAGNOSIS — R0602 Shortness of breath: Secondary | ICD-10-CM

## 2017-05-17 DIAGNOSIS — R0989 Other specified symptoms and signs involving the circulatory and respiratory systems: Secondary | ICD-10-CM

## 2017-05-17 LAB — POCT INFLUENZA A/B
INFLUENZA B, POC: NEGATIVE
Influenza A, POC: NEGATIVE

## 2017-05-17 LAB — D-DIMER, QUANTITATIVE (NOT AT ARMC): D DIMER QUANT: 0.68 ug{FEU}/mL — AB (ref <0.50)

## 2017-05-17 NOTE — Progress Notes (Signed)
Subjective:    Patient ID: Scott Adkins, male    DOB: 23-Feb-1961, 57 y.o.   MRN: [id number]  HPI  Mr. Chevez is a 57 yr old male who presents today with c/o a "cold."  Reports that he has SOB and fatigue with walking. That is unusual for him as he walks on average 9-10 miles a day. Symptoms began on Friday with nasal congestion but now he has chest congestion. Denies sore throat or cough. Denies fever or hx of asthma. He has tried otc mucinex with some improvement in his symptoms. Denies cp/swelling, abdominal pain,  Nausea or vomiting.   Review of Systems See HPI  Past Medical History:  Diagnosis Date  . Anemia 12/22/2015  . Arthritis   . Chicken pox as a child  . Constipation 12/22/2016  . Diabetes mellitus type I (HCC)    uses insulin pump// managed by Dr. Casimiro Needle Altheimer  . 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 Hyperlipidemia   . Hypertension   . Injury of right rotator cuff 08/16/2012   Has been seen by [facility]  . Left flank pain 09/12/2015  . Mumps as a child  . Pain in joint, lower leg 01/24/2016  . Preventative health care 02/19/2013  . Sun-damaged skin 12/06/2015     Social History   Socioeconomic History  . Marital status: Married    Spouse name: Not on file  . Number of children: 3  . Years of education: Not on file  . Highest education level: Not on file  Social Needs  . Financial resource strain: Not on file  . Food insecurity - worry: Not on file  . Food insecurity - inability: Not on file  . Transportation needs - medical: Not on file  . Transportation needs - non-medical: Not on file  Occupational History  . Occupation: welder  Tobacco Use  . Smoking status: Never Smoker  . Smokeless tobacco: Never Used  Substance and Sexual Activity  . Alcohol use: No    Alcohol/week: 0.6 oz    Types: 1 Shots of liquor per week  . Drug use: No  . Sexual activity: Not on file    Comment: lives with wife and mother in law, no  dietary restrictions  Other Topics Concern  . Not on file  Social History Narrative   Married 14 yrs   2 daughters; 1 son   Occupation: Psychologist, occupational       Past Surgical History:  Procedure Laterality Date  . insulin pump     placed and removed  . REFRACTIVE SURGERY Right 2007   [facility]  . ROTATOR CUFF REPAIR  2004   left    Family History  Problem Relation Age of Onset  . Hypertension Mother   . Stroke Father   . Hypertension Father   . Hyperlipidemia Father   . Hypertension Brother   . Hyperlipidemia Brother   . Mental illness Brother        depression  . Heart disease Maternal Grandmother        MI  . Heart disease Maternal Grandfather        MI  . Hyperlipidemia Brother   . Hypertension Brother   . Colon cancer Neg Hx   . Prostate cancer Neg Hx   . Breast cancer Neg Hx   . Rectal cancer Neg Hx   . Diabetes Neg Hx     Allergies  Allergen Reactions  . Viagra [  Sildenafil Citrate] Other (See Comments)    headache    Current Outpatient Medications on File Prior to Visit  Medication Sig Dispense Refill  . aspirin 81 MG tablet Take 81 mg by mouth daily.    . Cholecalciferol (VITAMIN D3) 1000 UNITS CAPS Take 2,000 Units by mouth daily.    . clotrimazole (CLOTRIMAZOLE ANTI-FUNGAL) 1 % cream Apply 1 application topically 2 (two) times daily. 113 g 03  . Ferrous Fumarate (HEMOCYTE) 324 (106 Fe) MG TABS tablet Take 1 tablet (106 mg of iron total) by mouth daily. 30 tablet 3  . fluticasone (FLONASE) 50 MCG/ACT nasal spray Place 2 sprays into both nostrils daily. 48 g 2  . insulin detemir (LEVEMIR) 100 UNIT/ML injection Inject 0.1 mLs (10 Units total) into the skin at bedtime. 10 mL 5  . lactulose (CHRONULAC) 10 GM/15ML solution Take 15 mLs (10 g total) by mouth 2 (two) times daily as needed for mild constipation. 946 mL 5  . lisinopril (PRINIVIL,ZESTRIL) 20 MG tablet TAKE ONE TABLET BY MOUTH TWICE DAILY (Patient taking differently: TAKE 1/2 A TABLET BY MOUTH TWICE  DAILY) 180 tablet 1  . NOVOLOG 100 UNIT/ML injection INJECT 7-20 UNITS INTO THE SKIN THREE TIMES DAILY WITH MEALS. CHANGES DOSES SEASONALLY 40 mL 4  . simvastatin (ZOCOR) 40 MG tablet TAKE ONE TABLET BY MOUTH ONCE DAILY 90 tablet 3   No current facility-administered medications on file prior to visit.     BP (!) 141/71 (BP Location: Right Arm, Patient Position: Sitting, Cuff Size: Large)   Pulse 69   Temp 98.9 F (37.2 C) (Oral)   Resp 18   Ht 5\' 8"  (1.727 m)   Wt 187 lb 12.8 oz (85.2 kg)   SpO2 98%   BMI 28.55 kg/m       Objective:   Physical Exam  Constitutional: He is oriented to person, place, and time. He appears well-developed and well-nourished. No distress.  HENT:  Head: Normocephalic and atraumatic.  Right Ear: Tympanic membrane and ear canal normal.  Left Ear: Tympanic membrane and ear canal normal.  Mouth/Throat: No oropharyngeal exudate, posterior oropharyngeal edema, posterior oropharyngeal erythema or tonsillar abscesses.  Cardiovascular: Normal rate and regular rhythm.  No murmur heard. Pulmonary/Chest: Effort normal and breath sounds normal. No respiratory distress. He has no wheezes. He has no rales.  Musculoskeletal: He exhibits no edema.  Neurological: He is alert and oriented to person, place, and time.  Skin: Skin is warm and dry.  Psychiatric: He has a normal mood and affect. His behavior is normal. Thought content normal.          Assessment & Plan:  Fatigue/SOB- rapid flu is negative.  Exam WNL, vitals normal. Check cbc, cmet, bnp,  Check CXR to rule out PNA, D dimer to rule out PE.

## 2017-05-17 NOTE — Patient Instructions (Signed)
Please complete lab work prior to leaving. Complete x ray on the first floor. Call if symptoms worsen or if not improved in 2-3 days. Go to ER If you develop severe shortness of breath or chest pain.

## 2017-05-18 ENCOUNTER — Telehealth: Payer: Self-pay | Admitting: Family

## 2017-05-18 ENCOUNTER — Ambulatory Visit (HOSPITAL_BASED_OUTPATIENT_CLINIC_OR_DEPARTMENT_OTHER)
Admission: RE | Admit: 2017-05-18 | Discharge: 2017-05-18 | Disposition: A | Discharge status: Home / Self Care | Payer: PRIVATE HEALTH INSURANCE | Source: Ambulatory Visit | Attending: Family | Admitting: Family

## 2017-05-18 DIAGNOSIS — I700 Atherosclerosis of aorta: Secondary | ICD-10-CM | POA: Diagnosis not present

## 2017-05-18 DIAGNOSIS — R7989 Other specified abnormal findings of blood chemistry: Secondary | ICD-10-CM

## 2017-05-18 DIAGNOSIS — E875 Hyperkalemia: Secondary | ICD-10-CM

## 2017-05-18 DIAGNOSIS — I2510 Atherosclerotic heart disease of native coronary artery without angina pectoris: Secondary | ICD-10-CM | POA: Insufficient documentation

## 2017-05-18 DIAGNOSIS — K449 Diaphragmatic hernia without obstruction or gangrene: Secondary | ICD-10-CM | POA: Insufficient documentation

## 2017-05-18 DIAGNOSIS — J9811 Atelectasis: Secondary | ICD-10-CM | POA: Diagnosis not present

## 2017-05-18 LAB — BRAIN NATRIURETIC PEPTIDE: Brain Natriuretic Peptide: 21 pg/mL (ref <100)

## 2017-05-18 LAB — COMPREHENSIVE METABOLIC PANEL
ALBUMIN: 3.8 g/dL (ref 3.5–5.2)
ALT: 16 U/L (ref 0–53)
AST: 16 U/L (ref 0–37)
Alkaline Phosphatase: 60 U/L (ref 39–117)
BUN: 16 mg/dL (ref 6–23)
CHLORIDE: 100 meq/L (ref 96–112)
CO2: 33 mEq/L — ABNORMAL HIGH (ref 19–32)
CREATININE: 0.99 mg/dL (ref 0.40–1.50)
Calcium: 8.9 mg/dL (ref 8.4–10.5)
GFR: 82.85 mL/min (ref >60.00)
GLUCOSE: 352 mg/dL — AB (ref 70–99)
Potassium: 5.8 mEq/L — ABNORMAL HIGH (ref 3.5–5.1)
SODIUM: 135 meq/L (ref 135–145)
TOTAL PROTEIN: 6.5 g/dL (ref 6.0–8.3)
Total Bilirubin: 0.9 mg/dL (ref 0.2–1.2)

## 2017-05-18 LAB — CBC WITH DIFFERENTIAL/PLATELET
BASOS ABS: 0 10*3/uL (ref 0.0–0.1)
Basophils Relative: 0.6 % (ref 0.0–3.0)
EOS ABS: 0.2 10*3/uL (ref 0.0–0.7)
Eosinophils Relative: 2.4 % (ref 0.0–5.0)
HEMATOCRIT: 40.4 % (ref 39.0–52.0)
Hemoglobin: 13.3 g/dL (ref 13.0–17.0)
LYMPHS PCT: 13.8 % (ref 12.0–46.0)
Lymphs Abs: 1 10*3/uL (ref 0.7–4.0)
MCHC: 32.9 g/dL (ref 30.0–36.0)
MCV: 88.7 fl (ref 78.0–100.0)
Monocytes Absolute: 1.1 10*3/uL — ABNORMAL HIGH (ref 0.1–1.0)
Monocytes Relative: 14.2 % — ABNORMAL HIGH (ref 3.0–12.0)
Neutro Abs: 5.2 10*3/uL (ref 1.4–7.7)
Neutrophils Relative %: 69 % (ref 43.0–77.0)
Platelets: 179 10*3/uL (ref 150.0–400.0)
RBC: 4.56 Mil/uL (ref 4.22–5.81)
RDW: 13 % (ref 11.5–15.5)
WBC: 7.5 10*3/uL (ref 4.0–10.5)

## 2017-05-18 MED ORDER — AMLODIPINE BESYLATE 5 MG PO TABS: 5.0000 mg | ORAL_TABLET | Freq: Every day | ORAL | 3 refills | Status: DC

## 2017-05-18 MED ORDER — ATORVASTATIN CALCIUM 20 MG PO TABS: 20.0000 mg | ORAL_TABLET | Freq: Every day | ORAL | 3 refills | Status: DC

## 2017-05-18 MED ORDER — IOPAMIDOL (ISOVUE-370) INJECTION 76%
100.0000 mL | Freq: Once | INTRAVENOUS | Status: AC | PRN
  Administered 2017-05-18: 100 mL via INTRAVENOUS

## 2017-05-18 NOTE — Addendum Note (Signed)
Addended by: Harley AltoPRICE, KRISTY M on: 05/18/2017 10:25 AM   Modules accepted: Orders

## 2017-05-18 NOTE — Telephone Encounter (Signed)
Scott Adkins-- pt asked about CTA result when he came up to discuss below recommendations. I told him per radiologist summary, no blood clot was seen in the lung.  Please advise any other result / recommendation?

## 2017-05-18 NOTE — Telephone Encounter (Signed)
His potassium was elevated. Not sure if specimen was hemolyzed. Let's have him repeat bmet after his CT today please dx is hyperkalemia.

## 2017-05-18 NOTE — Telephone Encounter (Signed)
Notified pt and he voices understanding. 

## 2017-05-18 NOTE — Telephone Encounter (Signed)
Agree negative for PE. Thanks I to remain off kactulose as long as he stops lisinopril.

## 2017-05-18 NOTE — Telephone Encounter (Signed)
I reviewed with Dr. Abner GreenspanBlyth. Will d/c lisinopril, have him start amlodipine 5mg  once daily for blood pressure. Repeat bmet in 1 week when he follows back up in the office.  We will also recheck his BP when he comes back. There is a possible drug interaction between amlodipine and simvastatin. D/c simvastatin and start atorvastatin instead.   No need to repeat bmet today.

## 2017-05-18 NOTE — Telephone Encounter (Signed)
Notified pt and he voices understanding. He is agreeable to below medication changes. Appointment has been scheduled for 05/25/17 at 12:40pm.  Pt asks for work note for yesterday and today. Work note given.

## 2017-05-18 NOTE — Telephone Encounter (Signed)
Pt having CT now. Left message with radiology intake to have pt come upstairs and ask for me when he is done to discuss below recommendations.

## 2017-05-18 NOTE — Telephone Encounter (Signed)
D dimer is elevated.  Had cmet drawn yesterday which should result shortly.  Needs CTA chest today. I have placed order.

## 2017-05-18 NOTE — Telephone Encounter (Signed)
Notified pt and he voices understanding. He is diabetic and will need BUN / Creat before they will schedule the CTA. I advised pt we would call him back as soon as the result is available today and given him appt time. Precert is being obtained.

## 2017-05-18 NOTE — Telephone Encounter (Signed)
Notified pt. He states he has a history of elevated potassium and has been taking lactulose per his PCP

## 2017-05-18 NOTE — Addendum Note (Signed)
Addended by: Mervin KungFERGERSON, Karie Skowron A on: 05/18/2017 03:57 PM   Modules accepted: Orders

## 2017-05-21 ENCOUNTER — Ambulatory Visit: Payer: PRIVATE HEALTH INSURANCE | Admitting: Family

## 2017-05-21 ENCOUNTER — Encounter: Payer: Self-pay | Admitting: Family

## 2017-05-21 ENCOUNTER — Ambulatory Visit: Payer: Self-pay | Admitting: *Deleted

## 2017-05-21 VITALS — BP 170/84 | HR 69 | Temp 97.8°F | Resp 18 | Ht 68.0 in | Wt 189.6 lb

## 2017-05-21 DIAGNOSIS — I10 Essential (primary) hypertension: Secondary | ICD-10-CM

## 2017-05-21 DIAGNOSIS — E875 Hyperkalemia: Secondary | ICD-10-CM

## 2017-05-21 MED ORDER — METOPROLOL SUCCINATE ER 50 MG PO TB24
50.0000 mg | ORAL_TABLET | Freq: Every day | ORAL | 2 refills | Status: DC
Start: 2017-05-21 — End: 2017-08-19

## 2017-05-21 NOTE — Telephone Encounter (Signed)
Spoke with pt and scheduled appt for pt to see Peggyann Juba'Sullivan, NP at 9am today.

## 2017-05-21 NOTE — Patient Instructions (Addendum)
Please stop amlodipine, start toprol xl for blood pressure.  Follow up on 2/19 as scheduled.

## 2017-05-21 NOTE — Progress Notes (Signed)
Subjective:    Patient ID: Scott Adkins, male    DOB: May 05, 1960, 57 y.o.   MRN: [id number]  HPI  Scott Adkins is a 57 year old male who presents today with chief complaint of dizziness.  We saw him on May 17, 2017.  On that day he described shortness of breath and fatigue with walking.  He also had some nasal congestion and chest congestion.  Flu swab was negative.  Chest x-ray was clear.  His d-dimer was elevated prompting us to do a CT angiogram of the chest.  CT angio was negative for PE.  His complete metabolic panel was significant for hyperkalemia which was not new for him.  Potassium was 5.8.  His sugar was also elevated at 352.  Due to his hyperkalemia on ACE inhibitor his lisinopril was discontinued.  He was instead placed on amlodipine.  He reports that he got light headed at work.  Little bit on Wednesday and more so today. Reports that he has had anorexia since he started amlodipine.  He reports + HA which is unusual. Reports that fatigue/sob is resolved. Still has some congestion but it is improved.   Reports that his AM sugar was 280. This was because he ate french toast.    Review of Systems See HPI  Past Medical History:  Diagnosis Date  . Anemia 12/22/2015  . Arthritis   . Chicken pox as a child  . Constipation 12/22/2016  . Diabetes mellitus type I (HCC)    uses insulin pump// managed by Dr. Casimiro NeedleMichael Altheimer  . 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 Hyperlipidemia   . Hypertension   . Injury of right rotator cuff 08/16/2012   Has been seen by [facility]  . Left flank pain 09/12/2015  . Mumps as a child  . Pain in joint, lower leg 01/24/2016  . Preventative health care 02/19/2013  . Sun-damaged skin 12/06/2015     Social History   Socioeconomic History  . Marital status: Married    Spouse name: Not on file  . Number of children: 3  . Years of education: Not on file  . Highest education level: Not on file  Social Needs    . Financial resource strain: Not on file  . Food insecurity - worry: Not on file  . Food insecurity - inability: Not on file  . Transportation needs - medical: Not on file  . Transportation needs - non-medical: Not on file  Occupational History  . Occupation: welder  Tobacco Use  . Smoking status: Never Smoker  . Smokeless tobacco: Never Used  Substance and Sexual Activity  . Alcohol use: No    Alcohol/week: 0.6 oz    Types: 1 Shots of liquor per week  . Drug use: No  . Sexual activity: Not on file    Comment: lives with wife and mother in law, no dietary restrictions  Other Topics Concern  . Not on file  Social History Narrative   Married 14 yrs   2 daughters; 1 son   Occupation: Psychologist, occupationalwelder       Past Surgical History:  Procedure Laterality Date  . insulin pump     placed and removed  . REFRACTIVE SURGERY Right 2007   [facility]  . ROTATOR CUFF REPAIR  2004   left    Family History  Problem Relation Age of Onset  . Hypertension Mother   . Stroke Father   . Hypertension Father   .  Hyperlipidemia Father   . Hypertension Brother   . Hyperlipidemia Brother   . Mental illness Brother        depression  . Heart disease Maternal Grandmother        MI  . Heart disease Maternal Grandfather        MI  . Hyperlipidemia Brother   . Hypertension Brother   . Colon cancer Neg Hx   . Prostate cancer Neg Hx   . Breast cancer Neg Hx   . Rectal cancer Neg Hx   . Diabetes Neg Hx     Allergies  Allergen Reactions  . Lisinopril     Hyperkalemia   . Viagra [Sildenafil Citrate] Other (See Comments)    headache    Current Outpatient Medications on File Prior to Visit  Medication Sig Dispense Refill  . aspirin 81 MG tablet Take 81 mg by mouth daily.    Marland Kitchen atorvastatin (LIPITOR) 20 MG tablet Take 1 tablet (20 mg total) by mouth daily. 30 tablet 3  . Cholecalciferol (VITAMIN D3) 1000 UNITS CAPS Take 2,000 Units by mouth daily.    . clotrimazole (CLOTRIMAZOLE ANTI-FUNGAL)  1 % cream Apply 1 application topically 2 (two) times daily. 113 g 03  . Ferrous Fumarate (HEMOCYTE) 324 (106 Fe) MG TABS tablet Take 1 tablet (106 mg of iron total) by mouth daily. 30 tablet 3  . fluticasone (FLONASE) 50 MCG/ACT nasal spray Place 2 sprays into both nostrils daily. 48 g 2  . insulin detemir (LEVEMIR) 100 UNIT/ML injection Inject 0.1 mLs (10 Units total) into the skin at bedtime. 10 mL 5  . NOVOLOG 100 UNIT/ML injection INJECT 7-20 UNITS INTO THE SKIN THREE TIMES DAILY WITH MEALS. CHANGES DOSES SEASONALLY 40 mL 4   No current facility-administered medications on file prior to visit.     BP (!) 170/84 (BP Location: Right Arm, Cuff Size: Large)   Pulse 69   Temp 97.8 F (36.6 C) (Oral)   Resp 18   Ht 5\' 8"  (1.727 m)   Wt 189 lb 9.6 oz (86 kg)   SpO2 100%   BMI 28.83 kg/m       Objective:   Physical Exam  Constitutional: He is oriented to person, place, and time. He appears well-developed and well-nourished. No distress.  HENT:  Head: Normocephalic and atraumatic.  Cardiovascular: Normal rate and regular rhythm.  No murmur heard. Pulmonary/Chest: Effort normal and breath sounds normal. No respiratory distress. He has no wheezes. He has no rales.  Musculoskeletal: He exhibits no edema.  Neurological: He is alert and oriented to person, place, and time.  Skin: Skin is warm and dry.  Psychiatric: He has a normal mood and affect. His behavior is normal. Thought content normal.          Assessment & Plan:  HTN-orthostatics are checked today.  Follow-up blood pressure is 140/78 lying, 150/76 sitting and 150/80 standing.  While it is certainly possible that his dizziness, anorexia, and headache are related to amlodipine, it is also possible that this is related to his resolving viral illness.  We will discontinue his amlodipine today.  In place I will begin him on Toprol-XL 50 mg once daily.  Hyperkalemia-he remains off of lactulose.  He has follow-up scheduled on  2/19 we will plan to repeat his blood pressure as well as his potassium that day.

## 2017-05-21 NOTE — Telephone Encounter (Signed)
Called in c/o being light headed and "unable to eat" after starting a new BP medication Dr. Peggyann Juba'Sullivan prescribed for him on Wednesday.   He is drinking about 32 ounces of water a day.   He is only eating maybe 1/2 a sandwich for lunch which is very unusual for him.   "I usually have a very good appetite". " By the time I get home from work I'm starving and want to eat everything".  I have routed a high priority note to Dr. Arvil Chaco'Sullivan's nurse pool making her aware of the situation.    I let him know someone from the office should be getting back to him today.  He verbalized understanding.    He is going to go home from work due to the light headedness just to "be safe".   "I'm ok to drive home"   "I'm mostly light headed when I first stand up from sitting". Reason for Disposition . Caller has NON-URGENT medication question about med that PCP prescribed and triager unable to answer question  Answer Assessment - Initial Assessment Questions 1. SYMPTOMS: "Do you have any symptoms?"     Light headed from my BP medication.   I started it on Wednesday of this week.   I can't eat.   I can't eat my breakfast.    I just don't feel like eating.    2. SEVERITY: If symptoms are present, ask "Are they mild, moderate or severe?"    I get light headed after taking the medication.   I'm drinking about 32 ounzes  a day.    I can eat but I just don't feel like it.     This all started after taking the BP medication on Wednesday.  Protocols used: MEDICATION QUESTION CALL-A-AH

## 2017-05-25 ENCOUNTER — Ambulatory Visit: Payer: PRIVATE HEALTH INSURANCE | Admitting: Family

## 2017-05-25 ENCOUNTER — Encounter: Payer: Self-pay | Admitting: Family

## 2017-05-25 VITALS — BP 142/64 | HR 64 | Resp 16 | Ht 68.0 in | Wt 191.4 lb

## 2017-05-25 DIAGNOSIS — E875 Hyperkalemia: Secondary | ICD-10-CM | POA: Diagnosis not present

## 2017-05-25 DIAGNOSIS — I10 Essential (primary) hypertension: Secondary | ICD-10-CM | POA: Diagnosis not present

## 2017-05-25 NOTE — Patient Instructions (Addendum)
Please complete lab work prior to leaving. Continue current medications.  Follow up as scheduled with Dr. Abner GreenspanBlyth.

## 2017-05-25 NOTE — Progress Notes (Signed)
Subjective:    Patient ID: Scott Adkins, male    DOB: 08-Aug-1960, 57 y.o.   MRN: [id number]  HPI  HTN- Last visit we stopped amlodipine due to possible side effects and had him start toprol xl.  Pt reports feeling well. No longer having dizziness, light headedness, fatigue or HA.   BP Readings from Last 3 Encounters:  05/25/17 (!) 142/64  05/21/17 (!) 170/84  05/17/17 (!) 141/71   Hyperkalemia- recently d/c'd his ACE inhibitor.    Review of Systems See HPI  Past Medical History:  Diagnosis Date  . Anemia 12/22/2015  . Arthritis   . Chicken pox as a child  . Constipation 12/22/2016  . Diabetes mellitus type I (HCC)    uses insulin pump// managed by Dr. Casimiro NeedleMichael Altheimer  . 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 Hyperlipidemia   . Hypertension   . Injury of right rotator cuff 08/16/2012   Has been seen by [facility]  . Left flank pain 09/12/2015  . Mumps as a child  . Pain in joint, lower leg 01/24/2016  . Preventative health care 02/19/2013  . Sun-damaged skin 12/06/2015     Social History   Socioeconomic History  . Marital status: Married    Spouse name: Not on file  . Number of children: 3  . Years of education: Not on file  . Highest education level: Not on file  Social Needs  . Financial resource strain: Not on file  . Food insecurity - worry: Not on file  . Food insecurity - inability: Not on file  . Transportation needs - medical: Not on file  . Transportation needs - non-medical: Not on file  Occupational History  . Occupation: welder  Tobacco Use  . Smoking status: Never Smoker  . Smokeless tobacco: Never Used  Substance and Sexual Activity  . Alcohol use: No    Alcohol/week: 0.6 oz    Types: 1 Shots of liquor per week  . Drug use: No  . Sexual activity: Not on file    Comment: lives with wife and mother in law, no dietary restrictions  Other Topics Concern  . Not on file  Social History Narrative   Married 14  yrs   2 daughters; 1 son   Occupation: Psychologist, occupationalwelder       Past Surgical History:  Procedure Laterality Date  . insulin pump     placed and removed  . REFRACTIVE SURGERY Right 2007   [facility]  . ROTATOR CUFF REPAIR  2004   left    Family History  Problem Relation Age of Onset  . Hypertension Mother   . Stroke Father   . Hypertension Father   . Hyperlipidemia Father   . Hypertension Brother   . Hyperlipidemia Brother   . Mental illness Brother        depression  . Heart disease Maternal Grandmother        MI  . Heart disease Maternal Grandfather        MI  . Hyperlipidemia Brother   . Hypertension Brother   . Colon cancer Neg Hx   . Prostate cancer Neg Hx   . Breast cancer Neg Hx   . Rectal cancer Neg Hx   . Diabetes Neg Hx     Allergies  Allergen Reactions  . Lisinopril     Hyperkalemia   . Viagra [Sildenafil Citrate] Other (See Comments)    headache    Current  Outpatient Medications on File Prior to Visit  Medication Sig Dispense Refill  . aspirin 81 MG tablet Take 81 mg by mouth daily.    Marland Kitchen atorvastatin (LIPITOR) 20 MG tablet Take 1 tablet (20 mg total) by mouth daily. 30 tablet 3  . Cholecalciferol (VITAMIN D3) 1000 UNITS CAPS Take 2,000 Units by mouth daily.    . clotrimazole (CLOTRIMAZOLE ANTI-FUNGAL) 1 % cream Apply 1 application topically 2 (two) times daily. 113 g 03  . Ferrous Fumarate (HEMOCYTE) 324 (106 Fe) MG TABS tablet Take 1 tablet (106 mg of iron total) by mouth daily. 30 tablet 3  . fluticasone (FLONASE) 50 MCG/ACT nasal spray Place 2 sprays into both nostrils daily. 48 g 2  . insulin detemir (LEVEMIR) 100 UNIT/ML injection Inject 0.1 mLs (10 Units total) into the skin at bedtime. 10 mL 5  . metoprolol succinate (TOPROL XL) 50 MG 24 hr tablet Take 1 tablet (50 mg total) by mouth daily. Take with or immediately following a meal. 30 tablet 2  . NOVOLOG 100 UNIT/ML injection INJECT 7-20 UNITS INTO THE SKIN THREE TIMES DAILY WITH MEALS. CHANGES  DOSES SEASONALLY 40 mL 4   No current facility-administered medications on file prior to visit.     BP (!) 142/64 (BP Location: Right Arm, Patient Position: Sitting, Cuff Size: Large)   Pulse 64   Resp 16   Ht 5\' 8"  (1.727 m)   Wt 191 lb 6.4 oz (86.8 kg)   SpO2 99%   BMI 29.10 kg/m       Objective:   Physical Exam  Constitutional: He is oriented to person, place, and time. He appears well-developed and well-nourished. No distress.  HENT:  Head: Normocephalic and atraumatic.  Cardiovascular: Normal rate and regular rhythm.  No murmur heard. Pulmonary/Chest: Effort normal and breath sounds normal. No respiratory distress. He has no wheezes. He has no rales.  Musculoskeletal: He exhibits no edema.  Neurological: He is alert and oriented to person, place, and time.  Skin: Skin is warm and dry.  Psychiatric: He has a normal mood and affect. His behavior is normal. Thought content normal.          Assessment & Plan:  HTN- BP is improved. Continue Toprol xl.  Hyperkalemia- obtain follow up bmet today. Hopefully K+ will come down off of ACE inhibitor.

## 2017-05-25 NOTE — Addendum Note (Signed)
Addended by: Verdie ShireBAYNES, ANGELA M on: 05/25/2017 02:24 PM   Modules accepted: Orders

## 2017-05-26 ENCOUNTER — Encounter: Payer: Self-pay | Admitting: Family

## 2017-05-26 LAB — BASIC METABOLIC PANEL
BUN: 24 mg/dL — ABNORMAL HIGH (ref 6–23)
CHLORIDE: 102 meq/L (ref 96–112)
CO2: 32 meq/L (ref 19–32)
CREATININE: 1.06 mg/dL (ref 0.40–1.50)
Calcium: 9.6 mg/dL (ref 8.4–10.5)
GFR: 76.56 mL/min (ref >60.00)
Glucose, Bld: 214 mg/dL — ABNORMAL HIGH (ref 70–99)
POTASSIUM: 5.1 meq/L (ref 3.5–5.1)
SODIUM: 140 meq/L (ref 135–145)

## 2017-06-03 ENCOUNTER — Other Ambulatory Visit: Payer: Self-pay | Admitting: Family Medicine

## 2017-06-15 ENCOUNTER — Other Ambulatory Visit (INDEPENDENT_AMBULATORY_CARE_PROVIDER_SITE_OTHER): Payer: PRIVATE HEALTH INSURANCE

## 2017-06-15 DIAGNOSIS — I10 Essential (primary) hypertension: Secondary | ICD-10-CM | POA: Diagnosis not present

## 2017-06-15 DIAGNOSIS — E782 Mixed hyperlipidemia: Secondary | ICD-10-CM | POA: Diagnosis not present

## 2017-06-15 DIAGNOSIS — E103292 Type 1 diabetes mellitus with mild nonproliferative diabetic retinopathy without macular edema, left eye: Secondary | ICD-10-CM

## 2017-06-16 ENCOUNTER — Telehealth: Payer: Self-pay | Admitting: *Deleted

## 2017-06-16 LAB — COMPREHENSIVE METABOLIC PANEL
ALK PHOS: 54 U/L (ref 39–117)
ALT: 18 U/L (ref 0–53)
AST: 19 U/L (ref 0–37)
Albumin: 4.1 g/dL (ref 3.5–5.2)
BUN: 19 mg/dL (ref 6–23)
CO2: 31 mEq/L (ref 19–32)
CREATININE: 1.03 mg/dL (ref 0.40–1.50)
Calcium: 9.8 mg/dL (ref 8.4–10.5)
Chloride: 99 mEq/L (ref 96–112)
GFR: 79.12 mL/min (ref >60.00)
GLUCOSE: 228 mg/dL — AB (ref 70–99)
POTASSIUM: 4.8 meq/L (ref 3.5–5.1)
SODIUM: 137 meq/L (ref 135–145)
TOTAL PROTEIN: 6.7 g/dL (ref 6.0–8.3)
Total Bilirubin: 0.4 mg/dL (ref 0.2–1.2)

## 2017-06-16 LAB — CBC
HEMATOCRIT: 38.3 % — AB (ref 39.0–52.0)
HEMOGLOBIN: 12.5 g/dL — AB (ref 13.0–17.0)
MCHC: 32.6 g/dL (ref 30.0–36.0)
MCV: 89.6 fl (ref 78.0–100.0)
Platelets: 173 10*3/uL (ref 150.0–400.0)
RBC: 4.28 Mil/uL (ref 4.22–5.81)
RDW: 13.3 % (ref 11.5–15.5)
WBC: 7.8 10*3/uL (ref 4.0–10.5)

## 2017-06-16 LAB — LIPID PANEL
CHOL/HDL RATIO: 3
Cholesterol: 153 mg/dL (ref 0–200)
HDL: 56.1 mg/dL (ref >39.00)
LDL Cholesterol: 75 mg/dL (ref 0–99)
NONHDL: 97.21
Triglycerides: 109 mg/dL (ref 0.0–149.0)
VLDL: 21.8 mg/dL (ref 0.0–40.0)

## 2017-06-16 LAB — HEMOGLOBIN A1C: Hgb A1c MFr Bld: 6.8 % — ABNORMAL HIGH (ref 4.6–6.5)

## 2017-06-16 LAB — TSH: TSH: 3.21 u[IU]/mL (ref 0.35–4.50)

## 2017-06-16 NOTE — Telephone Encounter (Signed)
Received Medical Management request from [employer] for patient's Insurance [facility][Med Cost, Member ID: [id number], Group: [employer]], pertaining to a CT Angiography Chest done on 05/18/17, "in order to complete the certification process"; forwarded to provider with copy of Insurance card and CT Angiography results/SLS 03/13

## 2017-06-22 ENCOUNTER — Encounter: Payer: Self-pay | Admitting: Family Medicine

## 2017-06-22 ENCOUNTER — Ambulatory Visit: Payer: PRIVATE HEALTH INSURANCE | Admitting: Family Medicine

## 2017-06-22 VITALS — BP 132/68 | HR 64 | Temp 98.0°F | Resp 18 | Wt 189.8 lb

## 2017-06-22 DIAGNOSIS — E782 Mixed hyperlipidemia: Secondary | ICD-10-CM | POA: Diagnosis not present

## 2017-06-22 DIAGNOSIS — D649 Anemia, unspecified: Secondary | ICD-10-CM

## 2017-06-22 DIAGNOSIS — E103292 Type 1 diabetes mellitus with mild nonproliferative diabetic retinopathy without macular edema, left eye: Secondary | ICD-10-CM

## 2017-06-22 DIAGNOSIS — I10 Essential (primary) hypertension: Secondary | ICD-10-CM | POA: Diagnosis not present

## 2017-06-22 DIAGNOSIS — Z0000 Encounter for general adult medical examination without abnormal findings: Secondary | ICD-10-CM

## 2017-06-22 MED ORDER — FLUTICASONE PROPIONATE 50 MCG/ACT NA SUSP: 2.0000 | Freq: Every day | NASAL | 5 refills | Status: DC

## 2017-06-22 MED ORDER — CETIRIZINE HCL 10 MG PO TABS: 10.0000 mg | ORAL_TABLET | Freq: Every day | ORAL | 5 refills | Status: DC | PRN

## 2017-06-22 NOTE — Assessment & Plan Note (Signed)
Tolerating statin, encouraged heart healthy diet, avoid trans fats, minimize simple carbs and saturated fats. Increase exercise as tolerated 

## 2017-06-22 NOTE — Assessment & Plan Note (Signed)
Increase leafy greens, consider increased lean re. d meat and using cast iron cookware. Continue to monitor, report any concerns. hemoccult

## 2017-06-22 NOTE — Progress Notes (Signed)
Subjective:  I acted as a Neurosurgeon for Dr. Abner Greenspan. [place], [place]  Patient ID: Scott Adkins, male    DOB: 06-10-1960, 57 y.o.   MRN: [id number]  No chief complaint on file.   HPI  Patient is in today for a 6 month follow up and he reports he is doing well. He did have a respiratory illness with fever since last visit but he has recovered and denies any persistent symptoms. He reports blood sugars generally running between 70 to 115 in am although he does acknowledge his sugar was 318 this am due to a milkshake last night. No polyuria or polydipsia. Denies CP/palp/SOB/HA/congestion/fevers/GI or GU c/o. Taking meds as prescribed  Patient Care Team: Bradd Canary, MD as PCP - General (Family Medicine)   Past Medical History:  Diagnosis Date  . Anemia 12/22/2015  . Arthritis   . Chicken pox as a child  . Constipation 12/22/2016  . Diabetes mellitus type I (HCC)    uses insulin pump// managed by Dr. Casimiro Needle [place]  . 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 Hyperlipidemia   . Hypertension   . Injury of right rotator cuff 08/16/2012   Has been seen by [facility]  . Left flank pain 09/12/2015  . Mumps as a child  . Pain in joint, lower leg 01/24/2016  . Preventative health care 02/19/2013  . Sun-damaged skin 12/06/2015    Past Surgical History:  Procedure Laterality Date  . insulin pump     placed and removed  . REFRACTIVE SURGERY Right 2007   [facility]  . ROTATOR CUFF REPAIR  2004   left    Family History  Problem Relation Age of Onset  . Hypertension Mother   . Stroke Father   . Hypertension Father   . Hyperlipidemia Father   . Hypertension Brother   . Hyperlipidemia Brother   . Mental illness Brother        depression  . Heart disease Maternal Grandmother        MI  . Heart disease Maternal Grandfather        MI  . Hyperlipidemia Brother   . Hypertension Brother   . Colon cancer Neg Hx   . Prostate cancer Neg Hx   . Breast  cancer Neg Hx   . Rectal cancer Neg Hx   . Diabetes Neg Hx     Social History   Socioeconomic History  . Marital status: Married    Spouse name: Not on file  . Number of children: 3  . Years of education: Not on file  . Highest education level: Not on file  Social Needs  . Financial resource strain: Not on file  . Food insecurity - worry: Not on file  . Food insecurity - inability: Not on file  . Transportation needs - medical: Not on file  . Transportation needs - non-medical: Not on file  Occupational History  . Occupation: welder  Tobacco Use  . Smoking status: Never Smoker  . Smokeless tobacco: Never Used  Substance and Sexual Activity  . Alcohol use: No    Alcohol/week: 0.6 oz    Types: 1 Shots of liquor per week  . Drug use: No  . Sexual activity: Not on file    Comment: lives with wife and mother in law, no dietary restrictions  Other Topics Concern  . Not on file  Social History Narrative   Married 14 yrs   2 daughters;  1 son   Occupation: welder       Outpatient Medications Prior to Visit  Medication Sig Dispense Refill  . aspirin 81 MG tablet Take 81 mg by mouth daily.    Marland Kitchen atorvastatin (LIPITOR) 20 MG tablet Take 1 tablet (20 mg total) by mouth daily. 30 tablet 3  . Cholecalciferol (VITAMIN D3) 1000 UNITS CAPS Take 2,000 Units by mouth daily.    . clotrimazole (CLOTRIMAZOLE ANTI-FUNGAL) 1 % cream Apply 1 application topically 2 (two) times daily. 113 g 03  . Ferrous Fumarate (HEMOCYTE) 324 (106 Fe) MG TABS tablet Take 1 tablet (106 mg of iron total) by mouth daily. 30 tablet 3  . fluticasone (FLONASE) 50 MCG/ACT nasal spray Place 2 sprays into both nostrils daily. 48 g 2  . insulin detemir (LEVEMIR) 100 UNIT/ML injection Inject 0.1 mLs (10 Units total) into the skin at bedtime. 10 mL 5  . metoprolol succinate (TOPROL XL) 50 MG 24 hr tablet Take 1 tablet (50 mg total) by mouth daily. Take with or immediately following a meal. 30 tablet 2  . NOVOLOG 100  UNIT/ML injection INJECT 7-20 UNITS INTO THE SKIN 3 TIMES DAIY WITH MEALS 40 mL 4   No facility-administered medications prior to visit.     Allergies  Allergen Reactions  . Lisinopril     Hyperkalemia   . Viagra [Sildenafil Citrate] Other (See Comments)    headache    Review of Systems  Constitutional: Negative for fever and malaise/fatigue.  HENT: Negative for congestion.   Eyes: Negative for blurred vision.  Respiratory: Negative for shortness of breath.   Cardiovascular: Negative for chest pain, palpitations and leg swelling.  Gastrointestinal: Negative for abdominal pain, blood in stool and nausea.  Genitourinary: Negative for dysuria and frequency.  Musculoskeletal: Negative for falls.  Skin: Negative for rash.  Neurological: Negative for dizziness, loss of consciousness and headaches.  Endo/Heme/Allergies: Negative for environmental allergies.  Psychiatric/Behavioral: Negative for depression. The patient is not nervous/anxious.        Objective:    Physical Exam  Constitutional: He is oriented to person, place, and time. He appears well-developed and well-nourished. No distress.  HENT:  Head: Normocephalic and atraumatic.  Nose: Nose normal.  Eyes: Right eye exhibits no discharge. Left eye exhibits no discharge.  Neck: Normal range of motion. Neck supple.  Cardiovascular: Normal rate and regular rhythm.  No murmur heard. Pulmonary/Chest: Effort normal and breath sounds normal.  Abdominal: Soft. Bowel sounds are normal. There is no tenderness.  Musculoskeletal: He exhibits no edema.  Neurological: He is alert and oriented to person, place, and time.  Skin: Skin is warm and dry.  Psychiatric: He has a normal mood and affect.  Nursing note and vitals reviewed.   BP 132/68 (BP Location: Left Arm, Patient Position: Sitting, Cuff Size: Normal)   Pulse 64   Temp 98 F (36.7 C) (Oral)   Resp 18   Wt 189 lb 12.8 oz (86.1 kg)   SpO2 96%   BMI 28.86 kg/m  Wt  Readings from Last 3 Encounters:  06/22/17 189 lb 12.8 oz (86.1 kg)  05/25/17 191 lb 6.4 oz (86.8 kg)  05/21/17 189 lb 9.6 oz (86 kg)   BP Readings from Last 3 Encounters:  06/22/17 132/68  05/25/17 (!) 142/64  05/21/17 (!) 170/84     Immunization History  Administered Date(s) Administered  . 19-influenza Whole 12/08/2016  . Influenza Split 12/05/2012  . Influenza Whole 03/19/2008, 12/27/2009  . Influenza,inj,Quad PF,6+ Mos  02/15/2015, 01/23/2016  . Influenza-Unspecified 01/24/2014  . Pneumococcal Conjugate-13 02/15/2015  . Pneumococcal Polysaccharide-23 03/19/2008, 01/23/2016  . Tdap 08/14/2013    Health Maintenance  Topic Date Due  . OPHTHALMOLOGY EXAM  08/30/2013  . URINE MICROALBUMIN  02/15/2016  . FOOT EXAM  04/27/2017  . INFLUENZA VACCINE  12/22/2017 (Originally 11/04/2016)  . HEMOGLOBIN A1C  12/16/2017  . COLONOSCOPY  12/24/2021  . TETANUS/TDAP  08/15/2023  . PNEUMOCOCCAL POLYSACCHARIDE VACCINE  Completed  . Hepatitis C Screening  Completed  . HIV Screening  Completed    Lab Results  Component Value Date   WBC 7.8 06/15/2017   HGB 12.5 (L) 06/15/2017   HCT 38.3 (L) 06/15/2017   PLT 173.0 06/15/2017   GLUCOSE 228 (H) 06/15/2017   CHOL 153 06/15/2017   TRIG 109.0 06/15/2017   HDL 56.10 06/15/2017   LDLDIRECT 71 09/10/2008   LDLCALC 75 06/15/2017   ALT 18 06/15/2017   AST 19 06/15/2017   NA 137 06/15/2017   K 4.8 06/15/2017   CL 99 06/15/2017   CREATININE 1.03 06/15/2017   BUN 19 06/15/2017   CO2 31 06/15/2017   TSH 3.21 06/15/2017   PSA 0.50 12/22/2016   HGBA1C 6.8 (H) 06/15/2017   MICROALBUR <0.7 02/15/2015    Lab Results  Component Value Date   TSH 3.21 06/15/2017   Lab Results  Component Value Date   WBC 7.8 06/15/2017   HGB 12.5 (L) 06/15/2017   HCT 38.3 (L) 06/15/2017   MCV 89.6 06/15/2017   PLT 173.0 06/15/2017   Lab Results  Component Value Date   NA 137 06/15/2017   K 4.8 06/15/2017   CO2 31 06/15/2017   GLUCOSE 228 (H)  06/15/2017   BUN 19 06/15/2017   CREATININE 1.03 06/15/2017   BILITOT 0.4 06/15/2017   ALKPHOS 54 06/15/2017   AST 19 06/15/2017   ALT 18 06/15/2017   PROT 6.7 06/15/2017   ALBUMIN 4.1 06/15/2017   CALCIUM 9.8 06/15/2017   GFR 79.12 06/15/2017   Lab Results  Component Value Date   CHOL 153 06/15/2017   Lab Results  Component Value Date   HDL 56.10 06/15/2017   Lab Results  Component Value Date   LDLCALC 75 06/15/2017   Lab Results  Component Value Date   TRIG 109.0 06/15/2017   Lab Results  Component Value Date   CHOLHDL 3 06/15/2017   Lab Results  Component Value Date   HGBA1C 6.8 (H) 06/15/2017         Assessment & Plan:   Problem List Items Addressed This Visit    Hyperlipidemia    Tolerating statin, encouraged heart healthy diet, avoid trans fats, minimize simple carbs and saturated fats. Increase exercise as tolerated      Relevant Orders   Lipid panel   Essential hypertension    Well controlled, no changes to meds. Encouraged heart healthy diet such as the DASH diet and exercise as tolerated.       Relevant Orders   Comprehensive metabolic panel   TSH   Preventative health care - Primary   Relevant Orders   PSA   DM (diabetes mellitus), type 1 with ophthalmic complications (HCC)    hgba1c acceptable, minimize simple carbs. Increase exercise as tolerated. Continue current meds      Relevant Orders   Hemoglobin A1c   Anemia    Increase leafy greens, consider increased lean re. d meat and using cast iron cookware. Continue to monitor, report any concerns. hemoccult  Relevant Orders   CBC   Ferritin   CBC   Fecal occult blood, imunochemical([facility])      I am having Scott Leylandonald Vangorden "Ron" start on fluticasone and cetirizine. I am also having him maintain his Vitamin D3, aspirin, insulin detemir, fluticasone, Ferrous Fumarate, clotrimazole, atorvastatin, metoprolol succinate, and NOVOLOG.  Meds ordered this encounter    Medications  . fluticasone (FLONASE) 50 MCG/ACT nasal spray    Sig: Place 2 sprays into both nostrils daily.    Dispense:  16 g    Refill:  5  . cetirizine (ZYRTEC) 10 MG tablet    Sig: Take 1 tablet (10 mg total) by mouth daily as needed for allergies.    Dispense:  30 tablet    Refill:  5    CMA served as scribe during this visit. History, Physical and Plan performed by medical provider. Documentation and orders reviewed and attested to.  Danise EdgeStacey Sophi Calligan, MD

## 2017-06-22 NOTE — Assessment & Plan Note (Signed)
hgba1c acceptable, minimize simple carbs. Increase exercise as tolerated. Continue current meds 

## 2017-06-22 NOTE — Patient Instructions (Signed)
Miralax and Benefiber together once or twice a day  Cleanse with Verlee Monte Astringent for hemorrhoids Anemia Anemia is a condition in which you do not have enough red blood cells or hemoglobin. Hemoglobin is a substance in red blood cells that carries oxygen. When you do not have enough red blood cells or hemoglobin (are anemic), your body cannot get enough oxygen and your organs may not work properly. As a result, you may feel very tired or have other problems. What are the causes? Common causes of anemia include:  Excessive bleeding. Anemia can be caused by excessive bleeding inside or outside the body, including bleeding from the intestine or from periods in women.  Poor nutrition.  Long-lasting (chronic) kidney, thyroid, and liver disease.  Bone marrow disorders.  Cancer and treatments for cancer.  HIV (human immunodeficiency virus) and AIDS (acquired immunodeficiency syndrome).  Treatments for HIV and AIDS.  Spleen problems.  Blood disorders.  Infections, medicines, and autoimmune disorders that destroy red blood cells.  What are the signs or symptoms? Symptoms of this condition include:  Minor weakness.  Dizziness.  Headache.  Feeling heartbeats that are irregular or faster than normal (palpitations).  Shortness of breath, especially with exercise.  Paleness.  Cold sensitivity.  Indigestion.  Nausea.  Difficulty sleeping.  Difficulty concentrating.  Symptoms may occur suddenly or develop slowly. If your anemia is mild, you may not have symptoms. How is this diagnosed? This condition is diagnosed based on:  Blood tests.  Your medical history.  A physical exam.  Bone marrow biopsy.  Your health care provider may also check your stool (feces) for blood and may do additional testing to look for the cause of your bleeding. You may also have other tests, including:  Imaging tests, such as a CT scan or MRI.  Endoscopy.  Colonoscopy.  How is  this treated? Treatment for this condition depends on the cause. If you continue to lose a lot of blood, you may need to be treated at a hospital. Treatment may include:  Taking supplements of iron, vitamin V40, or folic acid.  Taking a hormone medicine (erythropoietin) that can help to stimulate red blood cell growth.  Having a blood transfusion. This may be needed if you lose a lot of blood.  Making changes to your diet.  Having surgery to remove your spleen.  Follow these instructions at home:  Take over-the-counter and prescription medicines only as told by your health care provider.  Take supplements only as told by your health care provider.  Follow any diet instructions that you were given.  Keep all follow-up visits as told by your health care provider. This is important. Contact a health care provider if:  You develop new bleeding anywhere in the body. Get help right away if:  You are very weak.  You are short of breath.  You have pain in your abdomen or chest.  You are dizzy or feel faint.  You have trouble concentrating.  You have bloody or black, tarry stools.  You vomit repeatedly or you vomit up blood. Summary  Anemia is a condition in which you do not have enough red blood cells or enough of a substance in your red blood cells that carries oxygen (hemoglobin).  Symptoms may occur suddenly or develop slowly.  If your anemia is mild, you may not have symptoms.  This condition is diagnosed with blood tests as well as a medical history and physical exam. Other tests may be needed.  Treatment for  this condition depends on the cause of the anemia. This information is not intended to replace advice given to you by your health care provider. Make sure you discuss any questions you have with your health care provider. Document Released: 04/30/2004 Document Revised: 04/24/2016 Document Reviewed: 04/24/2016 Elsevier Interactive Patient Education  [employer].

## 2017-06-22 NOTE — Assessment & Plan Note (Signed)
Well controlled, no changes to meds. Encouraged heart healthy diet such as the DASH diet and exercise as tolerated.  °

## 2017-08-17 ENCOUNTER — Telehealth: Payer: Self-pay | Admitting: *Deleted

## 2017-08-17 NOTE — Telephone Encounter (Signed)
Received fax from [facility] requesting refill of amlodipine. Medication was stopped in February due to side effects (anorexia, dizziness, HA). Faxed denial back to [facility].

## 2017-08-19 ENCOUNTER — Telehealth: Payer: Self-pay | Admitting: Family Medicine

## 2017-08-19 MED ORDER — METOPROLOL SUCCINATE ER 50 MG PO TB24: 50.0000 mg | ORAL_TABLET | Freq: Every day | ORAL | 3 refills | Status: DC

## 2017-08-19 MED ORDER — ATORVASTATIN CALCIUM 20 MG PO TABS: 20.0000 mg | ORAL_TABLET | Freq: Every day | ORAL | 3 refills | Status: DC

## 2017-08-19 NOTE — Telephone Encounter (Signed)
Copied from CRM [id number]. Topic: Quick Communication - Rx Refill/Question >> Aug 19, 2017  3:59 PM Mickel Baas B, NT wrote: Medication: atorvastatin (LIPITOR) 20 MG tablet    &   metoprolol succinate (TOPROL XL) 50 MG 24 hr tablet Has the patient contacted their pharmacy? Yes.   (Agent: If no, request that the patient contact the pharmacy for the refill.) Preferred Pharmacy (with phone number or street name): [id number] - [place], [place] - [address] Agent: Please be advised that RX refills may take up to 3 business days. We ask that you follow-up with your pharmacy.

## 2017-10-14 ENCOUNTER — Encounter: Payer: Self-pay | Admitting: Family

## 2017-10-14 ENCOUNTER — Ambulatory Visit: Payer: PRIVATE HEALTH INSURANCE | Admitting: Family

## 2017-10-14 VITALS — BP 156/80 | HR 64 | Temp 98.2°F | Resp 18 | Ht 68.0 in | Wt 194.6 lb

## 2017-10-14 DIAGNOSIS — E103292 Type 1 diabetes mellitus with mild nonproliferative diabetic retinopathy without macular edema, left eye: Secondary | ICD-10-CM

## 2017-10-14 DIAGNOSIS — Z0000 Encounter for general adult medical examination without abnormal findings: Secondary | ICD-10-CM

## 2017-10-14 DIAGNOSIS — M25561 Pain in right knee: Secondary | ICD-10-CM | POA: Diagnosis not present

## 2017-10-14 DIAGNOSIS — Z125 Encounter for screening for malignant neoplasm of prostate: Secondary | ICD-10-CM | POA: Diagnosis not present

## 2017-10-14 DIAGNOSIS — D649 Anemia, unspecified: Secondary | ICD-10-CM | POA: Diagnosis not present

## 2017-10-14 DIAGNOSIS — I10 Essential (primary) hypertension: Secondary | ICD-10-CM | POA: Diagnosis not present

## 2017-10-14 DIAGNOSIS — E782 Mixed hyperlipidemia: Secondary | ICD-10-CM

## 2017-10-14 NOTE — Patient Instructions (Addendum)
Restart toprol xl. You should be contacted about your referral to sports medicine. Complete lab work prior to leaving.

## 2017-10-14 NOTE — Progress Notes (Signed)
Subjective:    Patient ID: Scott Adkins, male    DOB: 1960-11-13, 57 y.o.   MRN: [id number]  HPI  Patient is a 57 yr old male who presents today with chief complaint of right sided knee pain. Reports that knee pain has been present x 3 years and recently has worsened.  Pain is constant.  HTN- notes that he has been without his BP medication "for a long time."   Review of Systems See HPI  Past Medical History:  Diagnosis Date  . Anemia 12/22/2015  . Arthritis   . Chicken pox as a child  . Constipation 12/22/2016  . Diabetes mellitus type I (HCC)    uses insulin pump// managed by Dr. Casimiro Needle [place]  . 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 Hyperlipidemia   . Hypertension   . Injury of right rotator cuff 08/16/2012   Has been seen by [facility]  . Left flank pain 09/12/2015  . Mumps as a child  . Pain in joint, lower leg 01/24/2016  . Preventative health care 02/19/2013  . Sun-damaged skin 12/06/2015     Social History   Socioeconomic History  . Marital status: Married    Spouse name: Not on file  . Number of children: 3  . Years of education: Not on file  . Highest education level: Not on file  Occupational History  . Occupation: welder  Social Needs  . Financial resource strain: Not on file  . Food insecurity:    Worry: Not on file    Inability: Not on file  . Transportation needs:    Medical: Not on file    Non-medical: Not on file  Tobacco Use  . Smoking status: Never Smoker  . Smokeless tobacco: Never Used  Substance and Sexual Activity  . Alcohol use: No    Alcohol/week: 0.6 oz    Types: 1 Shots of liquor per week  . Drug use: No  . Sexual activity: Not on file    Comment: lives with wife and mother in law, no dietary restrictions  Lifestyle  . Physical activity:    Days per week: Not on file    Minutes per session: Not on file  . Stress: Not on file  Relationships  . Social connections:    Talks on phone: Not  on file    Gets together: Not on file    Attends religious service: Not on file    Active member of club or organization: Not on file    Attends meetings of clubs or organizations: Not on file    Relationship status: Not on file  . Intimate partner violence:    Fear of current or ex partner: Not on file    Emotionally abused: Not on file    Physically abused: Not on file    Forced sexual activity: Not on file  Other Topics Concern  . Not on file  Social History Narrative   Married 14 yrs   2 daughters; 1 son   Occupation: Psychologist, occupational       Past Surgical History:  Procedure Laterality Date  . insulin pump     placed and removed  . REFRACTIVE SURGERY Right 2007   [facility]  . ROTATOR CUFF REPAIR  2004   left    Family History  Problem Relation Age of Onset  . Hypertension Mother   . Stroke Father   . Hypertension Father   . Hyperlipidemia Father   .  Hypertension Brother   . Hyperlipidemia Brother   . Mental illness Brother        depression  . Heart disease Maternal Grandmother        MI  . Heart disease Maternal Grandfather        MI  . Hyperlipidemia Brother   . Hypertension Brother   . Colon cancer Neg Hx   . Prostate cancer Neg Hx   . Breast cancer Neg Hx   . Rectal cancer Neg Hx   . Diabetes Neg Hx     Allergies  Allergen Reactions  . Lisinopril     Hyperkalemia   . Viagra [Sildenafil Citrate] Other (See Comments)    headache    Current Outpatient Medications on File Prior to Visit  Medication Sig Dispense Refill  . aspirin 81 MG tablet Take 81 mg by mouth daily.    Marland Kitchen atorvastatin (LIPITOR) 20 MG tablet Take 1 tablet (20 mg total) by mouth daily. 30 tablet 3  . cetirizine (ZYRTEC) 10 MG tablet Take 1 tablet (10 mg total) by mouth daily as needed for allergies. 30 tablet 5  . Cholecalciferol (VITAMIN D3) 1000 UNITS CAPS Take 2,000 Units by mouth daily.    . clotrimazole (CLOTRIMAZOLE ANTI-FUNGAL) 1 % cream Apply 1 application topically 2 (two)  times daily. 113 g 03  . Ferrous Fumarate (HEMOCYTE) 324 (106 Fe) MG TABS tablet Take 1 tablet (106 mg of iron total) by mouth daily. 30 tablet 3  . fluticasone (FLONASE) 50 MCG/ACT nasal spray Place 2 sprays into both nostrils daily. 48 g 2  . fluticasone (FLONASE) 50 MCG/ACT nasal spray Place 2 sprays into both nostrils daily. 16 g 5  . insulin detemir (LEVEMIR) 100 UNIT/ML injection Inject 0.1 mLs (10 Units total) into the skin at bedtime. 10 mL 5  . metoprolol succinate (TOPROL XL) 50 MG 24 hr tablet Take 1 tablet (50 mg total) by mouth daily. Take with or immediately following a meal. 30 tablet 3  . NOVOLOG 100 UNIT/ML injection INJECT 7-20 UNITS INTO THE SKIN 3 TIMES DAIY WITH MEALS 40 mL 4   No current facility-administered medications on file prior to visit.     BP (!) 156/80 (BP Location: Right Arm, Cuff Size: Large)   Pulse 64   Temp 98.2 F (36.8 C) (Oral)   Resp 18   Ht 5\' 8"  (1.727 m)   Wt 194 lb 9.6 oz (88.3 kg)   SpO2 98%   BMI 29.59 kg/m       Objective:   Physical Exam  Constitutional: He is oriented to person, place, and time. He appears well-developed and well-nourished. No distress.  HENT:  Head: Normocephalic and atraumatic.  Cardiovascular: Normal rate and regular rhythm.  No murmur heard. Pulmonary/Chest: Effort normal and breath sounds normal. No respiratory distress. He has no wheezes. He has no rales.  Musculoskeletal: He exhibits no edema.  Right knee, no swelling, no crepitus. + pain with flexion/extension  Neurological: He is alert and oriented to person, place, and time.  Skin: Skin is warm and dry.  Psychiatric: He has a normal mood and affect. His behavior is normal. Thought content normal.          Assessment & Plan:  Right knee pain-deteriorated will refer to sports medicine. Perhaps he could benefit from steroid injection.  HTN- uncontrolled. Stated he could not get toprol refill. I reviewed and we sent 30 day supply in May with 3  refills. Advised him to call  the pharmacy to request one of the refills and to restart toprol.    He did not complete labs ordered back in March by Dr. Abner GreenspanBlyth. I have advised him to return to the lab to complete these labs.  Advised follow up in 3 months with PCP.

## 2017-10-15 LAB — CBC
HEMATOCRIT: 40.5 % (ref 39.0–52.0)
HEMOGLOBIN: 13.4 g/dL (ref 13.0–17.0)
MCHC: 33 g/dL (ref 30.0–36.0)
MCV: 88.2 fl (ref 78.0–100.0)
Platelets: 186 10*3/uL (ref 150.0–400.0)
RBC: 4.59 Mil/uL (ref 4.22–5.81)
RDW: 13 % (ref 11.5–15.5)
WBC: 9.1 10*3/uL (ref 4.0–10.5)

## 2017-10-15 LAB — FERRITIN: Ferritin: 121.6 ng/mL (ref 22.0–322.0)

## 2017-10-15 LAB — COMPREHENSIVE METABOLIC PANEL
ALBUMIN: 4 g/dL (ref 3.5–5.2)
ALK PHOS: 64 U/L (ref 39–117)
ALT: 20 U/L (ref 0–53)
AST: 17 U/L (ref 0–37)
BUN: 25 mg/dL — AB (ref 6–23)
CHLORIDE: 101 meq/L (ref 96–112)
CO2: 28 mEq/L (ref 19–32)
Calcium: 9.1 mg/dL (ref 8.4–10.5)
Creatinine, Ser: 1.03 mg/dL (ref 0.40–1.50)
GFR: 79.03 mL/min (ref >60.00)
GLUCOSE: 322 mg/dL — AB (ref 70–99)
POTASSIUM: 4.5 meq/L (ref 3.5–5.1)
Sodium: 136 mEq/L (ref 135–145)
TOTAL PROTEIN: 6.4 g/dL (ref 6.0–8.3)
Total Bilirubin: 0.4 mg/dL (ref 0.2–1.2)

## 2017-10-15 LAB — LIPID PANEL
CHOLESTEROL: 213 mg/dL — AB (ref 0–200)
HDL: 57 mg/dL (ref >39.00)
LDL Cholesterol: 122 mg/dL — ABNORMAL HIGH (ref 0–99)
NonHDL: 155.91
Total CHOL/HDL Ratio: 4
Triglycerides: 169 mg/dL — ABNORMAL HIGH (ref 0.0–149.0)
VLDL: 33.8 mg/dL (ref 0.0–40.0)

## 2017-10-15 LAB — PSA: PSA: 0.38 ng/mL (ref 0.10–4.00)

## 2017-10-15 LAB — TSH: TSH: 2.07 u[IU]/mL (ref 0.35–4.50)

## 2017-10-15 LAB — HEMOGLOBIN A1C: Hgb A1c MFr Bld: 7 % — ABNORMAL HIGH (ref 4.6–6.5)

## 2017-10-18 ENCOUNTER — Other Ambulatory Visit: Payer: Self-pay | Admitting: Family Medicine

## 2017-10-18 ENCOUNTER — Telehealth: Payer: Self-pay

## 2017-10-18 MED ORDER — ATORVASTATIN CALCIUM 20 MG PO TABS: 20.0000 mg | ORAL_TABLET | Freq: Every day | ORAL | 3 refills | Status: DC

## 2017-10-18 NOTE — Telephone Encounter (Signed)
Patient stated the pharmacy would not give him refills on the atorvastatin because he needed an office visit  and he has since started back on it.

## 2017-10-18 NOTE — Telephone Encounter (Signed)
That makes no sense I have refilled

## 2017-10-27 ENCOUNTER — Other Ambulatory Visit: Payer: Self-pay | Admitting: Family Medicine

## 2017-10-27 NOTE — Telephone Encounter (Signed)
Copied from CRM [phone]. Topic: Quick Communication - Rx Refill/Question >> Oct 27, 2017  2:59 PM Avie ArenasSimmons, Krystin Keeven L, NT wrote: Medication: insulin detemir (LEVEMIR) 100 UNIT/ML injection   Has the patient contacted their pharmacy? no (Agent: If no, request that the patient contact the pharmacy for the refill (Agent: If yes, when and what did the pharmacy advise?  Preferred Pharmacy (with phone number or street name [id number] - [place] - [id number] [phone] (Phone) [phone] (Fax)      Agent: Please be advised that RX refills may take up to 3 business days. We ask that you follow-up with your pharmacy.

## 2017-10-28 NOTE — Telephone Encounter (Signed)
Insulin levemir refill Last OV:06/22/17 Last refill:09/12/15 (expired) AVW:[id number]:[facility] [place] [id number] [phone] (Phone) [phone])

## 2017-10-29 MED ORDER — INSULIN DETEMIR 100 UNIT/ML ~~LOC~~ SOLN: 10.0000 [IU] | Freq: Every day | SUBCUTANEOUS | 5 refills | Status: DC

## 2018-01-10 ENCOUNTER — Other Ambulatory Visit: Payer: Self-pay | Admitting: Family Medicine

## 2018-01-13 ENCOUNTER — Ambulatory Visit: Payer: PRIVATE HEALTH INSURANCE | Admitting: Family Medicine

## 2018-01-13 VITALS — BP 132/72 | HR 56 | Temp 97.6°F | Resp 18 | Wt 197.6 lb

## 2018-01-13 DIAGNOSIS — E103292 Type 1 diabetes mellitus with mild nonproliferative diabetic retinopathy without macular edema, left eye: Secondary | ICD-10-CM

## 2018-01-13 DIAGNOSIS — E782 Mixed hyperlipidemia: Secondary | ICD-10-CM | POA: Diagnosis not present

## 2018-01-13 DIAGNOSIS — G4700 Insomnia, unspecified: Secondary | ICD-10-CM | POA: Diagnosis not present

## 2018-01-13 DIAGNOSIS — Z23 Encounter for immunization: Secondary | ICD-10-CM | POA: Diagnosis not present

## 2018-01-13 DIAGNOSIS — E875 Hyperkalemia: Secondary | ICD-10-CM

## 2018-01-13 DIAGNOSIS — I10 Essential (primary) hypertension: Secondary | ICD-10-CM | POA: Diagnosis not present

## 2018-01-13 NOTE — Patient Instructions (Addendum)
Unisom over the counter sleep and can take Melatonin 2 to 10 mg dose  Encouraged good sleep hygiene such as dark, quiet room. No blue/green glowing lights such as computer screens in bedroom. No alcohol or stimulants in evening. Cut down on caffeine as able. Regular exercise is helpful but not just prior to bed time.   Call insurance and confirm coverage of Shingrix shot, 2 shots over 2-6 months and/or get [place] proof of the shot from work.   Insomnia Insomnia is a sleep disorder that makes it difficult to fall asleep or to stay asleep. Insomnia can cause tiredness (fatigue), low energy, difficulty concentrating, mood swings, and poor performance at work or school. There are three different ways to classify insomnia:  Difficulty falling asleep.  Difficulty staying asleep.  Waking up too early in the morning.  Any type of insomnia can be long-term (chronic) or short-term (acute). Both are common. Short-term insomnia usually lasts for three months or less. Chronic insomnia occurs at least three times a week for longer than three months. What are the causes? Insomnia may be caused by another condition, situation, or substance, such as:  Anxiety.  Certain medicines.  Gastroesophageal reflux disease (GERD) or other gastrointestinal conditions.  Asthma or other breathing conditions.  Restless legs syndrome, sleep apnea, or other sleep disorders.  Chronic pain.  Menopause. This may include hot flashes.  Stroke.  Abuse of alcohol, tobacco, or illegal drugs.  Depression.  Caffeine.  Neurological disorders, such as Alzheimer disease.  An overactive thyroid (hyperthyroidism).  The cause of insomnia may not be known. What increases the risk? Risk factors for insomnia include:  Gender. Women are more commonly affected than men.  Age. Insomnia is more common as you get older.  Stress. This may involve your professional or personal life.  Income. Insomnia is more common in  people with lower income.  Lack of exercise.  Irregular work schedule or night shifts.  Traveling between different time zones.  What are the signs or symptoms? If you have insomnia, trouble falling asleep or trouble staying asleep is the main symptom. This may lead to other symptoms, such as:  Feeling fatigued.  Feeling nervous about going to sleep.  Not feeling rested in the morning.  Having trouble concentrating.  Feeling irritable, anxious, or depressed.  How is this treated? Treatment for insomnia depends on the cause. If your insomnia is caused by an underlying condition, treatment will focus on addressing the condition. Treatment may also include:  Medicines to help you sleep.  Counseling or therapy.  Lifestyle adjustments.  Follow these instructions at home:  Take medicines only as directed by your health care provider.  Keep regular sleeping and waking hours. Avoid naps.  Keep a sleep diary to help you and your health care provider figure out what could be causing your insomnia. Include: ? When you sleep. ? When you wake up during the night. ? How well you sleep. ? How rested you feel the next day. ? Any side effects of medicines you are taking. ? What you eat and drink.  Make your bedroom a comfortable place where it is easy to fall asleep: ? Put up shades or special blackout curtains to block light from outside. ? Use a white noise machine to block noise. ? Keep the temperature cool.  Exercise regularly as directed by your health care provider. Avoid exercising right before bedtime.  Use relaxation techniques to manage stress. Ask your health care provider to suggest some techniques  that may work well for you. These may include: ? Breathing exercises. ? Routines to release muscle tension. ? Visualizing peaceful scenes.  Cut back on alcohol, caffeinated beverages, and cigarettes, especially close to bedtime. These can disrupt your sleep.  Do not  overeat or eat spicy foods right before bedtime. This can lead to digestive discomfort that can make it hard for you to sleep.  Limit screen use before bedtime. This includes: ? Watching TV. ? Using your smartphone, tablet, and computer.  Stick to a routine. This can help you fall asleep faster. Try to do a quiet activity, brush your teeth, and go to bed at the same time each night.  Get out of bed if you are still awake after 15 minutes of trying to sleep. Keep the lights down, but try reading or doing a quiet activity. When you feel sleepy, go back to bed.  Make sure that you drive carefully. Avoid driving if you feel very sleepy.  Keep all follow-up appointments as directed by your health care provider. This is important. Contact a health care provider if:  You are tired throughout the day or have trouble in your daily routine due to sleepiness.  You continue to have sleep problems or your sleep problems get worse. Get help right away if:  You have serious thoughts about hurting yourself or someone else. This information is not intended to replace advice given to you by your health care provider. Make sure you discuss any questions you have with your health care provider. Document Released: 03/20/2000 Document Revised: 08/23/2015 Document Reviewed: 12/22/2013 Elsevier Interactive Patient Education  [employer].

## 2018-01-14 ENCOUNTER — Telehealth: Payer: Self-pay | Admitting: Family Medicine

## 2018-01-14 DIAGNOSIS — I10 Essential (primary) hypertension: Secondary | ICD-10-CM

## 2018-01-14 LAB — CBC
HCT: 40.5 % (ref 39.0–52.0)
Hemoglobin: 13.2 g/dL (ref 13.0–17.0)
MCHC: 32.5 g/dL (ref 30.0–36.0)
MCV: 89.6 fl (ref 78.0–100.0)
PLATELETS: 173 10*3/uL (ref 150.0–400.0)
RBC: 4.52 Mil/uL (ref 4.22–5.81)
RDW: 12.9 % (ref 11.5–15.5)
WBC: 7.6 10*3/uL (ref 4.0–10.5)

## 2018-01-14 LAB — LIPID PANEL
CHOL/HDL RATIO: 3
CHOLESTEROL: 160 mg/dL (ref 0–200)
HDL: 55.3 mg/dL (ref >39.00)
LDL CALC: 90 mg/dL (ref 0–99)
NonHDL: 105.14
TRIGLYCERIDES: 77 mg/dL (ref 0.0–149.0)
VLDL: 15.4 mg/dL (ref 0.0–40.0)

## 2018-01-14 LAB — COMPREHENSIVE METABOLIC PANEL
ALT: 21 U/L (ref 0–53)
AST: 20 U/L (ref 0–37)
Albumin: 4.4 g/dL (ref 3.5–5.2)
Alkaline Phosphatase: 58 U/L (ref 39–117)
BUN: 25 mg/dL — AB (ref 6–23)
CHLORIDE: 104 meq/L (ref 96–112)
CO2: 31 meq/L (ref 19–32)
CREATININE: 1.14 mg/dL (ref 0.40–1.50)
Calcium: 9.8 mg/dL (ref 8.4–10.5)
GFR: 70.24 mL/min (ref >60.00)
Glucose, Bld: 139 mg/dL — ABNORMAL HIGH (ref 70–99)
POTASSIUM: 5.5 meq/L — AB (ref 3.5–5.1)
SODIUM: 142 meq/L (ref 135–145)
Total Bilirubin: 0.4 mg/dL (ref 0.2–1.2)
Total Protein: 6.9 g/dL (ref 6.0–8.3)

## 2018-01-14 LAB — HEMOGLOBIN A1C: Hgb A1c MFr Bld: 6.6 % — ABNORMAL HIGH (ref 4.6–6.5)

## 2018-01-14 LAB — TSH: TSH: 3.34 u[IU]/mL (ref 0.35–4.50)

## 2018-01-14 NOTE — Telephone Encounter (Signed)
Copied from CRM [id number]. Topic: Quick Communication - Rx Refill/Question >> Jan 14, 2018  3:29 PM Beryle Beams, Joyce Copa wrote: Medication: pt called and stated that he is needed a prescription for needles. Pt stated that it keeps getting harder and harder to get them with out an RX. Please advise. [id number] - [place], [place] - [address] AT [facility] OF [facility] [address] [phone] (Phone) [phone] (Fax)

## 2018-01-16 DIAGNOSIS — E875 Hyperkalemia: Secondary | ICD-10-CM | POA: Insufficient documentation

## 2018-01-16 DIAGNOSIS — G4700 Insomnia, unspecified: Secondary | ICD-10-CM | POA: Insufficient documentation

## 2018-01-16 NOTE — Progress Notes (Signed)
Subjective:    Patient ID: Scott Adkins, male    DOB: 07/05/1960, 57 y.o.   MRN: [id number]  No chief complaint on file.   HPI Patient is in today for follow up. He feels well today but confims he continues to struggle with poor sleep. He has tried Melatonin and other tactics but has trouble maintaining sleep. Thus notes fatigue. No recent febrile illness or hospitalizations. No polyuria or polydipsia. Sugars have been labile as low as 44  To high of 253 with average in the 130s. Denies CP/palp/SOB/HA/congestion/fevers/GI or GU c/o. Taking meds as prescribed  Past Medical History:  Diagnosis Date  . Anemia 12/22/2015  . Arthritis   . Chicken pox as a child  . Constipation 12/22/2016  . Diabetes mellitus type I (HCC)    uses insulin pump// managed by Dr. Casimiro Needle Altheimer  . 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 Hyperlipidemia   . Hypertension   . Injury of right rotator cuff 08/16/2012   Has been seen by [facility]  . Left flank pain 09/12/2015  . Mumps as a child  . Pain in joint, lower leg 01/24/2016  . Preventative health care 02/19/2013  . Sun-damaged skin 12/06/2015    Past Surgical History:  Procedure Laterality Date  . insulin pump     placed and removed  . REFRACTIVE SURGERY Right 2007   [facility]  . ROTATOR CUFF REPAIR  2004   left    Family History  Problem Relation Age of Onset  . Hypertension Mother   . Stroke Father   . Hypertension Father   . Hyperlipidemia Father   . Hypertension Brother   . Hyperlipidemia Brother   . Mental illness Brother        depression  . Heart disease Maternal Grandmother        MI  . Heart disease Maternal Grandfather        MI  . Hyperlipidemia Brother   . Hypertension Brother   . Colon cancer Neg Hx   . Prostate cancer Neg Hx   . Breast cancer Neg Hx   . Rectal cancer Neg Hx   . Diabetes Neg Hx     Social History   Socioeconomic History  . Marital status: Married   Spouse name: Not on file  . Number of children: 3  . Years of education: Not on file  . Highest education level: Not on file  Occupational History  . Occupation: welder  Social Needs  . Financial resource strain: Not on file  . Food insecurity:    Worry: Not on file    Inability: Not on file  . Transportation needs:    Medical: Not on file    Non-medical: Not on file  Tobacco Use  . Smoking status: Never Smoker  . Smokeless tobacco: Never Used  Substance and Sexual Activity  . Alcohol use: No    Alcohol/week: 1.0 standard drinks    Types: 1 Shots of liquor per week  . Drug use: No  . Sexual activity: Not on file    Comment: lives with wife and mother in law, no dietary restrictions  Lifestyle  . Physical activity:    Days per week: Not on file    Minutes per session: Not on file  . Stress: Not on file  Relationships  . Social connections:    Talks on phone: Not on file    Gets together: Not on file  Attends religious service: Not on file    Active member of club or organization: Not on file    Attends meetings of clubs or organizations: Not on file    Relationship status: Not on file  . Intimate partner violence:    Fear of current or ex partner: Not on file    Emotionally abused: Not on file    Physically abused: Not on file    Forced sexual activity: Not on file  Other Topics Concern  . Not on file  Social History Narrative   Married 14 yrs   2 daughters; 1 son   Occupation: welder       Outpatient Medications Prior to Visit  Medication Sig Dispense Refill  . aspirin 81 MG tablet Take 81 mg by mouth daily.    Marland Kitchen atorvastatin (LIPITOR) 20 MG tablet Take 1 tablet (20 mg total) by mouth daily. 30 tablet 3  . cetirizine (ZYRTEC) 10 MG tablet Take 1 tablet (10 mg total) by mouth daily as needed for allergies. 30 tablet 5  . Cholecalciferol (VITAMIN D3) 1000 UNITS CAPS Take 2,000 Units by mouth daily.    . clotrimazole (CLOTRIMAZOLE ANTI-FUNGAL) 1 % cream Apply 1  application topically 2 (two) times daily. 113 g 03  . Ferrous Fumarate (HEMOCYTE) 324 (106 Fe) MG TABS tablet Take 1 tablet (106 mg of iron total) by mouth daily. 30 tablet 3  . fluticasone (FLONASE) 50 MCG/ACT nasal spray Place 2 sprays into both nostrils daily. 48 g 2  . fluticasone (FLONASE) 50 MCG/ACT nasal spray Place 2 sprays into both nostrils daily. 16 g 5  . insulin detemir (LEVEMIR) 100 UNIT/ML injection Inject 0.1 mLs (10 Units total) into the skin at bedtime. 10 mL 5  . metoprolol succinate (TOPROL-XL) 50 MG 24 hr tablet TAKE 1 TABLET BY MOUTH ONCE DAILY -  TAKE  WITH  OR  IMMEDIATLEY  FOLLOWING  A  MEAL 90 tablet 1  . NOVOLOG 100 UNIT/ML injection INJECT 7-20 UNITS INTO THE SKIN 3 TIMES DAIY WITH MEALS 40 mL 4   No facility-administered medications prior to visit.     Allergies  Allergen Reactions  . Lisinopril     Hyperkalemia   . Viagra [Sildenafil Citrate] Other (See Comments)    headache    Review of Systems  Constitutional: Positive for malaise/fatigue. Negative for fever.  HENT: Negative for congestion.   Eyes: Negative for blurred vision.  Respiratory: Negative for shortness of breath.   Cardiovascular: Negative for chest pain, palpitations and leg swelling.  Gastrointestinal: Negative for abdominal pain, blood in stool and nausea.  Genitourinary: Negative for dysuria and frequency.  Musculoskeletal: Negative for falls.  Skin: Negative for rash.  Neurological: Negative for dizziness, loss of consciousness and headaches.  Endo/Heme/Allergies: Negative for environmental allergies.  Psychiatric/Behavioral: Negative for depression. The patient has insomnia. The patient is not nervous/anxious.        Objective:    Physical Exam  Constitutional: He is oriented to person, place, and time. He appears well-developed and well-nourished. No distress.  HENT:  Head: Normocephalic and atraumatic.  Nose: Nose normal.  Eyes: Right eye exhibits no discharge. Left eye  exhibits no discharge.  Neck: Normal range of motion. Neck supple.  Cardiovascular: Normal rate and regular rhythm.  No murmur heard. Pulmonary/Chest: Effort normal and breath sounds normal.  Abdominal: Soft. Bowel sounds are normal. There is no tenderness.  Musculoskeletal: He exhibits no edema.  Neurological: He is alert and oriented to person, place,  and time.  Skin: Skin is warm and dry.  Psychiatric: He has a normal mood and affect.  Nursing note and vitals reviewed.   BP 132/72   Pulse (!) 56   Temp 97.6 F (36.4 C) (Oral)   Resp 18   Wt 197 lb 9.6 oz (89.6 kg)   SpO2 95%   BMI 30.04 kg/m  Wt Readings from Last 3 Encounters:  01/13/18 197 lb 9.6 oz (89.6 kg)  10/14/17 194 lb 9.6 oz (88.3 kg)  06/22/17 189 lb 12.8 oz (86.1 kg)     Lab Results  Component Value Date   WBC 7.6 01/13/2018   HGB 13.2 01/13/2018   HCT 40.5 01/13/2018   PLT 173.0 01/13/2018   GLUCOSE 139 (H) 01/13/2018   CHOL 160 01/13/2018   TRIG 77.0 01/13/2018   HDL 55.30 01/13/2018   LDLDIRECT 71 09/10/2008   LDLCALC 90 01/13/2018   ALT 21 01/13/2018   AST 20 01/13/2018   NA 142 01/13/2018   K 5.5 (H) 01/13/2018   CL 104 01/13/2018   CREATININE 1.14 01/13/2018   BUN 25 (H) 01/13/2018   CO2 31 01/13/2018   TSH 3.34 01/13/2018   PSA 0.38 10/14/2017   HGBA1C 6.6 (H) 01/13/2018   MICROALBUR <0.7 02/15/2015    Lab Results  Component Value Date   TSH 3.34 01/13/2018   Lab Results  Component Value Date   WBC 7.6 01/13/2018   HGB 13.2 01/13/2018   HCT 40.5 01/13/2018   MCV 89.6 01/13/2018   PLT 173.0 01/13/2018   Lab Results  Component Value Date   NA 142 01/13/2018   K 5.5 (H) 01/13/2018   CO2 31 01/13/2018   GLUCOSE 139 (H) 01/13/2018   BUN 25 (H) 01/13/2018   CREATININE 1.14 01/13/2018   BILITOT 0.4 01/13/2018   ALKPHOS 58 01/13/2018   AST 20 01/13/2018   ALT 21 01/13/2018   PROT 6.9 01/13/2018   ALBUMIN 4.4 01/13/2018   CALCIUM 9.8 01/13/2018   GFR 70.24 01/13/2018    Lab Results  Component Value Date   CHOL 160 01/13/2018   Lab Results  Component Value Date   HDL 55.30 01/13/2018   Lab Results  Component Value Date   LDLCALC 90 01/13/2018   Lab Results  Component Value Date   TRIG 77.0 01/13/2018   Lab Results  Component Value Date   CHOLHDL 3 01/13/2018   Lab Results  Component Value Date   HGBA1C 6.6 (H) 01/13/2018       Assessment & Plan:   Problem List Items Addressed This Visit    Hyperlipidemia    Tolerating statin, encouraged heart healthy diet, avoid trans fats, minimize simple carbs and saturated fats. Increase exercise as tolerated      Relevant Orders   Lipid panel (Completed)   Essential hypertension - Primary    Well controlled, no changes to meds. Encouraged heart healthy diet such as the DASH diet and exercise as tolerated.       Relevant Orders   CBC (Completed)   Comprehensive metabolic panel (Completed)   TSH (Completed)   DM (diabetes mellitus), type 1 with ophthalmic complications (HCC)    hgba1c acceptable, minimize simple carbs. Increase exercise as tolerated. Continue current meds      Relevant Orders   Hemoglobin A1c (Completed)   Insomnia    Encouraged good sleep hygiene such as dark, quiet room. No blue/green glowing lights such as computer screens in bedroom. No alcohol or stimulants in evening.  Cut down on caffeine as able. Regular exercise is helpful but not just prior to bed time. Has not responded to melatonin. May try Unisom      Hyperkalemia    Asymptomatic. Minimize potassium in diet and recheck cmp         I am having Scott Adkins "Ron" maintain his Vitamin D3, aspirin, fluticasone, Ferrous Fumarate, clotrimazole, NOVOLOG, fluticasone, cetirizine, atorvastatin, insulin detemir, and metoprolol succinate.  No orders of the defined types were placed in this encounter.    Danise Edge, MD

## 2018-01-16 NOTE — Assessment & Plan Note (Signed)
hgba1c acceptable, minimize simple carbs. Increase exercise as tolerated. Continue current meds 

## 2018-01-16 NOTE — Assessment & Plan Note (Signed)
Asymptomatic. Minimize potassium in diet and recheck cmp 

## 2018-01-16 NOTE — Assessment & Plan Note (Signed)
Tolerating statin, encouraged heart healthy diet, avoid trans fats, minimize simple carbs and saturated fats. Increase exercise as tolerated 

## 2018-01-16 NOTE — Assessment & Plan Note (Signed)
Well controlled, no changes to meds. Encouraged heart healthy diet such as the DASH diet and exercise as tolerated.  °

## 2018-01-16 NOTE — Assessment & Plan Note (Signed)
Encouraged good sleep hygiene such as dark, quiet room. No blue/green glowing lights such as computer screens in bedroom. No alcohol or stimulants in evening. Cut down on caffeine as able. Regular exercise is helpful but not just prior to bed time. Has not responded to melatonin. May try Unisom

## 2018-01-18 MED ORDER — PEN NEEDLES 29G X 12MM MISC: 3 refills | Status: DC

## 2018-01-20 NOTE — Telephone Encounter (Signed)
Sent needles in. 

## 2018-01-21 ENCOUNTER — Other Ambulatory Visit (INDEPENDENT_AMBULATORY_CARE_PROVIDER_SITE_OTHER): Payer: PRIVATE HEALTH INSURANCE

## 2018-01-21 DIAGNOSIS — I10 Essential (primary) hypertension: Secondary | ICD-10-CM | POA: Diagnosis not present

## 2018-01-21 LAB — COMPREHENSIVE METABOLIC PANEL
AG Ratio: 1.6 (calc) (ref 1.0–2.5)
ALKALINE PHOSPHATASE (APISO): 62 U/L (ref 40–115)
ALT: 18 U/L (ref 9–46)
AST: 23 U/L (ref 10–35)
Albumin: 4 g/dL (ref 3.6–5.1)
BUN / CREAT RATIO: 26 (calc) — AB (ref 6–22)
BUN: 26 mg/dL — ABNORMAL HIGH (ref 7–25)
CHLORIDE: 105 mmol/L (ref 98–110)
CO2: 27 mmol/L (ref 20–32)
Calcium: 9.1 mg/dL (ref 8.6–10.3)
Creat: 1 mg/dL (ref 0.70–1.33)
GLOBULIN: 2.5 g/dL (ref 1.9–3.7)
GLUCOSE: 112 mg/dL — AB (ref 65–99)
Potassium: 4.2 mmol/L (ref 3.5–5.3)
Sodium: 139 mmol/L (ref 135–146)
Total Bilirubin: 0.4 mg/dL (ref 0.2–1.2)
Total Protein: 6.5 g/dL (ref 6.1–8.1)

## 2018-01-21 NOTE — Addendum Note (Signed)
Addended by: Verdie Shire on: 01/21/2018 03:15 PM   Modules accepted: Orders

## 2018-01-21 NOTE — Addendum Note (Signed)
Addended by: Verdie Shire on: 01/21/2018 03:14 PM   Modules accepted: Orders

## 2018-01-24 ENCOUNTER — Encounter: Payer: Self-pay | Admitting: Family

## 2018-01-24 ENCOUNTER — Ambulatory Visit: Payer: PRIVATE HEALTH INSURANCE | Admitting: Family

## 2018-01-24 ENCOUNTER — Ambulatory Visit: Payer: Self-pay

## 2018-01-24 VITALS — BP 149/72 | HR 53 | Temp 98.2°F | Resp 16 | Ht 68.0 in | Wt 194.0 lb

## 2018-01-24 DIAGNOSIS — I10 Essential (primary) hypertension: Secondary | ICD-10-CM

## 2018-01-24 MED ORDER — METOPROLOL SUCCINATE ER 100 MG PO TB24: 100.0000 mg | ORAL_TABLET | Freq: Every day | ORAL | 0 refills | Status: DC

## 2018-01-24 NOTE — Patient Instructions (Signed)
Please increase metoprolol from 50mg  to 100mg  once daily.

## 2018-01-24 NOTE — Telephone Encounter (Signed)
Pt called with C/O high bp over several days. His Bp is 171/73 and then 179/84.  He has taken it at other times and bp stays consistently high. Pt states his medication has been changed recently because of potassium issues. Pt has a headache and has had blurred vision but denies blurred vision today. Pt denies missing any doses of his BP medication today. Appointment scheduled for today per protocol. Care advice read to patient. Patient verbalized understanding of all instructions.  Reason for Disposition . [1] Systolic BP  >= 200 OR Diastolic >= 120  AND [2] having NO cardiac or neurologic symptoms  Answer Assessment - Initial Assessment Questions 1. BLOOD PRESSURE: "What is the blood pressure?" "Did you take at least two measurements 5 minutes apart?"     171/73  179/84 2. ONSET: "When did you take your blood pressure?"     5 minutes ago and now  3. HOW: "How did you obtain the blood pressure?" (e.g., visiting nurse, automatic home BP monitor)     home 4. HISTORY: "Do you have a history of high blood pressure?"     yes 5. MEDICATIONS: "Are you taking any medications for blood pressure?" "Have you missed any doses recently?"    no 6. OTHER SYMPTOMS: "Do you have any symptoms?" (e.g., headache, chest pain, blurred vision, difficulty breathing, weakness)     Headache, blurred vision 7. PREGNANCY: "Is there any chance you are pregnant?" "When was your last menstrual period?"   N/A  Protocols used: HIGH BLOOD PRESSURE-A-AH

## 2018-01-24 NOTE — Progress Notes (Signed)
Subjective:    Patient ID: Scott Adkins, male    DOB: September 07, 1960, 57 y.o.   MRN: [id number]  HPI  Patient is a 57 yr old male who presents today to discuss hypertension. Reports that his bp was 195/83 on Saturday. Ended up taking toprol that night and another tablet Sunday AM. Had HA on Saturday prompting him to check his blood pressure.  He also had some associated blurred vision on Saturday night. Reports resolution of these symptoms. Denies any aggravating stressors.   Did not have numbness/weakness or blurred vision.    BP Readings from Last 3 Encounters:  01/24/18 (!) 149/72  01/16/18 132/72  10/14/17 (!) 156/80     Review of Systems See HPI Past Medical History:  Diagnosis Date  . Anemia 12/22/2015  . Arthritis   . Chicken pox as a child  . Constipation 12/22/2016  . Diabetes mellitus type I (HCC)    uses insulin pump// managed by Dr. Casimiro Needle [place]  . 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 Hyperlipidemia   . Hypertension   . Injury of right rotator cuff 08/16/2012   Has been seen by [facility]  . Left flank pain 09/12/2015  . Mumps as a child  . Pain in joint, lower leg 01/24/2016  . Preventative health care 02/19/2013  . Sun-damaged skin 12/06/2015     Social History   Socioeconomic History  . Marital status: Married    Spouse name: Not on file  . Number of children: 3  . Years of education: Not on file  . Highest education level: Not on file  Occupational History  . Occupation: welder  Social Needs  . Financial resource strain: Not on file  . Food insecurity:    Worry: Not on file    Inability: Not on file  . Transportation needs:    Medical: Not on file    Non-medical: Not on file  Tobacco Use  . Smoking status: Never Smoker  . Smokeless tobacco: Never Used  Substance and Sexual Activity  . Alcohol use: No    Alcohol/week: 1.0 standard drinks    Types: 1 Shots of liquor per week  . Drug use: No  . Sexual  activity: Not on file    Comment: lives with wife and mother in law, no dietary restrictions  Lifestyle  . Physical activity:    Days per week: Not on file    Minutes per session: Not on file  . Stress: Not on file  Relationships  . Social connections:    Talks on phone: Not on file    Gets together: Not on file    Attends religious service: Not on file    Active member of club or organization: Not on file    Attends meetings of clubs or organizations: Not on file    Relationship status: Not on file  . Intimate partner violence:    Fear of current or ex partner: Not on file    Emotionally abused: Not on file    Physically abused: Not on file    Forced sexual activity: Not on file  Other Topics Concern  . Not on file  Social History Narrative   Married 14 yrs   2 daughters; 1 son   Occupation: Psychologist, occupational       Past Surgical History:  Procedure Laterality Date  . insulin pump     placed and removed  . REFRACTIVE SURGERY Right [facility]   [facility]  . ROTATOR CUFF REPAIR  2004   left    Family History  Problem Relation Age of Onset  . Hypertension Mother   . Stroke Father   . Hypertension Father   . Hyperlipidemia Father   . Hypertension Brother   . Hyperlipidemia Brother   . Mental illness Brother        depression  . Heart disease Maternal Grandmother        MI  . Heart disease Maternal Grandfather        MI  . Hyperlipidemia Brother   . Hypertension Brother   . Colon cancer Neg Hx   . Prostate cancer Neg Hx   . Breast cancer Neg Hx   . Rectal cancer Neg Hx   . Diabetes Neg Hx     Allergies  Allergen Reactions  . Lisinopril     Hyperkalemia   . Viagra [Sildenafil Citrate] Other (See Comments)    headache    Current Outpatient Medications on File Prior to Visit  Medication Sig Dispense Refill  . aspirin 81 MG tablet Take 81 mg by mouth daily.    Marland Kitchen atorvastatin (LIPITOR) 20 MG tablet Take 1 tablet (20 mg total) by mouth daily. 30 tablet 3  .  cetirizine (ZYRTEC) 10 MG tablet Take 1 tablet (10 mg total) by mouth daily as needed for allergies. 30 tablet 5  . Cholecalciferol (VITAMIN D3) 1000 UNITS CAPS Take 2,000 Units by mouth daily.    . clotrimazole (CLOTRIMAZOLE ANTI-FUNGAL) 1 % cream Apply 1 application topically 2 (two) times daily. 113 g 03  . Ferrous Fumarate (HEMOCYTE) 324 (106 Fe) MG TABS tablet Take 1 tablet (106 mg of iron total) by mouth daily. 30 tablet 3  . fluticasone (FLONASE) 50 MCG/ACT nasal spray Place 2 sprays into both nostrils daily. 48 g 2  . fluticasone (FLONASE) 50 MCG/ACT nasal spray Place 2 sprays into both nostrils daily. 16 g 5  . insulin detemir (LEVEMIR) 100 UNIT/ML injection Inject 0.1 mLs (10 Units total) into the skin at bedtime. 10 mL 5  . Insulin Pen Needle (PEN NEEDLES) 29G X MISC Use as directed 100 each 3  . NOVOLOG 100 UNIT/ML injection INJECT 7-20 UNITS INTO THE SKIN 3 TIMES DAIY WITH MEALS 40 mL 4   No current facility-administered medications on file prior to visit.     BP (!) 149/72 (BP Location: Left Arm, Patient Position: Sitting, Cuff Size: Large)   Pulse (!) 53   Temp 98.2 F (36.8 C) (Oral)   Resp 16   Ht 5\' 8"  (1.727 m)   Wt 194 lb (88 kg)   SpO2 99%   BMI 29.50 kg/m        Objective:   Physical Exam  Constitutional: He is oriented to person, place, and time. He appears well-developed and well-nourished. No distress.  HENT:  Head: Normocephalic and atraumatic.  Cardiovascular: Normal rate and regular rhythm.  No murmur heard. Pulmonary/Chest: Effort normal and breath sounds normal. No respiratory distress. He has no wheezes. He has no rales.  Musculoskeletal: He exhibits no edema.  Neurological: He is alert and oriented to person, place, and time.  Skin: Skin is warm and dry.  Psychiatric: He has a normal mood and affect. His behavior is normal. Thought content normal.          Assessment & Plan:  HTN- uncontrolled. Increase toprol xl from 50mg  to 100mg   once daily. Follow up in 2 weeks. Pt advised  to call sooner if BP elevated.

## 2018-05-16 ENCOUNTER — Ambulatory Visit: Payer: Self-pay | Admitting: Family Medicine

## 2018-05-16 DIAGNOSIS — Z0289 Encounter for other administrative examinations: Secondary | ICD-10-CM

## 2018-05-19 ENCOUNTER — Ambulatory Visit: Payer: Self-pay | Admitting: Family Medicine

## 2018-06-09 ENCOUNTER — Ambulatory Visit: Payer: PRIVATE HEALTH INSURANCE | Admitting: Family Medicine

## 2018-06-09 DIAGNOSIS — E103292 Type 1 diabetes mellitus with mild nonproliferative diabetic retinopathy without macular edema, left eye: Secondary | ICD-10-CM

## 2018-06-09 DIAGNOSIS — E782 Mixed hyperlipidemia: Secondary | ICD-10-CM | POA: Diagnosis not present

## 2018-06-09 DIAGNOSIS — E875 Hyperkalemia: Secondary | ICD-10-CM

## 2018-06-09 DIAGNOSIS — I10 Essential (primary) hypertension: Secondary | ICD-10-CM

## 2018-06-09 MED ORDER — ATORVASTATIN CALCIUM 20 MG PO TABS: 20.0000 mg | ORAL_TABLET | Freq: Every day | ORAL | 3 refills | Status: DC

## 2018-06-09 MED ORDER — ATORVASTATIN CALCIUM 20 MG PO TABS: 20.0000 mg | ORAL_TABLET | Freq: Every day | ORAL | 1 refills | Status: DC

## 2018-06-09 MED ORDER — METOPROLOL SUCCINATE ER 100 MG PO TB24: 100.0000 mg | ORAL_TABLET | Freq: Every day | ORAL | 1 refills | Status: DC

## 2018-06-09 NOTE — Assessment & Plan Note (Signed)
hgba1c acceptable, minimize simple carbs. Increase exercise as tolerated. Continue current meds 

## 2018-06-09 NOTE — Assessment & Plan Note (Signed)
Tolerating statin, encouraged heart healthy diet, avoid trans fats, minimize simple carbs and saturated fats. Increase exercise as tolerated 

## 2018-06-09 NOTE — Assessment & Plan Note (Signed)
Poorly controlled will alter medications, encouraged DASH diet, minimize caffeine and obtain adequate sleep. Report concerning symptoms and follow up as directed and as needed. He has been out of his new bp med for a week. He notes it was well controlled at 130/70

## 2018-06-09 NOTE — Assessment & Plan Note (Signed)
Improved on last check 

## 2018-06-10 LAB — COMPREHENSIVE METABOLIC PANEL
ALT: 25 U/L (ref 0–53)
AST: 31 U/L (ref 0–37)
Albumin: 4.2 g/dL (ref 3.5–5.2)
Alkaline Phosphatase: 66 U/L (ref 39–117)
BUN: 17 mg/dL (ref 6–23)
CALCIUM: 9.5 mg/dL (ref 8.4–10.5)
CHLORIDE: 103 meq/L (ref 96–112)
CO2: 30 meq/L (ref 19–32)
Creatinine, Ser: 0.94 mg/dL (ref 0.40–1.50)
GFR: 82.44 mL/min (ref >60.00)
GLUCOSE: 88 mg/dL (ref 70–99)
Potassium: 4.7 mEq/L (ref 3.5–5.1)
SODIUM: 140 meq/L (ref 135–145)
Total Bilirubin: 0.5 mg/dL (ref 0.2–1.2)
Total Protein: 6.6 g/dL (ref 6.0–8.3)

## 2018-06-10 LAB — CBC
HCT: 38.7 % — ABNORMAL LOW (ref 39.0–52.0)
Hemoglobin: 12.9 g/dL — ABNORMAL LOW (ref 13.0–17.0)
MCHC: 33.2 g/dL (ref 30.0–36.0)
MCV: 89.1 fl (ref 78.0–100.0)
Platelets: 197 10*3/uL (ref 150.0–400.0)
RBC: 4.35 Mil/uL (ref 4.22–5.81)
RDW: 12.5 % (ref 11.5–15.5)
WBC: 8 10*3/uL (ref 4.0–10.5)

## 2018-06-10 LAB — TSH: TSH: 3.15 u[IU]/mL (ref 0.35–4.50)

## 2018-06-10 LAB — LIPID PANEL
Cholesterol: 255 mg/dL — ABNORMAL HIGH (ref 0–200)
HDL: 58.8 mg/dL (ref >39.00)
LDL Cholesterol: 170 mg/dL — ABNORMAL HIGH (ref 0–99)
NonHDL: 196.36
Total CHOL/HDL Ratio: 4
Triglycerides: 133 mg/dL (ref 0.0–149.0)
VLDL: 26.6 mg/dL (ref 0.0–40.0)

## 2018-06-10 LAB — HEMOGLOBIN A1C: Hgb A1c MFr Bld: 7.2 % — ABNORMAL HIGH (ref 4.6–6.5)

## 2018-06-12 NOTE — Progress Notes (Signed)
Subjective:    Patient ID: Scott Adkins, male    DOB: [birth year], 58 y.o.   MRN: [id number]  No chief complaint on file.   HPI Patient is in today for follow up. He has run out of his Blood pressure medicine and has not taken it this week. He feels well but thinks that is why his pressure is up. No headaches or chest pain. When he has his meds he notes his bp runs about 130/70. Sugars have been labile as low as 65 and as high as 215 but generally between 80 and 115. No polyuria or polydipsia. Denies CP/palp/SOB/HA/congestion/fevers/GI or GU c/o. Taking meds as prescribed  Past Medical History:  Diagnosis Date  . Anemia 12/22/2015  . Arthritis   . Chicken pox as a child  . Constipation 12/22/2016  . Diabetes mellitus type I (HCC)    uses insulin pump// managed by Dr. Casimiro Needle Altheimer  . 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 Hyperlipidemia   . Hypertension   . Injury of right rotator cuff 08/16/2012   Has been seen by [facility]  . Left flank pain 09/12/2015  . Mumps as a child  . Pain in joint, lower leg 01/24/2016  . Preventative health care 02/19/2013  . Sun-damaged skin 12/06/2015    Past Surgical History:  Procedure Laterality Date  . insulin pump     placed and removed  . REFRACTIVE SURGERY Right 2007   [facility]  . ROTATOR CUFF REPAIR  2004   left    Family History  Problem Relation Age of Onset  . Hypertension Mother   . Stroke Father   . Hypertension Father   . Hyperlipidemia Father   . Hypertension Brother   . Hyperlipidemia Brother   . Mental illness Brother        depression  . Heart disease Maternal Grandmother        MI  . Heart disease Maternal Grandfather        MI  . Hyperlipidemia Brother   . Hypertension Brother   . Colon cancer Neg Hx   . Prostate cancer Neg Hx   . Breast cancer Neg Hx   . Rectal cancer Neg Hx   . Diabetes Neg Hx     Social History   Socioeconomic History  . Marital status:  Married    Spouse name: Not on file  . Number of children: 3  . Years of education: Not on file  . Highest education level: Not on file  Occupational History  . Occupation: welder  Social Needs  . Financial resource strain: Not on file  . Food insecurity:    Worry: Not on file    Inability: Not on file  . Transportation needs:    Medical: Not on file    Non-medical: Not on file  Tobacco Use  . Smoking status: Never Smoker  . Smokeless tobacco: Never Used  Substance and Sexual Activity  . Alcohol use: No    Alcohol/week: 1.0 standard drinks    Types: 1 Shots of liquor per week  . Drug use: No  . Sexual activity: Not on file    Comment: lives with wife and mother in law, no dietary restrictions  Lifestyle  . Physical activity:    Days per week: Not on file    Minutes per session: Not on file  . Stress: Not on file  Relationships  . Social connections:    Talks  on phone: Not on file    Gets together: Not on file    Attends religious service: Not on file    Active member of club or organization: Not on file    Attends meetings of clubs or organizations: Not on file    Relationship status: Not on file  . Intimate partner violence:    Fear of current or ex partner: Not on file    Emotionally abused: Not on file    Physically abused: Not on file    Forced sexual activity: Not on file  Other Topics Concern  . Not on file  Social History Narrative   Married 14 yrs   2 daughters; 1 son   Occupation: welder       Outpatient Medications Prior to Visit  Medication Sig Dispense Refill  . aspirin 81 MG tablet Take 81 mg by mouth daily.    . cetirizine (ZYRTEC) 10 MG tablet Take 1 tablet (10 mg total) by mouth daily as needed for allergies. 30 tablet 5  . Cholecalciferol (VITAMIN D3) 1000 UNITS CAPS Take 2,000 Units by mouth daily.    . clotrimazole (CLOTRIMAZOLE ANTI-FUNGAL) 1 % cream Apply 1 application topically 2 (two) times daily. 113 g 03  . Ferrous Fumarate (HEMOCYTE)  324 (106 Fe) MG TABS tablet Take 1 tablet (106 mg of iron total) by mouth daily. 30 tablet 3  . fluticasone (FLONASE) 50 MCG/ACT nasal spray Place 2 sprays into both nostrils daily. 48 g 2  . fluticasone (FLONASE) 50 MCG/ACT nasal spray Place 2 sprays into both nostrils daily. 16 g 5  . insulin detemir (LEVEMIR) 100 UNIT/ML injection Inject 0.1 mLs (10 Units total) into the skin at bedtime. 10 mL 5  . Insulin Pen Needle (PEN NEEDLES) 29G X MISC Use as directed 100 each 3  . NOVOLOG 100 UNIT/ML injection INJECT 7-20 UNITS INTO THE SKIN 3 TIMES DAIY WITH MEALS 40 mL 4  . atorvastatin (LIPITOR) 20 MG tablet Take 1 tablet (20 mg total) by mouth daily. 30 tablet 3  . metoprolol succinate (TOPROL-XL) 100 MG 24 hr tablet Take 1 tablet (100 mg total) by mouth daily. Take with or immediately following a meal. 90 tablet 0   No facility-administered medications prior to visit.     Allergies  Allergen Reactions  . Lisinopril     Hyperkalemia   . Viagra [Sildenafil Citrate] Other (See Comments)    headache    Review of Systems  Constitutional: Negative for fever and malaise/fatigue.  HENT: Negative for congestion.   Eyes: Negative for blurred vision.  Respiratory: Negative for shortness of breath.   Cardiovascular: Negative for chest pain, palpitations and leg swelling.  Gastrointestinal: Negative for abdominal pain, blood in stool and nausea.  Genitourinary: Negative for dysuria and frequency.  Musculoskeletal: Negative for falls.  Skin: Negative for rash.  Neurological: Negative for dizziness, loss of consciousness and headaches.  Endo/Heme/Allergies: Negative for environmental allergies.  Psychiatric/Behavioral: Negative for depression. The patient is not nervous/anxious.        Objective:    Physical Exam Vitals signs and nursing note reviewed.  Constitutional:      General: He is not in acute distress.    Appearance: He is well-developed.  HENT:     Head: Normocephalic and  atraumatic.     Nose: Nose normal.  Eyes:     General:        Right eye: No discharge.        Left eye:  No discharge.  Neck:     Musculoskeletal: Normal range of motion and neck supple.  Cardiovascular:     Rate and Rhythm: Normal rate and regular rhythm.     Heart sounds: No murmur.  Pulmonary:     Effort: Pulmonary effort is normal.     Breath sounds: Normal breath sounds.  Abdominal:     General: Bowel sounds are normal.     Palpations: Abdomen is soft.     Tenderness: There is no abdominal tenderness.  Skin:    General: Skin is warm and dry.  Neurological:     Mental Status: He is alert and oriented to person, place, and time.     BP (!) 162/78 (BP Location: Left Arm, Patient Position: Sitting, Cuff Size: Normal)   Pulse 65   Temp 97.9 F (36.6 C) (Oral)   Resp 18   Wt 205 lb 6.4 oz (93.2 kg)   SpO2 100%   BMI 31.23 kg/m  Wt Readings from Last 3 Encounters:  06/09/18 205 lb 6.4 oz (93.2 kg)  01/24/18 194 lb (88 kg)  01/13/18 197 lb 9.6 oz (89.6 kg)     Lab Results  Component Value Date   WBC 8.0 06/09/2018   HGB 12.9 (L) 06/09/2018   HCT 38.7 (L) 06/09/2018   PLT 197.0 06/09/2018   GLUCOSE 88 06/09/2018   CHOL 255 (H) 06/09/2018   TRIG 133.0 06/09/2018   HDL 58.80 06/09/2018   LDLDIRECT 71 09/10/2008   LDLCALC 170 (H) 06/09/2018   ALT 25 06/09/2018   AST 31 06/09/2018   NA 140 06/09/2018   K 4.7 06/09/2018   CL 103 06/09/2018   CREATININE 0.94 06/09/2018   BUN 17 06/09/2018   CO2 30 06/09/2018   TSH 3.15 06/09/2018   PSA 0.38 10/14/2017   HGBA1C 7.2 (H) 06/09/2018   MICROALBUR <0.7 02/15/2015    Lab Results  Component Value Date   TSH 3.15 06/09/2018   Lab Results  Component Value Date   WBC 8.0 06/09/2018   HGB 12.9 (L) 06/09/2018   HCT 38.7 (L) 06/09/2018   MCV 89.1 06/09/2018   PLT 197.0 06/09/2018   Lab Results  Component Value Date   NA 140 06/09/2018   K 4.7 06/09/2018   CO2 30 06/09/2018   GLUCOSE 88 06/09/2018   BUN 17  06/09/2018   CREATININE 0.94 06/09/2018   BILITOT 0.5 06/09/2018   ALKPHOS 66 06/09/2018   AST 31 06/09/2018   ALT 25 06/09/2018   PROT 6.6 06/09/2018   ALBUMIN 4.2 06/09/2018   CALCIUM 9.5 06/09/2018   GFR 82.44 06/09/2018   Lab Results  Component Value Date   CHOL 255 (H) 06/09/2018   Lab Results  Component Value Date   HDL 58.80 06/09/2018   Lab Results  Component Value Date   LDLCALC 170 (H) 06/09/2018   Lab Results  Component Value Date   TRIG 133.0 06/09/2018   Lab Results  Component Value Date   CHOLHDL 4 06/09/2018   Lab Results  Component Value Date   HGBA1C 7.2 (H) 06/09/2018       Assessment & Plan:   Problem List Items Addressed This Visit    Hyperlipidemia    Tolerating statin, encouraged heart healthy diet, avoid trans fats, minimize simple carbs and saturated fats. Increase exercise as tolerated      Relevant Medications   metoprolol succinate (TOPROL-XL) 100 MG 24 hr tablet   atorvastatin (LIPITOR) 20 MG tablet   Other  Relevant Orders   Lipid panel (Completed)   Essential hypertension    Poorly controlled will alter medications, encouraged DASH diet, minimize caffeine and obtain adequate sleep. Report concerning symptoms and follow up as directed and as needed. He has been out of his new bp med for a week. He notes it was well controlled at 130/70      Relevant Medications   metoprolol succinate (TOPROL-XL) 100 MG 24 hr tablet   atorvastatin (LIPITOR) 20 MG tablet   Other Relevant Orders   CBC (Completed)   Comprehensive metabolic panel (Completed)   TSH (Completed)   DM (diabetes mellitus), type 1 with ophthalmic complications (HCC)    hgba1c acceptable, minimize simple carbs. Increase exercise as tolerated. Continue current meds      Relevant Medications   atorvastatin (LIPITOR) 20 MG tablet   Other Relevant Orders   Hemoglobin A1c (Completed)   Hyperkalemia    Improved on last check         I am having Scott Adkins "Scott Adkins"  maintain his Vitamin D3, aspirin, fluticasone, Ferrous Fumarate, clotrimazole, NovoLOG, fluticasone, cetirizine, insulin detemir, Pen Needles, metoprolol succinate, and atorvastatin.  Meds ordered this encounter  Medications  . DISCONTD: metoprolol succinate (TOPROL-XL) 100 MG 24 hr tablet    Sig: Take 1 tablet (100 mg total) by mouth daily. Take with or immediately following a meal.    Dispense:  90 tablet    Refill:  1  . DISCONTD: atorvastatin (LIPITOR) 20 MG tablet    Sig: Take 1 tablet (20 mg total) by mouth daily.    Dispense:  90 tablet    Refill:  1  . metoprolol succinate (TOPROL-XL) 100 MG 24 hr tablet    Sig: Take 1 tablet (100 mg total) by mouth daily. Take with or immediately following a meal.    Dispense:  90 tablet    Refill:  1  . atorvastatin (LIPITOR) 20 MG tablet    Sig: Take 1 tablet (20 mg total) by mouth daily.    Dispense:  90 tablet    Refill:  3     Danise Edge, MD

## 2018-06-30 ENCOUNTER — Other Ambulatory Visit: Payer: Self-pay | Admitting: Family Medicine

## 2018-07-21 ENCOUNTER — Other Ambulatory Visit: Payer: Self-pay | Admitting: Family Medicine

## 2018-08-31 ENCOUNTER — Other Ambulatory Visit: Payer: Self-pay | Admitting: Family Medicine

## 2018-09-08 ENCOUNTER — Ambulatory Visit (INDEPENDENT_AMBULATORY_CARE_PROVIDER_SITE_OTHER): Payer: PRIVATE HEALTH INSURANCE | Admitting: Family Medicine

## 2018-09-08 ENCOUNTER — Other Ambulatory Visit: Payer: Self-pay

## 2018-09-08 DIAGNOSIS — G6289 Other specified polyneuropathies: Secondary | ICD-10-CM | POA: Diagnosis not present

## 2018-09-08 DIAGNOSIS — G629 Polyneuropathy, unspecified: Secondary | ICD-10-CM | POA: Insufficient documentation

## 2018-09-08 DIAGNOSIS — I10 Essential (primary) hypertension: Secondary | ICD-10-CM

## 2018-09-08 DIAGNOSIS — E782 Mixed hyperlipidemia: Secondary | ICD-10-CM

## 2018-09-08 NOTE — Assessment & Plan Note (Signed)
Encouraged to check vitals weekly and record and notify us if numbers worsen. no changes to meds. Encouraged heart healthy diet such as the DASH diet and exercise as tolerated.

## 2018-09-08 NOTE — Assessment & Plan Note (Signed)
He notes sugars well controlled with generally 70s to 120s mostly but has had some 50s and even 40s. He feels fine at 50 but shakey in 64s. This is rare. He takes Levemir 10 units if his numbers continue to drop into the 40s he should drop his Levemir to 8 units. Eat small frequent meals with lean proteins and minimizing simple carbohydrates

## 2018-09-08 NOTE — Assessment & Plan Note (Signed)
Check labs and start gabapentin 100 mg tid

## 2018-09-08 NOTE — Assessment & Plan Note (Signed)
Tolerating statin, encouraged heart healthy diet, avoid trans fats, minimize simple carbs and saturated fats. Increase exercise as tolerated 

## 2018-09-08 NOTE — Progress Notes (Signed)
Virtual Visit via Telephone Note  I connected with Scott Adkins on 09/08/18 at  3:20 PM EDT by a phone enabled telemedicine application and verified that I am speaking with the correct person using two identifiers.  Location: Patient: home Provider: office   I discussed the limitations of evaluation and management by telemedicine and the availability of in person appointments. The patient expressed understanding and agreed to proceed. Crissie Sickles, CMA attempted to get patient set up on video visit but was unable to complete so was completed via telephone.     Subjective:    Patient ID: Scott Adkins, male    DOB: 1960/07/28, 58 y.o.   MRN: [id number]  No chief complaint on file.   HPI Patient is in today for follow up on chronic medical concerns including diabetes, hyperlipidemia, hypertension and more. He reports his blood sugars are generally well controlled but does note infrequent numbers down into the 40s and his highest number is 297. No polyuria or polydipsia. His greatest concern is burning on the bottoms of both of his feet which has been present for months but is worse in the last 1-2 months. Does walk 25, 000 steps a day at work. The pain occurs all day every day but it is worse after a long day of work. Denies CP/palp/SOB/HA/congestion/fevers/GI or GU c/o. Taking meds as prescribed  Past Medical History:  Diagnosis Date  . Anemia 12/22/2015  . Arthritis   . Chicken pox as a child  . Constipation 12/22/2016  . Diabetes mellitus type I (HCC)    uses insulin pump// managed by Dr. Casimiro Needle Altheimer  . 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 Hyperlipidemia   . Hypertension   . Injury of right rotator cuff 08/16/2012   Has been seen by [facility]  . Left flank pain 09/12/2015  . Mumps as a child  . Pain in joint, lower leg 01/24/2016  . Preventative health care 02/19/2013  . Sun-damaged skin 12/06/2015    Past Surgical History:   Procedure Laterality Date  . insulin pump     placed and removed  . REFRACTIVE SURGERY Right 2007   [facility]  . ROTATOR CUFF REPAIR  2004   left    Family History  Problem Relation Age of Onset  . Hypertension Mother   . Stroke Father   . Hypertension Father   . Hyperlipidemia Father   . Hypertension Brother   . Hyperlipidemia Brother   . Mental illness Brother        depression  . Heart disease Maternal Grandmother        MI  . Heart disease Maternal Grandfather        MI  . Hyperlipidemia Brother   . Hypertension Brother   . Colon cancer Neg Hx   . Prostate cancer Neg Hx   . Breast cancer Neg Hx   . Rectal cancer Neg Hx   . Diabetes Neg Hx     Social History   Socioeconomic History  . Marital status: Married    Spouse name: Not on file  . Number of children: 3  . Years of education: Not on file  . Highest education level: Not on file  Occupational History  . Occupation: welder  Social Needs  . Financial resource strain: Not on file  . Food insecurity:    Worry: Not on file    Inability: Not on file  . Transportation needs:    Medical:  Not on file    Non-medical: Not on file  Tobacco Use  . Smoking status: Never Smoker  . Smokeless tobacco: Never Used  Substance and Sexual Activity  . Alcohol use: No    Alcohol/week: 1.0 standard drinks    Types: 1 Shots of liquor per week  . Drug use: No  . Sexual activity: Not on file    Comment: lives with wife and mother in law, no dietary restrictions  Lifestyle  . Physical activity:    Days per week: Not on file    Minutes per session: Not on file  . Stress: Not on file  Relationships  . Social connections:    Talks on phone: Not on file    Gets together: Not on file    Attends religious service: Not on file    Active member of club or organization: Not on file    Attends meetings of clubs or organizations: Not on file    Relationship status: Not on file  . Intimate partner violence:    Fear of  current or ex partner: Not on file    Emotionally abused: Not on file    Physically abused: Not on file    Forced sexual activity: Not on file  Other Topics Concern  . Not on file  Social History Narrative   Married 14 yrs   2 daughters; 1 son   Occupation: welder       Outpatient Medications Prior to Visit  Medication Sig Dispense Refill  . aspirin 81 MG tablet Take 81 mg by mouth daily.    Marland Kitchen atorvastatin (LIPITOR) 20 MG tablet Take 1 tablet (20 mg total) by mouth daily. 90 tablet 3  . cetirizine (ZYRTEC) 10 MG tablet Take 1 tablet (10 mg total) by mouth daily as needed for allergies. 30 tablet 5  . Cholecalciferol (VITAMIN D3) 1000 UNITS CAPS Take 2,000 Units by mouth daily.    . clotrimazole (CLOTRIMAZOLE ANTI-FUNGAL) 1 % cream Apply 1 application topically 2 (two) times daily. 113 g 03  . Ferrous Fumarate (HEMOCYTE) 324 (106 Fe) MG TABS tablet Take 1 tablet (106 mg of iron total) by mouth daily. 30 tablet 3  . fluticasone (FLONASE) 50 MCG/ACT nasal spray Place 2 sprays into both nostrils daily. 48 g 2  . fluticasone (FLONASE) 50 MCG/ACT nasal spray Place 2 sprays into both nostrils daily. 16 g 5  . Insulin Pen Needle (PEN NEEDLES) 29G X MISC Use as directed 100 each 3  . LEVEMIR 100 UNIT/ML injection INJECT 10 UNITS SUBCUTANEOUSLY AT BEDTIME 10 mL 0  . metoprolol succinate (TOPROL-XL) 100 MG 24 hr tablet Take 1 tablet (100 mg total) by mouth daily. Take with or immediately following a meal. 90 tablet 1  . NOVOLOG 100 UNIT/ML injection INJECT 7-20 UNITS SUBCUTANEOUSLY THREE TIMES DAILY WITH MEALS 40 mL 0   No facility-administered medications prior to visit.     Allergies  Allergen Reactions  . Lisinopril     Hyperkalemia   . Viagra [Sildenafil Citrate] Other (See Comments)    headache    Review of Systems  Constitutional: Negative for fever and malaise/fatigue.  HENT: Negative for congestion.   Eyes: Negative for blurred vision.  Respiratory: Negative for  shortness of breath.   Cardiovascular: Negative for chest pain, palpitations and leg swelling.  Gastrointestinal: Negative for abdominal pain, blood in stool and nausea.  Genitourinary: Negative for dysuria and frequency.  Musculoskeletal: Positive for joint pain. Negative for falls.  Skin:  Negative for rash.  Neurological: Positive for tingling. Negative for dizziness, loss of consciousness and headaches.  Endo/Heme/Allergies: Negative for environmental allergies.  Psychiatric/Behavioral: Negative for depression. The patient is not nervous/anxious.        Objective:    Physical Exam unable to obtain via teelphone  There were no vitals taken for this visit. Wt Readings from Last 3 Encounters:  06/09/18 205 lb 6.4 oz (93.2 kg)  01/24/18 194 lb (88 kg)  01/13/18 197 lb 9.6 oz (89.6 kg)    Diabetic Foot Exam - Simple   No data filed     Lab Results  Component Value Date   WBC 8.0 06/09/2018   HGB 12.9 (L) 06/09/2018   HCT 38.7 (L) 06/09/2018   PLT 197.0 06/09/2018   GLUCOSE 88 06/09/2018   CHOL 255 (H) 06/09/2018   TRIG 133.0 06/09/2018   HDL 58.80 06/09/2018   LDLDIRECT 71 09/10/2008   LDLCALC 170 (H) 06/09/2018   ALT 25 06/09/2018   AST 31 06/09/2018   NA 140 06/09/2018   K 4.7 06/09/2018   CL 103 06/09/2018   CREATININE 0.94 06/09/2018   BUN 17 06/09/2018   CO2 30 06/09/2018   TSH 3.15 06/09/2018   PSA 0.38 10/14/2017   HGBA1C 7.2 (H) 06/09/2018   MICROALBUR <0.7 02/15/2015    Lab Results  Component Value Date   TSH 3.15 06/09/2018   Lab Results  Component Value Date   WBC 8.0 06/09/2018   HGB 12.9 (L) 06/09/2018   HCT 38.7 (L) 06/09/2018   MCV 89.1 06/09/2018   PLT 197.0 06/09/2018   Lab Results  Component Value Date   NA 140 06/09/2018   K 4.7 06/09/2018   CO2 30 06/09/2018   GLUCOSE 88 06/09/2018   BUN 17 06/09/2018   CREATININE 0.94 06/09/2018   BILITOT 0.5 06/09/2018   ALKPHOS 66 06/09/2018   AST 31 06/09/2018   ALT 25 06/09/2018    PROT 6.6 06/09/2018   ALBUMIN 4.2 06/09/2018   CALCIUM 9.5 06/09/2018   GFR 82.44 06/09/2018   Lab Results  Component Value Date   CHOL 255 (H) 06/09/2018   Lab Results  Component Value Date   HDL 58.80 06/09/2018   Lab Results  Component Value Date   LDLCALC 170 (H) 06/09/2018   Lab Results  Component Value Date   TRIG 133.0 06/09/2018   Lab Results  Component Value Date   CHOLHDL 4 06/09/2018   Lab Results  Component Value Date   HGBA1C 7.2 (H) 06/09/2018       Assessment & Plan:   Problem List Items Addressed This Visit    Hyperlipidemia    Tolerating statin, encouraged heart healthy diet, avoid trans fats, minimize simple carbs and saturated fats. Increase exercise as tolerated      Essential hypertension    Encouraged to check vitals weekly and record and notify us if numbers worsen. no changes to meds. Encouraged heart healthy diet such as the DASH diet and exercise as tolerated.       Peripheral neuropathy    Check labs and start gabapentin 100 mg tid         I am having Scott Leylandonald Douse "Ron" maintain his Vitamin D3, aspirin, fluticasone, Ferrous Fumarate, clotrimazole, fluticasone, cetirizine, Pen Needles, metoprolol succinate, atorvastatin, NovoLOG, and Levemir.  No orders of the defined types were placed in this encounter.    I discussed the assessment and treatment plan with the patient. The patient was provided an opportunity  to ask questions and all were answered. The patient agreed with the plan and demonstrated an understanding of the instructions.   The patient was advised to call back or seek an in-person evaluation if the symptoms worsen or if the condition fails to improve as anticipated.  I provided 25 minutes of non-face-to-face time during this encounter.   Penni Homans, MD

## 2018-09-27 ENCOUNTER — Telehealth: Payer: Self-pay

## 2018-09-27 DIAGNOSIS — E103292 Type 1 diabetes mellitus with mild nonproliferative diabetic retinopathy without macular edema, left eye: Secondary | ICD-10-CM

## 2018-09-27 DIAGNOSIS — I10 Essential (primary) hypertension: Secondary | ICD-10-CM

## 2018-09-27 DIAGNOSIS — E782 Mixed hyperlipidemia: Secondary | ICD-10-CM

## 2018-09-27 NOTE — Telephone Encounter (Signed)
Copied from [place] [id number]. Topic: Appointment Scheduling - Scheduling Inquiry for Clinic >> Sep 27, 2018  3:19 PM Mathis Bud wrote: Reason for CRM: patient is requesting to get labs done, patient states he needs his A1C and blood sugar. Call back # [phone]

## 2018-09-27 NOTE — Telephone Encounter (Signed)
Please advise-orders not in.

## 2018-09-28 NOTE — Telephone Encounter (Signed)
Please order basic 4 with hgba1c and arrange lab appt

## 2018-09-29 ENCOUNTER — Other Ambulatory Visit: Payer: Self-pay | Admitting: Family Medicine

## 2018-10-03 NOTE — Telephone Encounter (Signed)
Orders placed  Lab appt made

## 2018-10-05 ENCOUNTER — Other Ambulatory Visit (INDEPENDENT_AMBULATORY_CARE_PROVIDER_SITE_OTHER): Payer: PRIVATE HEALTH INSURANCE

## 2018-10-05 ENCOUNTER — Other Ambulatory Visit: Payer: Self-pay

## 2018-10-05 DIAGNOSIS — I10 Essential (primary) hypertension: Secondary | ICD-10-CM | POA: Diagnosis not present

## 2018-10-05 DIAGNOSIS — E782 Mixed hyperlipidemia: Secondary | ICD-10-CM

## 2018-10-05 DIAGNOSIS — E103292 Type 1 diabetes mellitus with mild nonproliferative diabetic retinopathy without macular edema, left eye: Secondary | ICD-10-CM | POA: Diagnosis not present

## 2018-10-06 LAB — COMPREHENSIVE METABOLIC PANEL
ALT: 25 U/L (ref 0–53)
AST: 22 U/L (ref 0–37)
Albumin: 4.1 g/dL (ref 3.5–5.2)
Alkaline Phosphatase: 67 U/L (ref 39–117)
BUN: 30 mg/dL — ABNORMAL HIGH (ref 6–23)
CO2: 31 mEq/L (ref 19–32)
Calcium: 9.3 mg/dL (ref 8.4–10.5)
Chloride: 104 mEq/L (ref 96–112)
Creatinine, Ser: 1.02 mg/dL (ref 0.40–1.50)
GFR: 74.94 mL/min (ref >60.00)
Glucose, Bld: 53 mg/dL — ABNORMAL LOW (ref 70–99)
Potassium: 4.7 mEq/L (ref 3.5–5.1)
Sodium: 141 mEq/L (ref 135–145)
Total Bilirubin: 0.4 mg/dL (ref 0.2–1.2)
Total Protein: 6.8 g/dL (ref 6.0–8.3)

## 2018-10-06 LAB — LIPID PANEL
Cholesterol: 149 mg/dL (ref 0–200)
HDL: 47.9 mg/dL (ref >39.00)
LDL Cholesterol: 84 mg/dL (ref 0–99)
NonHDL: 101.29
Total CHOL/HDL Ratio: 3
Triglycerides: 86 mg/dL (ref 0.0–149.0)
VLDL: 17.2 mg/dL (ref 0.0–40.0)

## 2018-10-06 LAB — TSH: TSH: 3.44 u[IU]/mL (ref 0.35–4.50)

## 2018-10-06 LAB — CBC
HCT: 39.2 % (ref 39.0–52.0)
Hemoglobin: 12.7 g/dL — ABNORMAL LOW (ref 13.0–17.0)
MCHC: 32.4 g/dL (ref 30.0–36.0)
MCV: 90.4 fl (ref 78.0–100.0)
Platelets: 175 10*3/uL (ref 150.0–400.0)
RBC: 4.34 Mil/uL (ref 4.22–5.81)
RDW: 12.7 % (ref 11.5–15.5)
WBC: 8.5 10*3/uL (ref 4.0–10.5)

## 2018-10-06 LAB — HEMOGLOBIN A1C: Hgb A1c MFr Bld: 7.1 % — ABNORMAL HIGH (ref 4.6–6.5)

## 2018-10-12 ENCOUNTER — Other Ambulatory Visit: Payer: Self-pay | Admitting: Family Medicine

## 2018-10-13 ENCOUNTER — Encounter: Payer: Self-pay | Admitting: Medical

## 2018-10-13 ENCOUNTER — Ambulatory Visit: Payer: PRIVATE HEALTH INSURANCE | Admitting: Medical

## 2018-10-13 ENCOUNTER — Encounter: Payer: Self-pay | Admitting: Family Medicine

## 2018-10-13 ENCOUNTER — Other Ambulatory Visit: Payer: Self-pay

## 2018-10-13 VITALS — BP 138/70 | HR 50 | Temp 98.1°F | Resp 16 | Ht 68.0 in | Wt 201.6 lb

## 2018-10-13 DIAGNOSIS — L600 Ingrowing nail: Secondary | ICD-10-CM | POA: Diagnosis not present

## 2018-10-13 MED ORDER — CEPHALEXIN 500 MG PO CAPS: 500.0000 mg | ORAL_CAPSULE | Freq: Two times a day (BID) | ORAL | 0 refills | Status: DC

## 2018-10-13 NOTE — Patient Instructions (Signed)
You do appear to have mild ingrown left great toenail with probable early infection.  I do think Keflex antibiotic would be beneficial and advised to do warm salt water soaks twice daily.  For pain you could use Tylenol.  If that is not adequate then recommend trying low-dose ibuprofen 200 to 400 mg every 8 hours.  However advised not to use higher doses since your blood pressure is little bit borderline today.  Recommend not manipulating or cutting nail presently.  Would like you to update me on Monday afternoon by my chart.  Palpate the area and let me know if any tenderness is still present or if the area still is red.  If symptoms are persisting then I would refer you to a podiatrist for possible excision of toenail portion.  If signs/symptoms resolve then I think you would be good to go and would only recommend continuing antibiotics until finished a full course.

## 2018-10-13 NOTE — Progress Notes (Signed)
Subjective:    Patient ID: Scott Adkins, male    DOB: 24-Jan-1961, 58 y.o.   MRN: [id number]  HPI  Pt in for left great toe medial aspect pain edge of nail. Pain has been on and off pain for 3 months. No yellow dc from edge of nail. States hurts to wear his work boot with nail swollen. Worse over past week.  No fever or chills.  Pt in past has had transient ingrown toenails that resolved by itself but never had portion of the toenail removed.  Pt is diabetic. A1c was 7.1 8 days ago.    Review of Systems  Constitutional: Negative for chills, fatigue and fever.  Respiratory: Negative for cough, chest tightness, shortness of breath and wheezing.   Cardiovascular: Negative for chest pain and palpitations.  Gastrointestinal: Negative for abdominal pain.  Musculoskeletal: Negative for back pain.  Skin: Negative for rash.       Left great toe medial aspect some redness and tenderness. Pain.  Hematological: Negative for adenopathy. Does not bruise/bleed easily.  Psychiatric/Behavioral: Negative for behavioral problems and suicidal ideas. The patient is not nervous/anxious.     Past Medical History:  Diagnosis Date  . Anemia 12/22/2015  . Arthritis   . Chicken pox as a child  . Constipation 12/22/2016  . Diabetes mellitus type I ([place])    uses insulin pump// managed by Dr. Legrand Como [place]  . DM (diabetes mellitus), type 1 with ophthalmic complications ([place]) 09/07/8451  . Hyperlipidemia   . Hypertension   . Injury of right rotator cuff 08/16/2012   Has been seen by [facility]  . Left flank pain 09/12/2015  . Mumps as a child  . Pain in joint, lower leg 01/24/2016  . Preventative health care 02/19/2013  . Sun-damaged skin 12/06/2015     Social History   Socioeconomic History  . Marital status: Married    Spouse name: Not on file  . Number of children: 3  . Years of education: Not on file  . Highest education level: Not on file  Occupational History  . Occupation: welder  Social  Needs  . Financial resource strain: Not on file  . Food insecurity    Worry: Not on file    Inability: Not on file  . Transportation needs    Medical: Not on file    Non-medical: Not on file  Tobacco Use  . Smoking status: Never Smoker  . Smokeless tobacco: Never Used  Substance and Sexual Activity  . Alcohol use: No    Alcohol/week: 1.0 standard drinks    Types: 1 Shots of liquor per week  . Drug use: No  . Sexual activity: Not on file    Comment: lives with wife and mother in law, no dietary restrictions  Lifestyle  . Physical activity    Days per week: Not on file    Minutes per session: Not on file  . Stress: Not on file  Relationships  . Social Herbalist on phone: Not on file    Gets together: Not on file    Attends religious service: Not on file    Active member of club or organization: Not on file    Attends meetings of clubs or organizations: Not on file    Relationship status: Not on file  . Intimate partner violence    Fear of current or ex partner: Not on file    Emotionally abused: Not on file    Physically abused:  Not on file    Forced sexual activity: Not on file  Other Topics Concern  . Not on file  Social History Narrative   Married 14 yrs   2 daughters; 1 son   Occupation: Psychologist, occupationalwelder       Past Surgical History:  Procedure Laterality Date  . insulin pump     placed and removed  . REFRA[facility] SURGERY Right [facility]  . ROTATOR CUFF REPAIR  2004   left    Family History  Problem Relation Age of Onset  . Hypertension Mother   . Stroke Father   . Hypertension Father   . Hyperlipidemia Father   . Hypertension Brother   . Hyperlipidemia Brother   . Mental illness Brother        depression  . Heart disease Maternal Grandmother        MI  . Heart disease Maternal Grandfather        MI  . Hyperlipidemia Brother   . Hypertension Brother   . Colon cancer Neg Hx   . Prostate cancer Neg Hx   . Breast cancer Neg Hx   .  Rectal cancer Neg Hx   . Diabetes Neg Hx     Allergies  Allergen Reactions  . Lisinopril     Hyperkalemia   . Viagra [Sildenafil Citrate] Other (See Comments)    headache    Current Outpatient Medications on File Prior to Visit  Medication Sig Dispense Refill  . aspirin 81 MG tablet Take 81 mg by mouth daily.    Marland Kitchen. atorvastatin (LIPITOR) 20 MG tablet Take 1 tablet (20 mg total) by mouth daily. 90 tablet 3  . cetirizine (ZYRTEC) 10 MG tablet Take 1 tablet (10 mg total) by mouth daily as needed for allergies. 30 tablet 5  . Cholecalciferol (VITAMIN D3) 1000 UNITS CAPS Take 2,000 Units by mouth daily.    . clotrimazole (CLOTRIMAZOLE ANTI-FUNGAL) 1 % cream Apply 1 application topically 2 (two) times daily. 113 g 03  . Ferrous Fumarate (HEMOCYTE) 324 (106 Fe) MG TABS tablet Take 1 tablet (106 mg of iron total) by mouth daily. 30 tablet 3  . fluticasone (FLONASE) 50 MCG/ACT nasal spray Place 2 sprays into both nostrils daily. 48 g 2  . fluticasone (FLONASE) 50 MCG/ACT nasal spray Place 2 sprays into both nostrils daily. 16 g 5  . Insulin Pen Needle (PEN NEEDLES) 29G X 12MM MISC Use as directed 100 each 3  . LEVEMIR 100 UNIT/ML injection INJECT 10 UNITS SUBCUTANEOUSLY AT BEDTIME 10 mL 0  . metoprolol succinate (TOPROL-XL) 100 MG 24 hr tablet Take 1 tablet (100 mg total) by mouth daily. Take with or immediately following a meal. 90 tablet 1  . NOVOLOG 100 UNIT/ML injection INJECT 7 TO 20 UNITS SUBCUTANEOUSLY THREE TIMES DAILY WITH MEALS 40 mL 0   No current facility-administered medications on file prior to visit.     BP (!) 145/64   Pulse (!) 50   Temp 98.1 F (36.7 C) (Oral)   Resp 16   Ht 5\' 8"  (1.727 m)   Wt 201 lb 9.6 oz (91.4 kg)   SpO2 98%   BMI 30.65 kg/m       Objective:   Physical Exam  General- No acute distress. Pleasant patient. Neck- Full range of motion, no jvd Lungs- Clear, even and unlabored. Heart- regular rate and rhythm. Neurologic- CNII- XII grossly  intact. Left foot- left great toe- medial aspect red and tender.  Mild ingrown.                 Assessment & Plan:  You do appear to have mild ingrown left great toenail with probable early infection.  I do think Keflex antibiotic would be beneficial and advised to do warm salt water soaks twice daily.  For pain you could use Tylenol.  If that is not adequate then recommend trying low-dose ibuprofen 200 to 400 mg every 8 hours.  However advised not to use higher doses since your blood pressure is little bit borderline today.  Recommend not manipulating or cutting nail presently.  Would like you to update me on Monday afternoon by my chart.  Palpate the area and let me know if any tenderness is still present or if the area still is red.  If symptoms are persisting then I would refer you to a podiatrist for possible excision of toenail portion.  If signs/symptoms resolve then I think you would be good to go and would only recommend continuing antibiotics until finished a full course.  Esperanza RichtersEdward Arshi Duarte, PA-C

## 2018-10-18 ENCOUNTER — Other Ambulatory Visit: Payer: Self-pay | Admitting: Family Medicine

## 2018-10-18 ENCOUNTER — Telehealth: Payer: Self-pay | Admitting: Medical

## 2018-10-18 DIAGNOSIS — L600 Ingrowing nail: Secondary | ICD-10-CM

## 2018-10-18 NOTE — Telephone Encounter (Signed)
I have placed the referral to podiatry

## 2018-10-18 NOTE — Telephone Encounter (Signed)
Please advise 

## 2018-10-18 NOTE — Telephone Encounter (Signed)
Pt has OV 7/9 with Percell Miller. Would he place a referral for removing toenail?  Copied from [place] [phone]. Topic: General - Other >> Oct 17, 2018  5:04 PM Mcneil, Ja-Kwan wrote: Reason for CRM: Pt stated he is still experiencing pain from the ingrown toenail. Pt stated he needs to see someone that can remove the toenail. Pt requests call back to schedule or advise of a referral to have this done.

## 2018-11-21 ENCOUNTER — Other Ambulatory Visit: Payer: Self-pay | Admitting: Family Medicine

## 2018-12-21 ENCOUNTER — Other Ambulatory Visit: Payer: Self-pay | Admitting: Family Medicine

## 2018-12-22 ENCOUNTER — Ambulatory Visit: Payer: Managed Care, Other (non HMO) | Admitting: Family Medicine

## 2018-12-23 ENCOUNTER — Telehealth: Payer: Self-pay | Admitting: Family Medicine

## 2018-12-23 NOTE — Telephone Encounter (Signed)
Medication Refill - Medication: insulin  Has the patient contacted their pharmacy? No. Pt called stating his new insurance will not cover novolog. Pt is requesting anoter medication be sent in as he does not have enough of this medication to last. Please advise.  (Agent: If no, request that the patient contact the pharmacy for the refill.) (Agent: If yes, when and what did the pharmacy advise?)  Preferred Pharmacy (with phone number or street name):  [id number] - [place], [place] - [address]  [address] [place] [address]  Phone: [phone] Fax: [phone]  Not a 24 hour pharmacy; exact hours not known.    Agent: Please be advised that RX refills may take up to 3 business days. We ask that you follow-up with your pharmacy.

## 2018-12-23 NOTE — Telephone Encounter (Signed)
I assume if they will not pay for Novolog they will pay for humalog so d/c the Novolog and start Humalog with same sig 7-20 units tid with meals

## 2018-12-26 MED ORDER — INSULIN LISPRO 100 UNIT/ML ~~LOC~~ SOLN: SUBCUTANEOUS | 11 refills | Status: DC

## 2018-12-26 NOTE — Telephone Encounter (Signed)
Resent medications in for patient. Unable to leave voicemail for patient
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
## 2018-12-26 NOTE — Addendum Note (Signed)
Addended by: Magdalene Molly A on: 12/26/2018 01:43 PM   Modules accepted: Orders

## 2018-12-26 NOTE — Telephone Encounter (Signed)
I just received a note saying he could not have Novolog not that they will accept a PA. Please look into what they say has to happen to get him his Novolog. OK to put on rx failed Humalog.

## 2018-12-26 NOTE — Telephone Encounter (Signed)
Sent medications in to pharmacy  Left message for patient

## 2018-12-28 ENCOUNTER — Other Ambulatory Visit: Payer: Self-pay | Admitting: Family Medicine

## 2018-12-28 NOTE — Telephone Encounter (Signed)
Pt called and updated insurance info and stated that he is waiting for PA to get his insulin. Please advise.   [employer] ID # [id number]

## 2018-12-28 NOTE — Telephone Encounter (Signed)
Spoke w/ Pt-informed that per insurance PA not needed- recommend he discuss w/ his pharmacy and have them call insurance directly. He is out of Humalog completely- we do have samples- he is on his way to office now to get.

## 2018-12-28 NOTE — Telephone Encounter (Signed)
Patient calling to get an update on whether his insurance would cover his isulin medications. States that he called and gave his new information today. Advised of message below and he would like a call back to discuss whether this information was obtained before or after he gave his new insurance information. Please advise.  CB#: [phone]

## 2018-12-28 NOTE — Telephone Encounter (Signed)
Both Humalog and Novolog are fast acting insulins and have the potential to drop blood sugars quickly. However, will attempt PA. KEY: [id number]. Per Covermymeds PA not needed. Med already covered under plan.   Drug is covered by current benefit plan. No further PA activity needed

## 2018-12-29 ENCOUNTER — Encounter: Payer: Self-pay | Admitting: *Deleted

## 2018-12-29 MED ORDER — INSULIN ASPART 100 UNIT/ML ~~LOC~~ SOLN: SUBCUTANEOUS | 1 refills | Status: DC

## 2018-12-29 NOTE — Addendum Note (Signed)
Addended by: Kem Boroughs D on: 12/29/2018 05:01 PM   Modules accepted: Orders

## 2018-12-29 NOTE — Telephone Encounter (Signed)
Stephanie at [facility] has some samples for patient.  Patient will go pickup tomorrow after 1pm.  I will continue to check on insurance and call and update patient tomorrow.

## 2018-12-29 NOTE — Telephone Encounter (Addendum)
Per Dr. Charlett Blake patient can take Humalog at a lower dose starting with 5 units and slowly go up or we can refer to endocrinology.  Patient stated that he would rather keep taking what he has been taking.  He is willing to purchase insulin with out insurance.  Advise him to await to end of day to see if insurance can approve.

## 2018-12-29 NOTE — Telephone Encounter (Signed)
Pt came to pick up Humalog samples yesterday afternoon- Pt actually needs Novolog. Will need to run another PA. Unfortunately we do not have samples for Novolog.

## 2018-12-29 NOTE — Telephone Encounter (Addendum)
Patient came into office for samples and he was told that we had some and to come pickup because we had started a prior authorization because it is not covered by insurance and patient cannot take alternative..  When patient got here he was given Humalog.  He stated that he is unable to take Humalog because it gives him problems.  As seen in previous note it drops his sugar too low.   He stated that he needed Novalog.  We do not have any Novolog samples.  Advised patient that he could purchase a vial from the pharmacy generic $173.  We also tried to see if another office had a sample.  So around 550pm Anderson [place] front office supervisor and patient went over to see if [place] office had any samples to give and did not.    This morning I checked with insurance Christella Scheuermann [phone])  since we sent the PA for flexpens instead of vials.  I spoke with DJ and she stated that she saw the PA and added a note that the PA is suppose to be for vials.    As of right we are still awaiting determination of prior authorization.  Patient has been notified and updated that we are still awaiting determination of the prior authorization and I will call him back after lunch for an updated or sooner if we get an approval.

## 2018-12-30 ENCOUNTER — Other Ambulatory Visit: Payer: Self-pay | Admitting: Family Medicine

## 2019-01-04 NOTE — Telephone Encounter (Signed)
Received a call from [facility] about Novolog vials.  On the first denial it was for the pens.  Hopefully we may get another chance with prior authorization.  I gave them some clinical information and it was sent to review again.  I spoke with patient and advised him that they called [place] and that we redid the prior auth, but he still needs to have virtual with Dr. Charlett Blake tomorrow for that documentation.    Ref number for new authorization is [id number].    Advised patient that I will speak with him on Monday after I call to get insurance to send and email to review to speed up process as Christella Scheuermann said it will be about 5 days.

## 2019-01-04 NOTE — Telephone Encounter (Signed)
Pt calling back to check status. Please advise  °
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
## 2019-01-04 NOTE — Telephone Encounter (Signed)
Thanks

## 2019-01-05 ENCOUNTER — Ambulatory Visit (INDEPENDENT_AMBULATORY_CARE_PROVIDER_SITE_OTHER): Payer: Managed Care, Other (non HMO) | Admitting: Family Medicine

## 2019-01-05 ENCOUNTER — Other Ambulatory Visit: Payer: Self-pay

## 2019-01-05 DIAGNOSIS — E103292 Type 1 diabetes mellitus with mild nonproliferative diabetic retinopathy without macular edema, left eye: Secondary | ICD-10-CM

## 2019-01-05 DIAGNOSIS — I10 Essential (primary) hypertension: Secondary | ICD-10-CM | POA: Diagnosis not present

## 2019-01-08 NOTE — Progress Notes (Signed)
Virtual Visit via phone Note  I connected with Scott Leylandonald Hedges on 01/05/19 at  3:00 PM EDT by a phone enabled telemedicine application and verified that I am speaking with the correct person using two identifiers.  Location: Patient: home Provider: office   I discussed the limitations of evaluation and management by telemedicine and the availability of in person appointments. The patient expressed understanding and agreed to proceed. Princess Montez MoritaCarter CMA was able to get the patient wet up on visit, phone after being unable to obtain video visit   Subjective:    Patient ID: Scott Adkins, male    DOB: [birth year], 58 y.o.   MRN: [id number]  Chief Complaint  Patient presents with  . Diabetes    to discuss insulin    HPI Patient is in today for chronic medical concerns including diabetes and hypertension. No recent febrile illness or hospitalizations. His sugars are doing OK due to samples of Novolog we were able to obtain for him via endocrinology but he is fighting with his insurance company about them paying for the Novolog. They have tried to get him to switch to Humalog but he had seizures on that so is hesitant to go back. No c/o CP/palp/SOB/HA/congestion/fevers/GI or GU c/o. Taking meds as prescribed  Past Medical History:  Diagnosis Date  . Anemia 12/22/2015  . Arthritis   . Chicken pox as a child  . Constipation 12/22/2016  . Diabetes mellitus type I (HCC)    uses insulin pump// managed by Dr. Casimiro NeedleMichael Altheimer  . 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 Hyperlipidemia   . Hypertension   . Injury of right rotator cuff 08/16/2012   Has been seen by [facility]  . Left flank pain 09/12/2015  . Mumps as a child  . Pain in joint, lower leg 01/24/2016  . Preventative health care 02/19/2013  . Sun-damaged skin 12/06/2015    Past Surgical History:  Procedure Laterality Date  . insulin pump     placed and removed  . [facility]  . ROTATOR CUFF REPAIR  2004   left    Family History  Problem Relation Age of Onset  . Hypertension Mother   . Stroke Father   . Hypertension Father   . Hyperlipidemia Father   . Hypertension Brother   . Hyperlipidemia Brother   . Mental illness Brother        depression  . Heart disease Maternal Grandmother        MI  . Heart disease Maternal Grandfather        MI  . Hyperlipidemia Brother   . Hypertension Brother   . Colon cancer Neg Hx   . Prostate cancer Neg Hx   . Breast cancer Neg Hx   . Rectal cancer Neg Hx   . Diabetes Neg Hx     Social History   Socioeconomic History  . Marital status: Married    Spouse name: Not on file  . Number of children: 3  . Years of education: Not on file  . Highest education level: Not on file  Occupational History  . Occupation: welder  Social Needs  . Financial resource strain: Not on file  . Food insecurity    Worry: Not on file    Inability: Not on file  . Transportation needs    Medical: Not on file    Non-medical: Not on file  Tobacco Use  . Smoking  status: Never Smoker  . Smokeless tobacco: Never Used  Substance and Sexual Activity  . Alcohol use: No    Alcohol/week: 1.0 standard drinks    Types: 1 Shots of liquor per week  . Drug use: No  . Sexual activity: Not on file    Comment: lives with wife and mother in law, no dietary restrictions  Lifestyle  . Physical activity    Days per week: Not on file    Minutes per session: Not on file  . Stress: Not on file  Relationships  . Social Musician on phone: Not on file    Gets together: Not on file    Attends religious service: Not on file    Active member of club or organization: Not on file    Attends meetings of clubs or organizations: Not on file    Relationship status: Not on file  . Intimate partner violence    Fear of current or ex partner: Not on file    Emotionally abused: Not on file    Physically abused: Not on file     Forced sexual activity: Not on file  Other Topics Concern  . Not on file  Social History Narrative   Married 14 yrs   2 daughters; 1 son   Occupation: welder       Outpatient Medications Prior to Visit  Medication Sig Dispense Refill  . aspirin 81 MG tablet Take 81 mg by mouth daily.    Marland Kitchen atorvastatin (LIPITOR) 20 MG tablet Take 1 tablet (20 mg total) by mouth daily. 90 tablet 3  . cephALEXin (KEFLEX) 500 MG capsule Take 1 capsule (500 mg total) by mouth 2 (two) times daily. 14 capsule 0  . cetirizine (ZYRTEC) 10 MG tablet Take 1 tablet (10 mg total) by mouth daily as needed for allergies. 30 tablet 5  . Cholecalciferol (VITAMIN D3) 1000 UNITS CAPS Take 2,000 Units by mouth daily.    . clotrimazole (CLOTRIMAZOLE ANTI-FUNGAL) 1 % cream Apply 1 application topically 2 (two) times daily. 113 g 03  . Ferrous Fumarate (HEMOCYTE) 324 (106 Fe) MG TABS tablet Take 1 tablet (106 mg of iron total) by mouth daily. 30 tablet 3  . fluticasone (FLONASE) 50 MCG/ACT nasal spray Place 2 sprays into both nostrils daily. 48 g 2  . fluticasone (FLONASE) 50 MCG/ACT nasal spray Place 2 sprays into both nostrils daily. 16 g 5  . insulin aspart (NOVOLOG) 100 UNIT/ML injection Inject 7-20 units 3 times a day with meals 40 mL 1  . Insulin Pen Needle (PEN NEEDLES) 29G X MISC Use as directed 100 each 3  . LEVEMIR 100 UNIT/ML injection INJECT 10 UNITS SUBCUTANEOUSLY AT BEDTIME 10 mL 0  . metoprolol succinate (TOPROL-XL) 100 MG 24 hr tablet Take 1 tablet (100 mg total) by mouth daily. Take with or immediately following a meal. 90 tablet 1   No facility-administered medications prior to visit.     Allergies  Allergen Reactions  . Humalog [Insulin Lispro] Other (See Comments)    Sugar drops dangerously low   . Lisinopril     Hyperkalemia   . Viagra [Sildenafil Citrate] Other (See Comments)    headache    Review of Systems  Constitutional: Negative for fever and malaise/fatigue.  HENT: Negative for  congestion.   Eyes: Negative for blurred vision.  Respiratory: Negative for shortness of breath.   Cardiovascular: Negative for chest pain, palpitations and leg swelling.  Gastrointestinal: Negative for abdominal  pain, blood in stool and nausea.  Genitourinary: Negative for dysuria and frequency.  Musculoskeletal: Negative for falls.  Skin: Negative for rash.  Neurological: Negative for dizziness, loss of consciousness and headaches.  Endo/Heme/Allergies: Negative for environmental allergies.  Psychiatric/Behavioral: Negative for depression. The patient is not nervous/anxious.        Objective:    Physical Exam unable to obtain via phone  There were no vitals taken for this visit. Wt Readings from Last 3 Encounters:  10/13/18 201 lb 9.6 oz (91.4 kg)  06/09/18 205 lb 6.4 oz (93.2 kg)  01/24/18 194 lb (88 kg)    Diabetic Foot Exam - Simple   No data filed     Lab Results  Component Value Date   WBC 8.5 10/05/2018   HGB 12.7 (L) 10/05/2018   HCT 39.2 10/05/2018   PLT 175.0 10/05/2018   GLUCOSE 53 (L) 10/05/2018   CHOL 149 10/05/2018   TRIG 86.0 10/05/2018   HDL 47.90 10/05/2018   LDLDIRECT 71 09/10/2008   LDLCALC 84 10/05/2018   ALT 25 10/05/2018   AST 22 10/05/2018   NA 141 10/05/2018   K 4.7 10/05/2018   CL 104 10/05/2018   CREATININE 1.02 10/05/2018   BUN 30 (H) 10/05/2018   CO2 31 10/05/2018   TSH 3.44 10/05/2018   PSA 0.38 10/14/2017   HGBA1C 7.1 (H) 10/05/2018   MICROALBUR <0.7 02/15/2015    Lab Results  Component Value Date   TSH 3.44 10/05/2018   Lab Results  Component Value Date   WBC 8.5 10/05/2018   HGB 12.7 (L) 10/05/2018   HCT 39.2 10/05/2018   MCV 90.4 10/05/2018   PLT 175.0 10/05/2018   Lab Results  Component Value Date   NA 141 10/05/2018   K 4.7 10/05/2018   CO2 31 10/05/2018   GLUCOSE 53 (L) 10/05/2018   BUN 30 (H) 10/05/2018   CREATININE 1.02 10/05/2018   BILITOT 0.4 10/05/2018   ALKPHOS 67 10/05/2018   AST 22 10/05/2018    ALT 25 10/05/2018   PROT 6.8 10/05/2018   ALBUMIN 4.1 10/05/2018   CALCIUM 9.3 10/05/2018   GFR 74.94 10/05/2018   Lab Results  Component Value Date   CHOL 149 10/05/2018   Lab Results  Component Value Date   HDL 47.90 10/05/2018   Lab Results  Component Value Date   LDLCALC 84 10/05/2018   Lab Results  Component Value Date   TRIG 86.0 10/05/2018   Lab Results  Component Value Date   CHOLHDL 3 10/05/2018   Lab Results  Component Value Date   HGBA1C 7.1 (H) 10/05/2018       Assessment & Plan:   Problem List Items Addressed This Visit    Essential hypertension    Has been better controlled, no changes to meds. Encouraged heart healthy diet such as the DASH diet and exercise as tolerated.       DM (diabetes mellitus), type 1 with ophthalmic complications ([place])    Has been having trouble with his insurance and they tried to force him to switch from Novolog to Humalog but he reports seizure activity while on Humalog in the past so we are awaiting their judgement on the repeat PA we submitted. He has samples for now to keep him going.          I am having Scott Gambler "Ron" maintain his Vitamin D3, aspirin, fluticasone, Ferrous Fumarate, clotrimazole, fluticasone, cetirizine, Pen Needles, metoprolol succinate, atorvastatin, cephALEXin, insulin aspart, and Levemir.  No orders of the defined types were placed in this encounter.    I discussed the assessment and treatment plan with the patient. The patient was provided an opportunity to ask questions and all were answered. The patient agreed with the plan and demonstrated an understanding of the instructions.   The patient was advised to call back or seek an in-person evaluation if the symptoms worsen or if the condition fails to improve as anticipated.  I provided 15 minutes of non-face-to-face time during this encounter.   Danise Edge, MD

## 2019-01-08 NOTE — Assessment & Plan Note (Signed)
Has been having trouble with his insurance and they tried to force him to switch from Novolog to Humalog but he reports seizure activity while on Humalog in the past so we are awaiting their judgement on the repeat PA we submitted. He has samples for now to keep him going.

## 2019-01-08 NOTE — Assessment & Plan Note (Signed)
Has been better controlled, no changes to meds. Encouraged heart healthy diet such as the DASH diet and exercise as tolerated.

## 2019-01-12 NOTE — Telephone Encounter (Signed)
I followed up today with insurance and they stated that the Novolog vials is approved.  Spoke with [facility] and they stated that it was a little over $300 for 2 months.  Patient stated that [facility] did call and he will use a discount card that will take it down to about $100 a month and that he will do that.

## 2019-01-25 ENCOUNTER — Other Ambulatory Visit: Payer: Self-pay | Admitting: Emergency Medicine

## 2019-01-25 DIAGNOSIS — I10 Essential (primary) hypertension: Secondary | ICD-10-CM

## 2019-01-25 DIAGNOSIS — E103292 Type 1 diabetes mellitus with mild nonproliferative diabetic retinopathy without macular edema, left eye: Secondary | ICD-10-CM

## 2019-01-26 ENCOUNTER — Other Ambulatory Visit (INDEPENDENT_AMBULATORY_CARE_PROVIDER_SITE_OTHER): Payer: Managed Care, Other (non HMO)

## 2019-01-26 ENCOUNTER — Ambulatory Visit: Payer: PRIVATE HEALTH INSURANCE | Admitting: Family Medicine

## 2019-01-26 ENCOUNTER — Other Ambulatory Visit: Payer: Self-pay

## 2019-01-26 ENCOUNTER — Ambulatory Visit: Payer: Managed Care, Other (non HMO)

## 2019-01-26 DIAGNOSIS — E103292 Type 1 diabetes mellitus with mild nonproliferative diabetic retinopathy without macular edema, left eye: Secondary | ICD-10-CM

## 2019-01-26 DIAGNOSIS — I10 Essential (primary) hypertension: Secondary | ICD-10-CM

## 2019-01-26 LAB — COMPREHENSIVE METABOLIC PANEL
ALT: 24 U/L (ref 0–53)
AST: 23 U/L (ref 0–37)
Albumin: 4.5 g/dL (ref 3.5–5.2)
Alkaline Phosphatase: 79 U/L (ref 39–117)
BUN: 22 mg/dL (ref 6–23)
CO2: 29 mEq/L (ref 19–32)
Calcium: 9.5 mg/dL (ref 8.4–10.5)
Chloride: 100 mEq/L (ref 96–112)
Creatinine, Ser: 0.97 mg/dL (ref 0.40–1.50)
GFR: 79.33 mL/min (ref >60.00)
Glucose, Bld: 281 mg/dL — ABNORMAL HIGH (ref 70–99)
Potassium: 4.6 mEq/L (ref 3.5–5.1)
Sodium: 137 mEq/L (ref 135–145)
Total Bilirubin: 0.7 mg/dL (ref 0.2–1.2)
Total Protein: 7 g/dL (ref 6.0–8.3)

## 2019-01-26 LAB — CBC WITH DIFFERENTIAL/PLATELET
Basophils Absolute: 0 10*3/uL (ref 0.0–0.1)
Basophils Relative: 0.4 % (ref 0.0–3.0)
Eosinophils Absolute: 0.1 10*3/uL (ref 0.0–0.7)
Eosinophils Relative: 0.8 % (ref 0.0–5.0)
HCT: 41.2 % (ref 39.0–52.0)
Hemoglobin: 13.3 g/dL (ref 13.0–17.0)
Lymphocytes Relative: 19.1 % (ref 12.0–46.0)
Lymphs Abs: 1.3 10*3/uL (ref 0.7–4.0)
MCHC: 32.2 g/dL (ref 30.0–36.0)
MCV: 90.4 fl (ref 78.0–100.0)
Monocytes Absolute: 0.5 10*3/uL (ref 0.1–1.0)
Monocytes Relative: 7.4 % (ref 3.0–12.0)
Neutro Abs: 5 10*3/uL (ref 1.4–7.7)
Neutrophils Relative %: 72.3 % (ref 43.0–77.0)
Platelets: 161 10*3/uL (ref 150.0–400.0)
RBC: 4.56 Mil/uL (ref 4.22–5.81)
RDW: 12.7 % (ref 11.5–15.5)
WBC: 6.9 10*3/uL (ref 4.0–10.5)

## 2019-01-26 LAB — HEMOGLOBIN A1C: Hgb A1c MFr Bld: 7.4 % — ABNORMAL HIGH (ref 4.6–6.5)

## 2019-02-10 ENCOUNTER — Other Ambulatory Visit: Payer: Self-pay | Admitting: Family Medicine

## 2019-03-22 ENCOUNTER — Other Ambulatory Visit: Payer: Self-pay | Admitting: Family Medicine

## 2019-03-23 NOTE — Telephone Encounter (Signed)
Last OV 01/05/19 Last refill 02/10/19 # 10 mL / 0 Next OV 04/20/19

## 2019-04-19 ENCOUNTER — Other Ambulatory Visit: Payer: Self-pay

## 2019-04-20 ENCOUNTER — Encounter: Payer: Self-pay | Admitting: Family Medicine

## 2019-04-20 ENCOUNTER — Other Ambulatory Visit: Payer: Self-pay

## 2019-04-20 ENCOUNTER — Ambulatory Visit: Payer: Managed Care, Other (non HMO) | Admitting: Family Medicine

## 2019-04-20 DIAGNOSIS — E103292 Type 1 diabetes mellitus with mild nonproliferative diabetic retinopathy without macular edema, left eye: Secondary | ICD-10-CM

## 2019-04-20 DIAGNOSIS — K047 Periapical abscess without sinus: Secondary | ICD-10-CM | POA: Diagnosis not present

## 2019-04-20 DIAGNOSIS — E782 Mixed hyperlipidemia: Secondary | ICD-10-CM

## 2019-04-20 DIAGNOSIS — I10 Essential (primary) hypertension: Secondary | ICD-10-CM | POA: Diagnosis not present

## 2019-04-20 MED ORDER — AMOXICILLIN-POT CLAVULANATE 875-125 MG PO TABS: 1.0000 | ORAL_TABLET | Freq: Two times a day (BID) | ORAL | 0 refills | Status: DC

## 2019-04-20 MED ORDER — INSULIN ASPART 100 UNIT/ML ~~LOC~~ SOLN: SUBCUTANEOUS | 1 refills | Status: DC

## 2019-04-20 MED ORDER — INSULIN DETEMIR 100 UNIT/ML ~~LOC~~ SOLN: SUBCUTANEOUS | 0 refills | Status: DC

## 2019-04-20 NOTE — Patient Instructions (Signed)
Multivitamin with minerals, selenium Vitamin D 04-1998 IU daily Aspirin 81 mg daily Probiotic daily  Melatonin 2.5-5 mg at bedtime  Carbohydrate Counting for Diabetes Mellitus, Adult  Carbohydrate counting is a method of keeping track of how many carbohydrates you eat. Eating carbohydrates naturally increases the amount of sugar (glucose) in the blood. Counting how many carbohydrates you eat helps keep your blood glucose within normal limits, which helps you manage your diabetes (diabetes mellitus). It is important to know how many carbohydrates you can safely have in each meal. This is different for every person. A diet and nutrition specialist (registered dietitian) can help you make a meal plan and calculate how many carbohydrates you should have at each meal and snack. Carbohydrates are found in the following foods:  Grains, such as breads and cereals.  Dried beans and soy products.  Starchy vegetables, such as potatoes, peas, and corn.  Fruit and fruit juices.  Milk and yogurt.  Sweets and snack foods, such as cake, cookies, candy, chips, and soft drinks. How do I count carbohydrates? There are two ways to count carbohydrates in food. You can use either of the methods or a combination of both. Reading "Nutrition Facts" on packaged food The "Nutrition Facts" list is included on the labels of almost all packaged foods and beverages in the [place] It includes:  The serving size.  Information about nutrients in each serving, including the grams (g) of carbohydrate per serving. To use the "Nutrition Facts":  Decide how many servings you will have.  Multiply the number of servings by the number of carbohydrates per serving.  The resulting number is the total amount of carbohydrates that you will be having. Learning standard serving sizes of other foods When you eat carbohydrate foods that are not packaged or do not include "Nutrition Facts" on the label, you need to measure the  servings in order to count the amount of carbohydrates:  Measure the foods that you will eat with a food scale or measuring cup, if needed.  Decide how many standard-size servings you will eat.  Multiply the number of servings by 15. Most carbohydrate-rich foods have about 15 g of carbohydrates per serving. ? For example, if you eat 8 oz (170 g) of strawberries, you will have eaten 2 servings and 30 g of carbohydrates (2 servings x 15 g = 30 g).  For foods that have more than one food mixed, such as soups and casseroles, you must count the carbohydrates in each food that is included. The following list contains standard serving sizes of common carbohydrate-rich foods. Each of these servings has about 15 g of carbohydrates:   hamburger bun or  English muffin.   oz (15 mL) syrup.   oz (14 g) jelly.  1 slice of bread.  1 six-inch tortilla.  3 oz (85 g) cooked rice or pasta.  4 oz (113 g) cooked dried beans.  4 oz (113 g) starchy vegetable, such as peas, corn, or potatoes.  4 oz (113 g) hot cereal.  4 oz (113 g) mashed potatoes or  of a large baked potato.  4 oz (113 g) canned or frozen fruit.  4 oz (120 mL) fruit juice.  4-6 crackers.  6 chicken nuggets.  6 oz (170 g) unsweetened dry cereal.  6 oz (170 g) plain fat-free yogurt or yogurt sweetened with artificial sweeteners.  8 oz (240 mL) milk.  8 oz (170 g) fresh fruit or one small piece of fruit.  24 oz (  680 g) popped popcorn. Example of carbohydrate counting Sample meal  3 oz (85 g) chicken breast.  6 oz (170 g) brown rice.  4 oz (113 g) corn.  8 oz (240 mL) milk.  8 oz (170 g) strawberries with sugar-free whipped topping. Carbohydrate calculation 1. Identify the foods that contain carbohydrates: ? Rice. ? Corn. ? Milk. ? Strawberries. 2. Calculate how many servings you have of each food: ? 2 servings rice. ? 1 serving corn. ? 1 serving milk. ? 1 serving strawberries. 3. Multiply each  number of servings by 15 g: ? 2 servings rice x 15 g = 30 g. ? 1 serving corn x 15 g = 15 g. ? 1 serving milk x 15 g = 15 g. ? 1 serving strawberries x 15 g = 15 g. 4. Add together all of the amounts to find the total grams of carbohydrates eaten: ? 30 g + 15 g + 15 g + 15 g = 75 g of carbohydrates total. Summary  Carbohydrate counting is a method of keeping track of how many carbohydrates you eat.  Eating carbohydrates naturally increases the amount of sugar (glucose) in the blood.  Counting how many carbohydrates you eat helps keep your blood glucose within normal limits, which helps you manage your diabetes.  A diet and nutrition specialist (registered dietitian) can help you make a meal plan and calculate how many carbohydrates you should have at each meal and snack. This information is not intended to replace advice given to you by your health care provider. Make sure you discuss any questions you have with your health care provider. Document Revised: 10/15/2016 Document Reviewed: 09/04/2015 Elsevier Patient Education  2020 [employer].

## 2019-04-22 DIAGNOSIS — K047 Periapical abscess without sinus: Secondary | ICD-10-CM | POA: Insufficient documentation

## 2019-04-22 NOTE — Progress Notes (Signed)
Subjective:    Patient ID: Scott Adkins, male    DOB: [birth year], 59 y.o.   MRN: [id number]  Chief Complaint  Patient presents with  . Follow-up    HPI Patient is in today for follow up on chronic medical concerns including diabetes, hyperlipidemia, hypertension and more. He is feeling well today but he is noting some dental pain in a tooth in his left lower jaw line. Gum inflammation as well. No fevers or chills. No recent hospitalizations. Notes some low blood sugars during the day when he is working because he has trouble eating regularly. His numbers can dip into 40s and 50s. No syncope. Denies CP/palp/SOB/HA/congestion/fevers/GI or GU c/o. Taking meds as prescribed  Past Medical History:  Diagnosis Date  . Anemia 12/22/2015  . Arthritis   . Chicken pox as a child  . Constipation 12/22/2016  . Diabetes mellitus type I (HCC)    uses insulin pump// managed by Dr. Casimiro Needle [place]  . 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 Hyperlipidemia   . Hypertension   . Injury of right rotator cuff 08/16/2012   Has been seen by [facility]  . Left flank pain 09/12/2015  . Mumps as a child  . Pain in joint, lower leg 01/24/2016  . Preventative health care 02/19/2013  . Sun-damaged skin 12/06/2015    Past Surgical History:  Procedure Laterality Date  . insulin pump     placed and removed  . REFRACTIVE SURGERY Right 2007   [facility]  . ROTATOR CUFF REPAIR  2004   left    Family History  Problem Relation Age of Onset  . Hypertension Mother   . Stroke Father   . Hypertension Father   . Hyperlipidemia Father   . Hypertension Brother   . Hyperlipidemia Brother   . Mental illness Brother        depression  . Heart disease Maternal Grandmother        MI  . Heart disease Maternal Grandfather        MI  . Hyperlipidemia Brother   . Hypertension Brother   . Colon cancer Neg Hx   . Prostate cancer Neg Hx   . Breast cancer Neg Hx   . Rectal  cancer Neg Hx   . Diabetes Neg Hx     Social History   Socioeconomic History  . Marital status: Married    Spouse name: Not on file  . Number of children: 3  . Years of education: Not on file  . Highest education level: Not on file  Occupational History  . Occupation: welder  Tobacco Use  . Smoking status: Never Smoker  . Smokeless tobacco: Never Used  Substance and Sexual Activity  . Alcohol use: No    Alcohol/week: 1.0 standard drinks    Types: 1 Shots of liquor per week  . Drug use: No  . Sexual activity: Not on file    Comment: lives with wife and mother in law, no dietary restrictions  Other Topics Concern  . Not on file  Social History Narrative   Married 14 yrs   2 daughters; 1 son   Occupation: Psychologist, occupational      Social Determinants of Corporate investment banker Strain:   . Difficulty of Paying Living Expenses: Not on file  Food Insecurity:   . Worried About Programme researcher, broadcasting/film/video in the Last Year: Not on file  . Ran Out of Food in the  Last Year: Not on file  Transportation Needs:   . Lack of Transportation (Medical): Not on file  . Lack of Transportation (Non-Medical): Not on file  Physical Activity:   . Days of Exercise per Week: Not on file  . Minutes of Exercise per Session: Not on file  Stress:   . Feeling of Stress : Not on file  Social Connections:   . Frequency of Communication with Friends and Family: Not on file  . Frequency of Social Gatherings with Friends and Family: Not on file  . Attends Religious Services: Not on file  . Active Member of Clubs or Organizations: Not on file  . Attends Banker Meetings: Not on file  . Marital Status: Not on file  Intimate Partner Violence:   . Fear of Current or Ex-Partner: Not on file  . Emotionally Abused: Not on file  . Physically Abused: Not on file  . Sexually Abused: Not on file    Outpatient Medications Prior to Visit  Medication Sig Dispense Refill  . aspirin 81 MG tablet Take 81 mg by  mouth daily.    Marland Kitchen atorvastatin (LIPITOR) 20 MG tablet Take 1 tablet (20 mg total) by mouth daily. 90 tablet 3  . cephALEXin (KEFLEX) 500 MG capsule Take 1 capsule (500 mg total) by mouth 2 (two) times daily. 14 capsule 0  . cetirizine (ZYRTEC) 10 MG tablet Take 1 tablet (10 mg total) by mouth daily as needed for allergies. 30 tablet 5  . Cholecalciferol (VITAMIN D3) 1000 UNITS CAPS Take 2,000 Units by mouth daily.    . clotrimazole (CLOTRIMAZOLE ANTI-FUNGAL) 1 % cream Apply 1 application topically 2 (two) times daily. 113 g 03  . Ferrous Fumarate (HEMOCYTE) 324 (106 Fe) MG TABS tablet Take 1 tablet (106 mg of iron total) by mouth daily. 30 tablet 3  . fluticasone (FLONASE) 50 MCG/ACT nasal spray Place 2 sprays into both nostrils daily. 48 g 2  . fluticasone (FLONASE) 50 MCG/ACT nasal spray Place 2 sprays into both nostrils daily. 16 g 5  . Insulin Pen Needle (PEN NEEDLES) 29G X MISC Use as directed 100 each 3  . metoprolol succinate (TOPROL-XL) 100 MG 24 hr tablet Take 1 tablet (100 mg total) by mouth daily. Take with or immediately following a meal. 90 tablet 1  . insulin aspart (NOVOLOG) 100 UNIT/ML injection Inject 7-20 units 3 times a day with meals 40 mL 1  . LEVEMIR 100 UNIT/ML injection INJECT 10 UNITS SUBCUTANEOUSLY AT BEDTIME 10 mL 0   No facility-administered medications prior to visit.    Allergies  Allergen Reactions  . Humalog [Insulin Lispro] Other (See Comments)    Sugar drops dangerously low   . Lisinopril     Hyperkalemia   . Viagra [Sildenafil Citrate] Other (See Comments)    headache    Review of Systems  Constitutional: Negative for fever and malaise/fatigue.  HENT: Negative for congestion.   Eyes: Negative for blurred vision.  Respiratory: Negative for shortness of breath.   Cardiovascular: Negative for chest pain, palpitations and leg swelling.  Gastrointestinal: Negative for abdominal pain, blood in stool and nausea.  Genitourinary: Negative for dysuria  and frequency.  Musculoskeletal: Negative for falls.  Skin: Negative for rash.  Neurological: Negative for dizziness, loss of consciousness and headaches.  Endo/Heme/Allergies: Negative for environmental allergies.  Psychiatric/Behavioral: Negative for depression. The patient is not nervous/anxious.        Objective:    Physical Exam Vitals and nursing note  reviewed.  Constitutional:      General: He is not in acute distress.    Appearance: He is well-developed.  HENT:     Head: Normocephalic and atraumatic.     Nose: Nose normal.  Eyes:     General:        Right eye: No discharge.        Left eye: No discharge.  Cardiovascular:     Rate and Rhythm: Normal rate and regular rhythm.     Heart sounds: No murmur.  Pulmonary:     Effort: Pulmonary effort is normal.     Breath sounds: Normal breath sounds.  Abdominal:     General: Bowel sounds are normal.     Palpations: Abdomen is soft.     Tenderness: There is no abdominal tenderness.  Musculoskeletal:     Cervical back: Normal range of motion and neck supple.  Skin:    General: Skin is warm and dry.  Neurological:     Mental Status: He is alert and oriented to person, place, and time.     BP 128/62 (BP Location: Left Arm, Patient Position: Sitting, Cuff Size: Normal)   Pulse (!) 51   Temp 98.3 F (36.8 C) (Temporal)   Resp 18   Wt 201 lb (91.2 kg)   SpO2 96%   BMI 30.56 kg/m  Wt Readings from Last 3 Encounters:  04/20/19 201 lb (91.2 kg)  10/13/18 201 lb 9.6 oz (91.4 kg)  06/09/18 205 lb 6.4 oz (93.2 kg)    Diabetic Foot Exam - Simple   No data filed     Lab Results  Component Value Date   WBC 6.9 01/26/2019   HGB 13.3 01/26/2019   HCT 41.2 01/26/2019   PLT 161.0 01/26/2019   GLUCOSE 281 (H) 01/26/2019   CHOL 149 10/05/2018   TRIG 86.0 10/05/2018   HDL 47.90 10/05/2018   LDLDIRECT 71 09/10/2008   LDLCALC 84 10/05/2018   ALT 24 01/26/2019   AST 23 01/26/2019   NA 137 01/26/2019   K 4.6  01/26/2019   CL 100 01/26/2019   CREATININE 0.97 01/26/2019   BUN 22 01/26/2019   CO2 29 01/26/2019   TSH 3.44 10/05/2018   PSA 0.38 10/14/2017   HGBA1C 7.4 (H) 01/26/2019   MICROALBUR <0.7 02/15/2015    Lab Results  Component Value Date   TSH 3.44 10/05/2018   Lab Results  Component Value Date   WBC 6.9 01/26/2019   HGB 13.3 01/26/2019   HCT 41.2 01/26/2019   MCV 90.4 01/26/2019   PLT 161.0 01/26/2019   Lab Results  Component Value Date   NA 137 01/26/2019   K 4.6 01/26/2019   CO2 29 01/26/2019   GLUCOSE 281 (H) 01/26/2019   BUN 22 01/26/2019   CREATININE 0.97 01/26/2019   BILITOT 0.7 01/26/2019   ALKPHOS 79 01/26/2019   AST 23 01/26/2019   ALT 24 01/26/2019   PROT 7.0 01/26/2019   ALBUMIN 4.5 01/26/2019   CALCIUM 9.5 01/26/2019   GFR 79.33 01/26/2019   Lab Results  Component Value Date   CHOL 149 10/05/2018   Lab Results  Component Value Date   HDL 47.90 10/05/2018   Lab Results  Component Value Date   LDLCALC 84 10/05/2018   Lab Results  Component Value Date   TRIG 86.0 10/05/2018   Lab Results  Component Value Date   CHOLHDL 3 10/05/2018   Lab Results  Component Value Date   HGBA1C 7.4 (  H) 01/26/2019       Assessment & Plan:   Problem List Items Addressed This Visit    Hyperlipidemia    Encouraged heart healthy diet, increase exercise, avoid trans fats, consider a krill oil cap daily. Tolerating Atorvastatin      Essential hypertension    Well controlled, no changes to meds. Encouraged heart healthy diet such as the DASH diet and exercise as tolerated.       DM (diabetes mellitus), type 1 with ophthalmic complications (HCC)    hgba1c acceptable, minimize simple carbs. Increase exercise as tolerated. Continue current meds has noted some low numbers during the day even into the 40s especially when he is working so he is advised to hold his am and noon 3 unit doses of insulin for now and monitor.       Relevant Medications   insulin  detemir (LEVEMIR) 100 UNIT/ML injection   insulin aspart (NOVOLOG) 100 UNIT/ML injection   Dental infection    Left lower tooth pain and gum inflammation. Started on Augmentin and advised to take a probiotic and establish with a dentist.          I have changed Scott Adkins "Ron"'s Levemir to insulin detemir. I am also having him start on amoxicillin-clavulanate. Additionally, I am having him maintain his Vitamin D3, aspirin, fluticasone, Ferrous Fumarate, clotrimazole, fluticasone, cetirizine, Pen Needles, metoprolol succinate, atorvastatin, cephALEXin, and insulin aspart.  Meds ordered this encounter  Medications  . insulin detemir (LEVEMIR) 100 UNIT/ML injection    Sig: INJECT 10 UNITS SUBCUTANEOUSLY AT BEDTIME    Dispense:  10 mL    Refill:  0  . DISCONTD: insulin aspart (NOVOLOG) 100 UNIT/ML injection    Sig: Inject 7-20 units 3 times a day with meals    Dispense:  40 mL    Refill:  1  . amoxicillin-clavulanate (AUGMENTIN) 875-125 MG tablet    Sig: Take 1 tablet by mouth 2 (two) times daily.    Dispense:  20 tablet    Refill:  0  . insulin aspart (NOVOLOG) 100 UNIT/ML injection    Sig: Inject 7-20 units 3 times a day with meals    Dispense:  40 mL    Refill:  1     Danise Edge, MD

## 2019-04-22 NOTE — Assessment & Plan Note (Signed)
Well controlled, no changes to meds. Encouraged heart healthy diet such as the DASH diet and exercise as tolerated.  °

## 2019-04-22 NOTE — Assessment & Plan Note (Signed)
Left lower tooth pain and gum inflammation. Started on Augmentin and advised to take a probiotic and establish with a dentist.

## 2019-04-22 NOTE — Assessment & Plan Note (Signed)
Encouraged heart healthy diet, increase exercise, avoid trans fats, consider a krill oil cap daily. Tolerating Atorvastatin 

## 2019-04-22 NOTE — Assessment & Plan Note (Addendum)
hgba1c acceptable, minimize simple carbs. Increase exercise as tolerated. Continue current meds has noted some low numbers during the day even into the 40s especially when he is working so he is advised to hold his am and noon 3 unit doses of insulin for now and monitor.

## 2019-04-27 ENCOUNTER — Other Ambulatory Visit: Payer: Self-pay

## 2019-04-27 ENCOUNTER — Other Ambulatory Visit: Payer: Managed Care, Other (non HMO)

## 2019-04-27 DIAGNOSIS — I10 Essential (primary) hypertension: Secondary | ICD-10-CM

## 2019-04-27 DIAGNOSIS — E103292 Type 1 diabetes mellitus with mild nonproliferative diabetic retinopathy without macular edema, left eye: Secondary | ICD-10-CM

## 2019-04-27 DIAGNOSIS — E782 Mixed hyperlipidemia: Secondary | ICD-10-CM

## 2019-05-04 ENCOUNTER — Other Ambulatory Visit: Payer: Managed Care, Other (non HMO)

## 2019-05-06 IMAGING — DX DG CHEST 2V
2 series · 2 of 2 positions shown · non-contrast
Comparison: 10/19/2012

CLINICAL DATA: cough/sob

EXAM:
CHEST  2 VIEW

[chest pa]
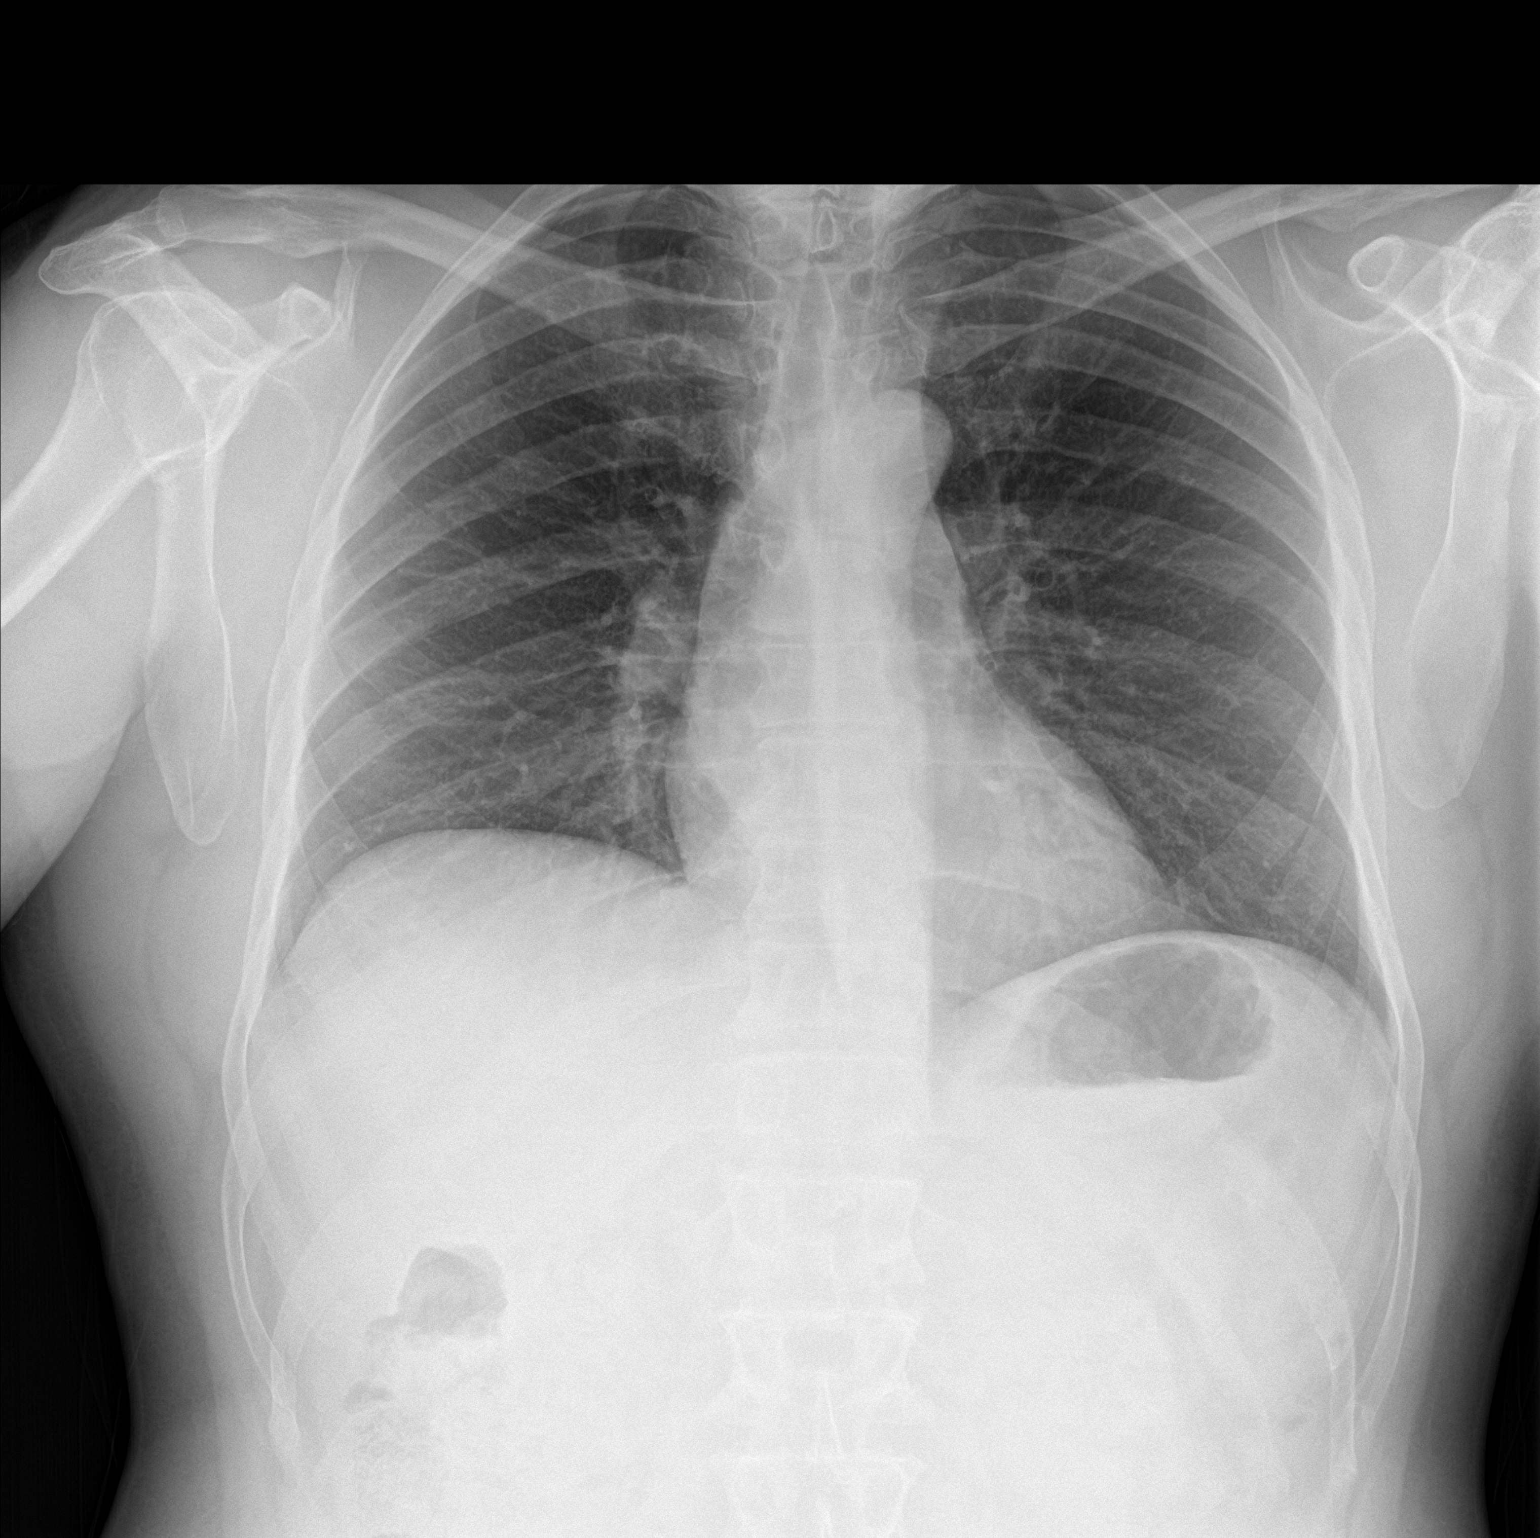

[chest lat]
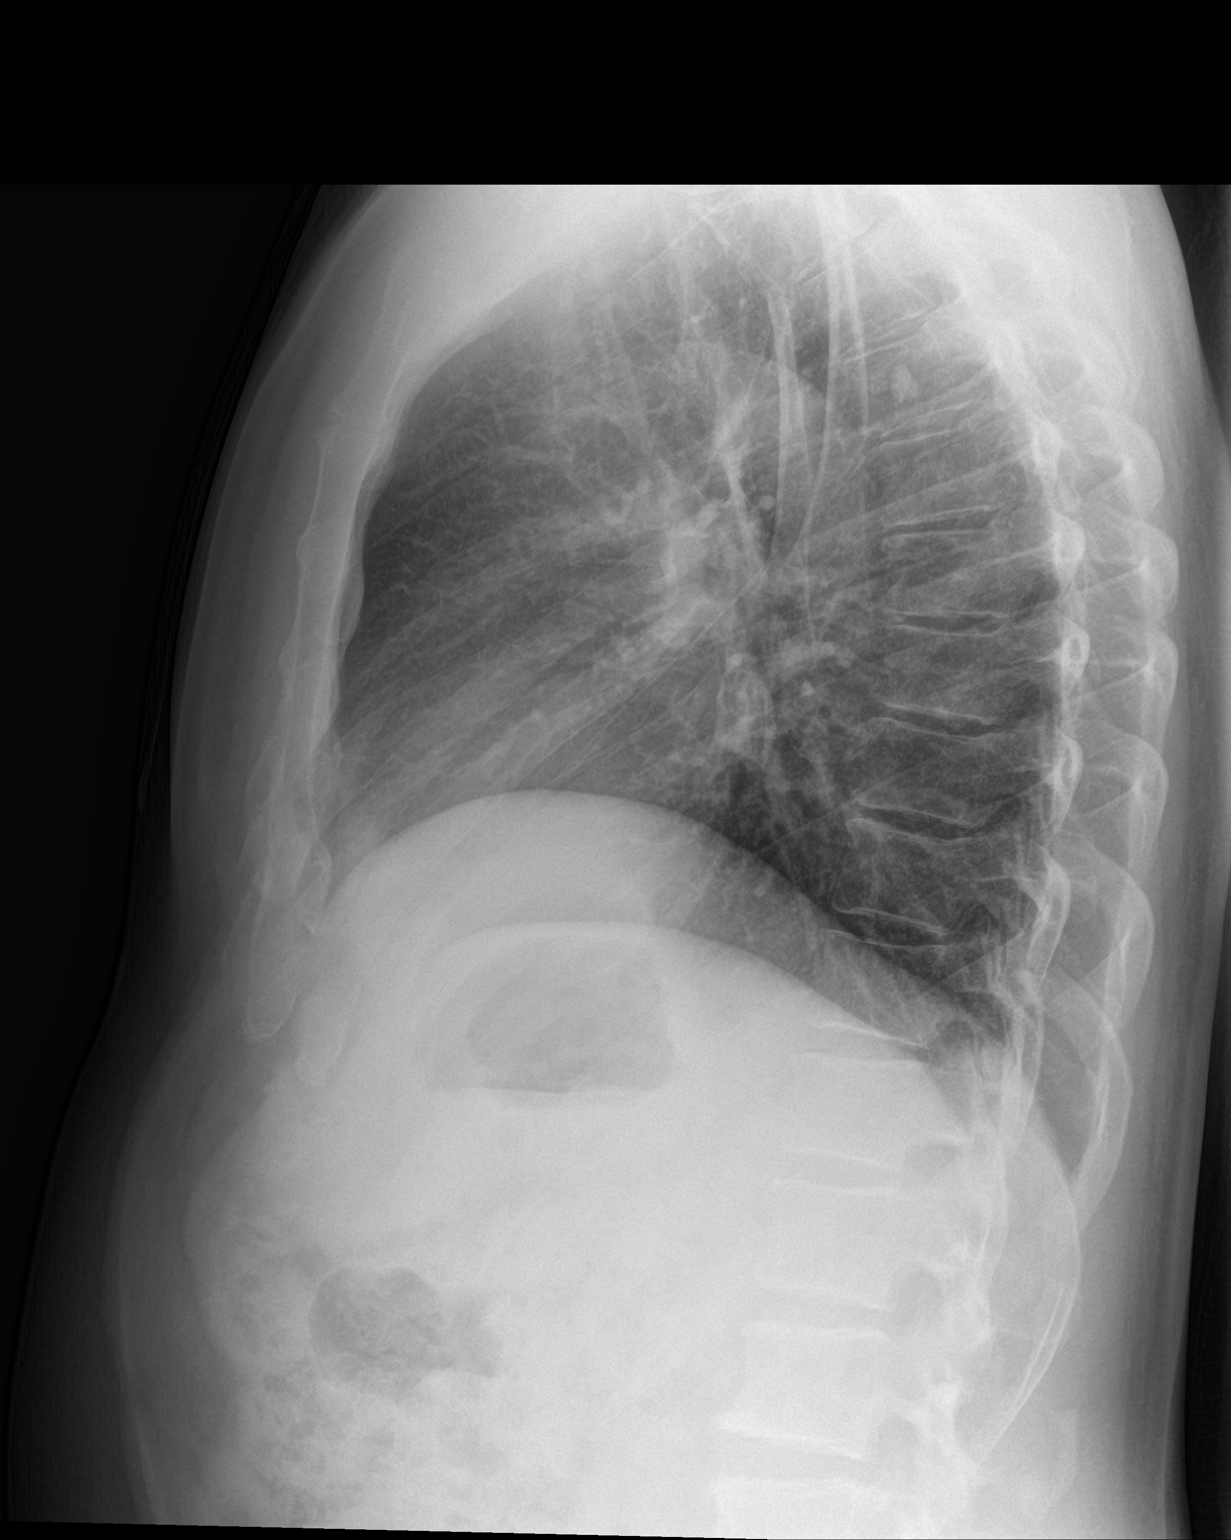

[2 of 2 positions shown; findings below may reference images not displayed]

FINDINGS: Normal heart size. Calcified density is seen on the lateral view
projecting over the upper thoracic spine. Lungs otherwise clear. No
pneumothorax. No pleural effusion.
IMPRESSION: No active cardiopulmonary disease.

## 2019-05-07 IMAGING — CT CT ANGIO CHEST
2 of 8 series · 18 of 36 positions shown · IV contrast (iopamidol)
Comparison: Chest radiograph May 17, 2017

CLINICAL DATA: Shortness of breath with cough

EXAM:
CT ANGIOGRAPHY CHEST WITH CONTRAST
TECHNIQUE: Multidetector CT imaging of the chest was performed using the
standard protocol during bolus administration of intravenous
contrast. Multiplanar CT image reconstructions and MIPs were
obtained to evaluate the vascular anatomy.
CONTRAST:  100mL [id number] IOPAMIDOL ([id number]) INJECTION 76%

[Series 6: pe thins · axial · 0.65mm/px · z∈[-241,-8]mm · 17 of 261 slices shown]
[im 14/261  lung]
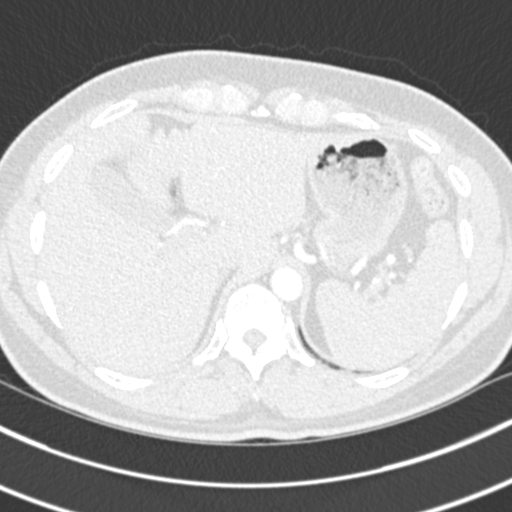
[im 28/261  mediastinal]
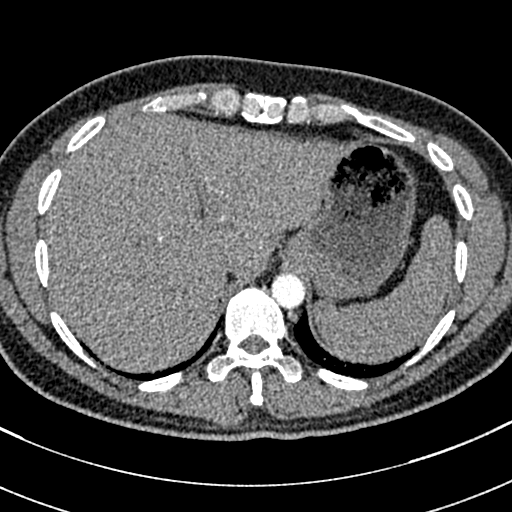
[im 42/261  lung]
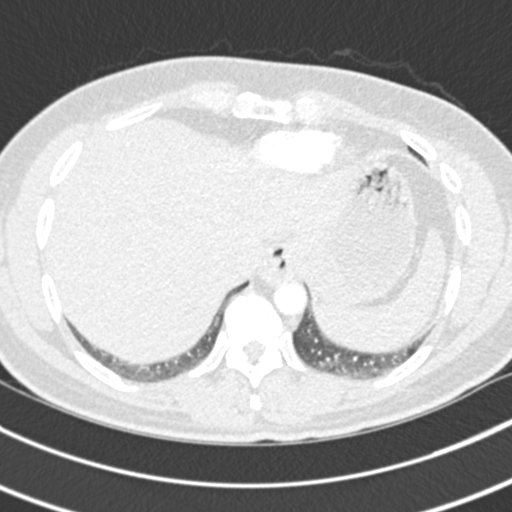
[im 55/261  mediastinal]
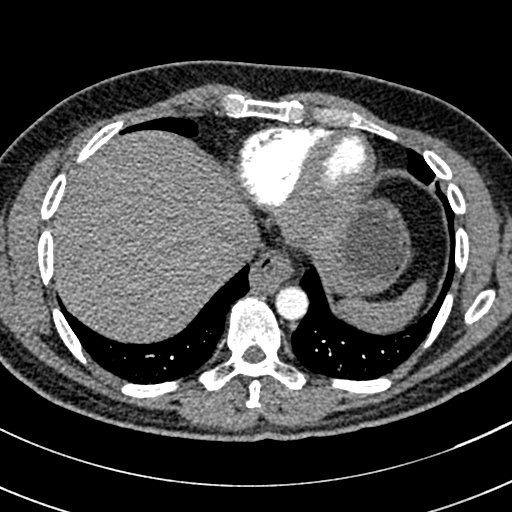
[im 69/261  lung]
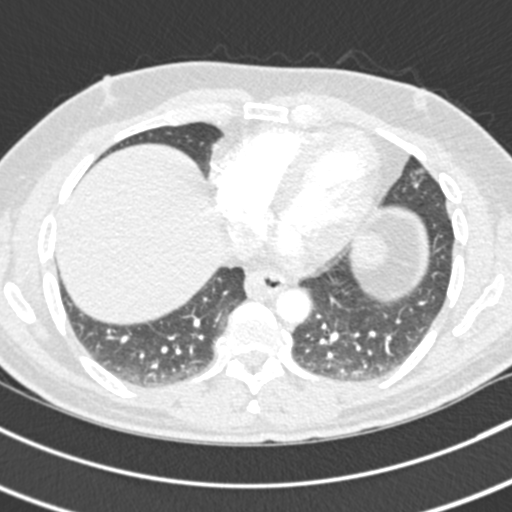
[im 83/261  mediastinal]
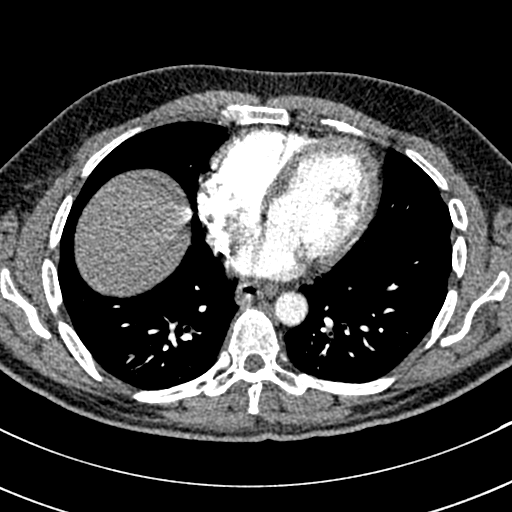
[im 96/261  lung]
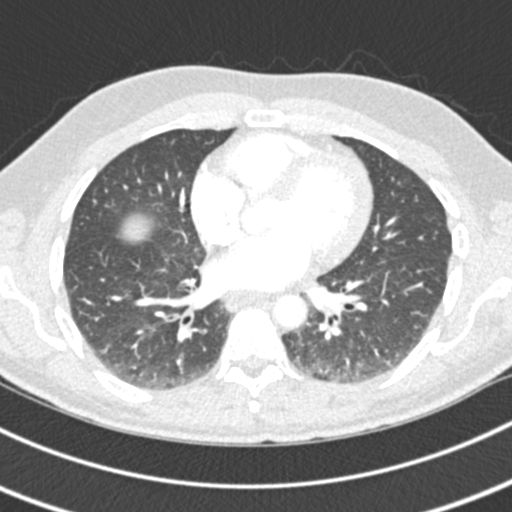
[im 110/261  mediastinal]
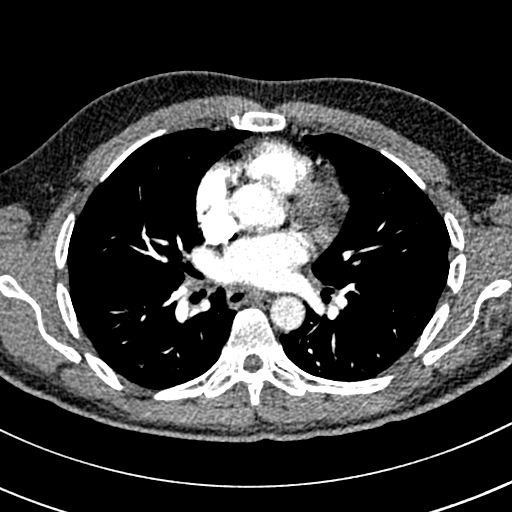
[im 137/261  lung]
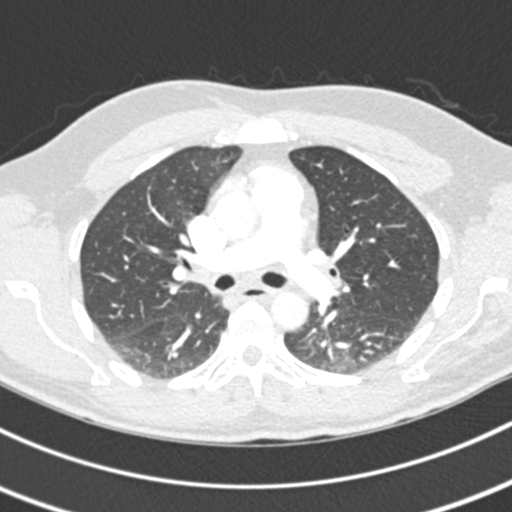
[im 151/261  mediastinal]
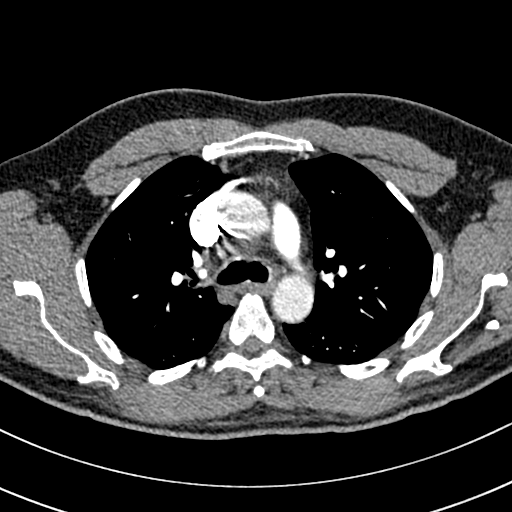
[im 165/261  lung]
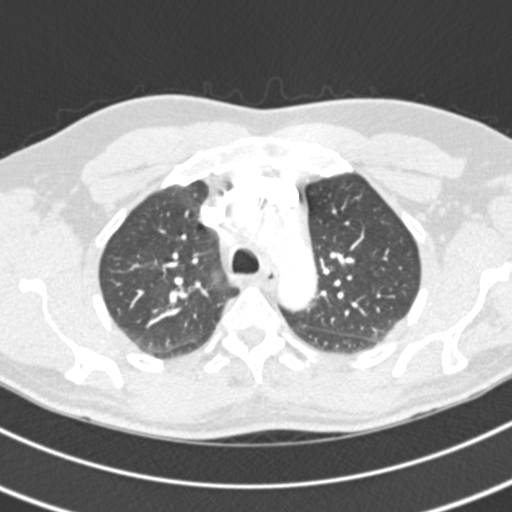
[im 178/261  mediastinal]
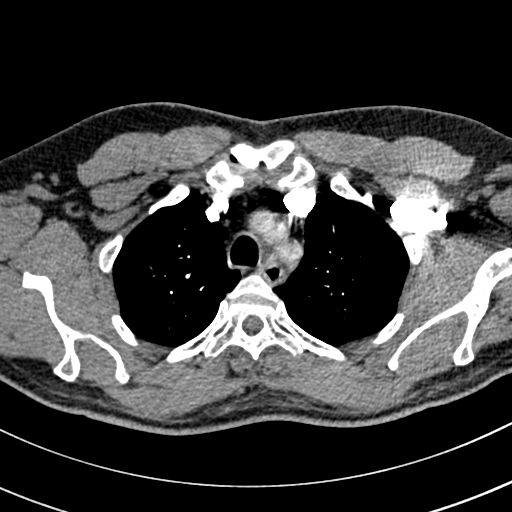
[im 192/261  lung]
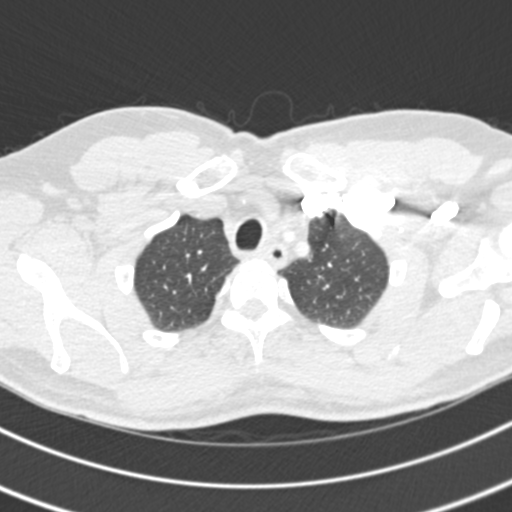
[im 206/261  mediastinal]
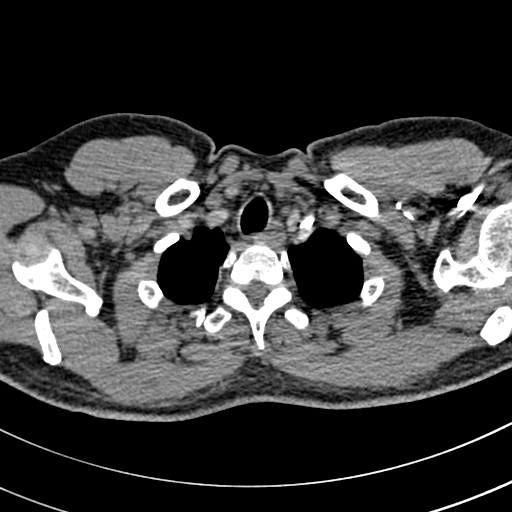
[im 219/261  lung]
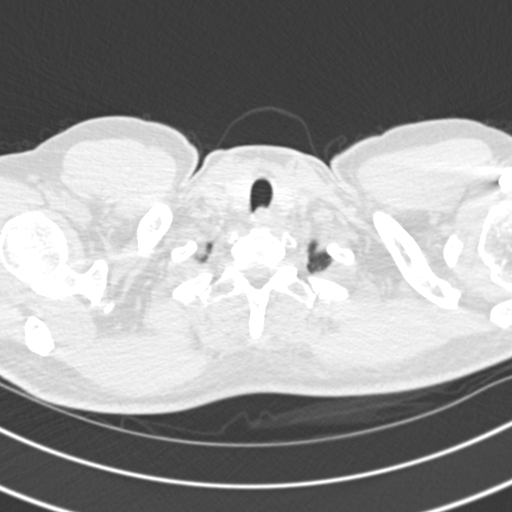
[im 233/261  mediastinal]
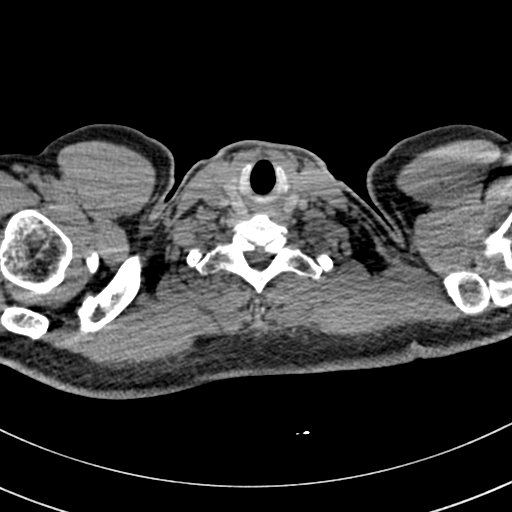
[im 247/261  lung]
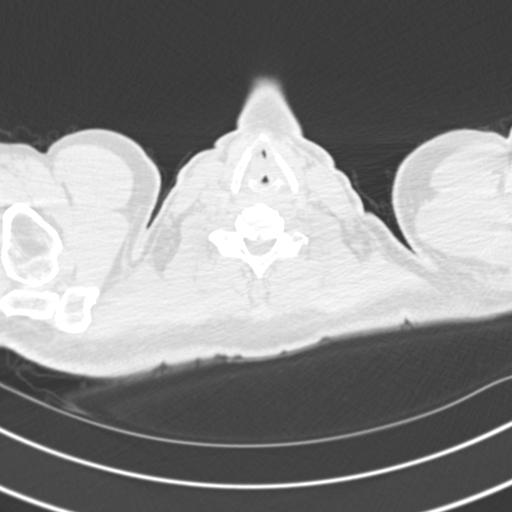

[Series 7: pe coronal mpr · coronal · 0.53mm/px · 1 of 126 slices shown]
[im 63/126  mediastinal]
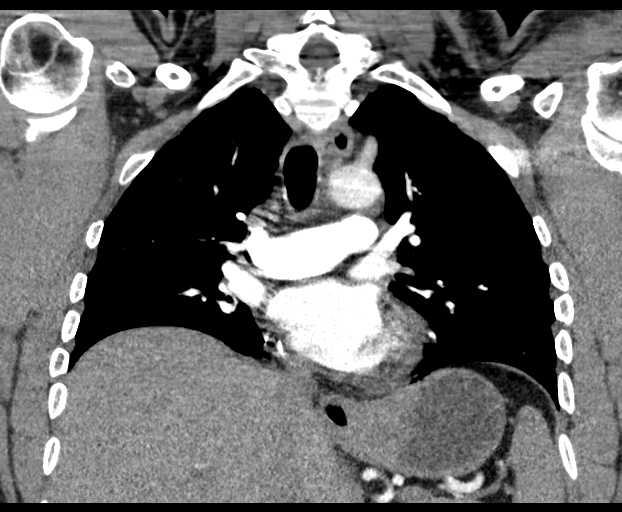

[18 of 36 positions shown; findings below may reference images not displayed]

FINDINGS: Cardiovascular: There is no demonstrable pulmonary embolus. There is
no thoracic aortic aneurysm or dissection. Visualized great vessels
show modest scattered areas of atherosclerotic calcification. No
appreciable hemodynamically significant obstruction is seen in these
vessels. There are occasional foci of coronary artery calcification.
There is mild aortic atherosclerosis. There is no pericardial
effusion or pericardial thickening evident.

Mediastinum/Nodes: Thyroid appears unremarkable. There is no
appreciable thoracic adenopathy. There is a small hiatal hernia.

Lungs/Pleura: There is no edema or consolidation. There is mild
patchy atelectatic change in both lower lobes. No pleural effusion
or pleural thickening evident.

Upper Abdomen: Visualized upper abdominal structures appear
unremarkable.

Musculoskeletal: No blastic or lytic bone lesions. There is a small
area of myositis ossificans lateral to the left scapula.

Review of the MIP images confirms the above findings.
IMPRESSION: 1. No demonstrable pulmonary embolus. No thoracic aortic aneurysm or
dissection.

2.  Lower lobe atelectatic change.  No lung edema or consolidation.

3. Mild aortic atherosclerosis. Scattered foci coronary artery
calcification and great vessel atherosclerotic calcification.

4.  No evident adenopathy.

5.  Small hiatal hernia.

Aortic Atherosclerosis ([id number].[id number]).

## 2019-05-25 ENCOUNTER — Other Ambulatory Visit: Payer: Managed Care, Other (non HMO)

## 2019-06-01 ENCOUNTER — Other Ambulatory Visit: Payer: Self-pay

## 2019-06-01 ENCOUNTER — Other Ambulatory Visit (INDEPENDENT_AMBULATORY_CARE_PROVIDER_SITE_OTHER): Payer: Managed Care, Other (non HMO)

## 2019-06-01 DIAGNOSIS — I10 Essential (primary) hypertension: Secondary | ICD-10-CM

## 2019-06-01 DIAGNOSIS — E782 Mixed hyperlipidemia: Secondary | ICD-10-CM | POA: Diagnosis not present

## 2019-06-01 DIAGNOSIS — E103292 Type 1 diabetes mellitus with mild nonproliferative diabetic retinopathy without macular edema, left eye: Secondary | ICD-10-CM

## 2019-06-02 LAB — LIPID PANEL
Cholesterol: 173 mg/dL (ref 0–200)
HDL: 49.1 mg/dL (ref >39.00)
LDL Cholesterol: 99 mg/dL (ref 0–99)
NonHDL: 123.63
Total CHOL/HDL Ratio: 4
Triglycerides: 124 mg/dL (ref 0.0–149.0)
VLDL: 24.8 mg/dL (ref 0.0–40.0)

## 2019-06-02 LAB — COMPREHENSIVE METABOLIC PANEL
ALT: 30 U/L (ref 0–53)
AST: 25 U/L (ref 0–37)
Albumin: 4.3 g/dL (ref 3.5–5.2)
Alkaline Phosphatase: 86 U/L (ref 39–117)
BUN: 22 mg/dL (ref 6–23)
CO2: 28 mEq/L (ref 19–32)
Calcium: 10 mg/dL (ref 8.4–10.5)
Chloride: 101 mEq/L (ref 96–112)
Creatinine, Ser: 0.98 mg/dL (ref 0.40–1.50)
GFR: 78.31 mL/min (ref >60.00)
Glucose, Bld: 142 mg/dL — ABNORMAL HIGH (ref 70–99)
Potassium: 4.3 mEq/L (ref 3.5–5.1)
Sodium: 139 mEq/L (ref 135–145)
Total Bilirubin: 0.7 mg/dL (ref 0.2–1.2)
Total Protein: 7.3 g/dL (ref 6.0–8.3)

## 2019-06-02 LAB — TSH: TSH: 2.91 u[IU]/mL (ref 0.35–4.50)

## 2019-06-02 LAB — CBC
HCT: 41.8 % (ref 39.0–52.0)
Hemoglobin: 13.9 g/dL (ref 13.0–17.0)
MCHC: 33.2 g/dL (ref 30.0–36.0)
MCV: 89.8 fl (ref 78.0–100.0)
Platelets: 183 10*3/uL (ref 150.0–400.0)
RBC: 4.66 Mil/uL (ref 4.22–5.81)
RDW: 12.1 % (ref 11.5–15.5)
WBC: 7.6 10*3/uL (ref 4.0–10.5)

## 2019-06-02 LAB — HEMOGLOBIN A1C: Hgb A1c MFr Bld: 7.4 % — ABNORMAL HIGH (ref 4.6–6.5)

## 2019-06-03 ENCOUNTER — Other Ambulatory Visit: Payer: Self-pay | Admitting: Family Medicine

## 2019-06-14 ENCOUNTER — Other Ambulatory Visit: Payer: Self-pay | Admitting: Family Medicine

## 2019-07-21 LAB — HM DIABETES EYE EXAM

## 2019-08-21 ENCOUNTER — Other Ambulatory Visit: Payer: Self-pay

## 2019-08-21 ENCOUNTER — Ambulatory Visit: Payer: Managed Care, Other (non HMO) | Admitting: Family Medicine

## 2019-08-21 DIAGNOSIS — E782 Mixed hyperlipidemia: Secondary | ICD-10-CM | POA: Diagnosis not present

## 2019-08-21 DIAGNOSIS — E103292 Type 1 diabetes mellitus with mild nonproliferative diabetic retinopathy without macular edema, left eye: Secondary | ICD-10-CM

## 2019-08-21 DIAGNOSIS — I10 Essential (primary) hypertension: Secondary | ICD-10-CM

## 2019-08-21 NOTE — Assessment & Plan Note (Signed)
hgba1c acceptable, minimize simple carbs. Increase exercise as tolerated. Continue current meds. Highest number in past month was 253 after over eating. 33 when he took too much insulin. Most of the time stays in 60 to 130 range. Average fasting 65 and after eating 124

## 2019-08-21 NOTE — Assessment & Plan Note (Signed)
Well controlled, no changes to meds. Encouraged heart healthy diet such as the DASH diet and exercise as tolerated.  °

## 2019-08-21 NOTE — Progress Notes (Signed)
Subjective:    Patient ID: Scott Adkins, male    DOB: May 02, 1960, 59 y.o.   MRN: [id number]  Chief Complaint  Patient presents with  . 4 month follow up    HPI Patient is in today for follow up chronic medical concerns. No recent febrile illness or hospitalizations. He has had one of two COVID shots and tolerated it well. His sugars have been labile in past month 33 to 253 but both abnormal numbers could be explained. Most numbers between 100 and 170. Denies CP/palp/SOB/HA/congestion/fevers/GI or GU c/o. Taking meds as prescribed  Past Medical History:  Diagnosis Date  . Anemia 12/22/2015  . Arthritis   . Chicken pox as a child  . Constipation 12/22/2016  . Diabetes mellitus type I (HCC)    uses insulin pump// managed by Dr. Casimiro Needle [place]  . 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 Hyperlipidemia   . Hypertension   . Injury of right rotator cuff 08/16/2012   Has been seen by [facility]  . Left flank pain 09/12/2015  . Mumps as a child  . Pain in joint, lower leg 01/24/2016  . Preventative health care 02/19/2013  . Sun-damaged skin 12/06/2015    Past Surgical History:  Procedure Laterality Date  . insulin pump     placed and removed  . REFRACTIVE SURGERY Right 2007   [facility]  . ROTATOR CUFF REPAIR  2004   left    Family History  Problem Relation Age of Onset  . Hypertension Mother   . Stroke Father   . Hypertension Father   . Hyperlipidemia Father   . Hypertension Brother   . Hyperlipidemia Brother   . Mental illness Brother        depression  . Heart disease Maternal Grandmother        MI  . Heart disease Maternal Grandfather        MI  . Hyperlipidemia Brother   . Hypertension Brother   . Colon cancer Neg Hx   . Prostate cancer Neg Hx   . Breast cancer Neg Hx   . Rectal cancer Neg Hx   . Diabetes Neg Hx     Social History   Socioeconomic History  . Marital status: Married    Spouse name: Not on file  .  Number of children: 3  . Years of education: Not on file  . Highest education level: Not on file  Occupational History  . Occupation: welder  Tobacco Use  . Smoking status: Never Smoker  . Smokeless tobacco: Never Used  Substance and Sexual Activity  . Alcohol use: No    Alcohol/week: 1.0 standard drinks    Types: 1 Shots of liquor per week  . Drug use: No  . Sexual activity: Not on file    Comment: lives with wife and mother in law, no dietary restrictions  Other Topics Concern  . Not on file  Social History Narrative   Married 14 yrs   2 daughters; 1 son   Occupation: Psychologist, occupational      Social Determinants of Corporate investment banker Strain:   . Difficulty of Paying Living Expenses:   Food Insecurity:   . Worried About Programme researcher, broadcasting/film/video in the Last Year:   . Barista in the Last Year:   Transportation Needs:   . Freight forwarder (Medical):   Marland Kitchen Lack of Transportation (Non-Medical):   Physical Activity:   .  Days of Exercise per Week:   . Minutes of Exercise per Session:   Stress:   . Feeling of Stress :   Social Connections:   . Frequency of Communication with Friends and Family:   . Frequency of Social Gatherings with Friends and Family:   . Attends Religious Services:   . Active Member of Clubs or Organizations:   . Attends Banker Meetings:   Marland Kitchen Marital Status:   Intimate Partner Violence:   . Fear of Current or Ex-Partner:   . Emotionally Abused:   Marland Kitchen Physically Abused:   . Sexually Abused:     Outpatient Medications Prior to Visit  Medication Sig Dispense Refill  . aspirin 81 MG tablet Take 81 mg by mouth daily.    Marland Kitchen atorvastatin (LIPITOR) 20 MG tablet Take 1 tablet (20 mg total) by mouth daily. 90 tablet 3  . Cholecalciferol (VITAMIN D3) 1000 UNITS CAPS Take 2,000 Units by mouth daily.    . insulin aspart (NOVOLOG) 100 UNIT/ML injection Inject 7-20 units 3 times a day with meals 40 mL 1  . insulin detemir (LEVEMIR) 100 UNIT/ML  injection INJECT 10 UNITS SUBCUTANEOUSLY AT BEDTIME 10 mL 3  . Insulin Pen Needle (PEN NEEDLES) 29G X MISC Use as directed 100 each 3  . metoprolol succinate (TOPROL-XL) 100 MG 24 hr tablet TAKE 1 TABLET BY MOUTH ONCE DAILY WITH OR IMMEDIATELY FOLLOWING A MEAL 90 tablet 0  . amoxicillin-clavulanate (AUGMENTIN) 875-125 MG tablet Take 1 tablet by mouth 2 (two) times daily. 20 tablet 0  . cephALEXin (KEFLEX) 500 MG capsule Take 1 capsule (500 mg total) by mouth 2 (two) times daily. 14 capsule 0  . cetirizine (ZYRTEC) 10 MG tablet Take 1 tablet (10 mg total) by mouth daily as needed for allergies. 30 tablet 5  . clotrimazole (CLOTRIMAZOLE ANTI-FUNGAL) 1 % cream Apply 1 application topically 2 (two) times daily. 113 g 03  . Ferrous Fumarate (HEMOCYTE) 324 (106 Fe) MG TABS tablet Take 1 tablet (106 mg of iron total) by mouth daily. 30 tablet 3  . fluticasone (FLONASE) 50 MCG/ACT nasal spray Place 2 sprays into both nostrils daily. 48 g 2  . fluticasone (FLONASE) 50 MCG/ACT nasal spray Place 2 sprays into both nostrils daily. 16 g 5   No facility-administered medications prior to visit.    Allergies  Allergen Reactions  . Humalog [Insulin Lispro] Other (See Comments)    Sugar drops dangerously low   . Lisinopril     Hyperkalemia   . Viagra [Sildenafil Citrate] Other (See Comments)    headache    Review of Systems  Constitutional: Negative for fever and malaise/fatigue.  HENT: Negative for congestion.   Eyes: Negative for blurred vision.  Respiratory: Negative for shortness of breath.   Cardiovascular: Negative for chest pain, palpitations and leg swelling.  Gastrointestinal: Negative for abdominal pain, blood in stool and nausea.  Genitourinary: Negative for dysuria and frequency.  Musculoskeletal: Negative for falls.  Skin: Negative for rash.  Neurological: Negative for dizziness, loss of consciousness and headaches.  Endo/Heme/Allergies: Negative for environmental allergies.    Psychiatric/Behavioral: Negative for depression. The patient is not nervous/anxious.        Objective:    Physical Exam Vitals and nursing note reviewed.  Constitutional:      General: He is not in acute distress.    Appearance: He is well-developed.  HENT:     Head: Normocephalic and atraumatic.     Nose: Nose normal.  Eyes:     General:        Right eye: No discharge.        Left eye: No discharge.  Cardiovascular:     Rate and Rhythm: Normal rate and regular rhythm.     Heart sounds: No murmur.  Pulmonary:     Effort: Pulmonary effort is normal.     Breath sounds: Normal breath sounds.  Abdominal:     General: Bowel sounds are normal.     Palpations: Abdomen is soft.     Tenderness: There is no abdominal tenderness.  Musculoskeletal:     Cervical back: Normal range of motion and neck supple.  Skin:    General: Skin is warm and dry.  Neurological:     Mental Status: He is alert and oriented to person, place, and time.     BP 128/60 (BP Location: Right Arm, Cuff Size: Large)   Pulse (!) 53   Temp 98.3 F (36.8 C) (Temporal)   Resp 12   Ht 5\' 8"  (1.727 m)   Wt 201 lb (91.2 kg)   SpO2 98%   BMI 30.56 kg/m  Wt Readings from Last 3 Encounters:  08/21/19 201 lb (91.2 kg)  04/20/19 201 lb (91.2 kg)  10/13/18 201 lb 9.6 oz (91.4 kg)    Diabetic Foot Exam - Simple   No data filed     Lab Results  Component Value Date   WBC 7.6 06/01/2019   HGB 13.9 06/01/2019   HCT 41.8 06/01/2019   PLT 183.0 06/01/2019   GLUCOSE 142 (H) 06/01/2019   CHOL 173 06/01/2019   TRIG 124.0 06/01/2019   HDL 49.10 06/01/2019   LDLDIRECT 71 09/10/2008   LDLCALC 99 06/01/2019   ALT 30 06/01/2019   AST 25 06/01/2019   NA 139 06/01/2019   K 4.3 06/01/2019   CL 101 06/01/2019   CREATININE 0.98 06/01/2019   BUN 22 06/01/2019   CO2 28 06/01/2019   TSH 2.91 06/01/2019   PSA 0.38 10/14/2017   HGBA1C 7.4 (H) 06/01/2019   MICROALBUR <0.7 02/15/2015    Lab Results  Component  Value Date   TSH 2.91 06/01/2019   Lab Results  Component Value Date   WBC 7.6 06/01/2019   HGB 13.9 06/01/2019   HCT 41.8 06/01/2019   MCV 89.8 06/01/2019   PLT 183.0 06/01/2019   Lab Results  Component Value Date   NA 139 06/01/2019   K 4.3 06/01/2019   CO2 28 06/01/2019   GLUCOSE 142 (H) 06/01/2019   BUN 22 06/01/2019   CREATININE 0.98 06/01/2019   BILITOT 0.7 06/01/2019   ALKPHOS 86 06/01/2019   AST 25 06/01/2019   ALT 30 06/01/2019   PROT 7.3 06/01/2019   ALBUMIN 4.3 06/01/2019   CALCIUM 10.0 06/01/2019   GFR 78.31 06/01/2019   Lab Results  Component Value Date   CHOL 173 06/01/2019   Lab Results  Component Value Date   HDL 49.10 06/01/2019   Lab Results  Component Value Date   LDLCALC 99 06/01/2019   Lab Results  Component Value Date   TRIG 124.0 06/01/2019   Lab Results  Component Value Date   CHOLHDL 4 06/01/2019   Lab Results  Component Value Date   HGBA1C 7.4 (H) 06/01/2019       Assessment & Plan:   Problem List Items Addressed This Visit    Hyperlipidemia    Encouraged heart healthy diet, increase exercise, avoid trans fats, consider a krill  oil cap daily      Relevant Orders   Lipid panel   Essential hypertension    Well controlled, no changes to meds. Encouraged heart healthy diet such as the DASH diet and exercise as tolerated.       Relevant Orders   CBC   Comprehensive metabolic panel   TSH   DM (diabetes mellitus), type 1 with ophthalmic complications (HCC)    JZPH1T acceptable, minimize simple carbs. Increase exercise as tolerated. Continue current meds. Highest number in past month was 253 after over eating. 33 when he took too much insulin. Most of the time stays in 60 to 130 range. Average fasting 65 and after eating 124      Relevant Orders   Hemoglobin A1c      I have discontinued Sherwood Gambler "Ron"'s fluticasone, Ferrous Fumarate, clotrimazole, fluticasone, cetirizine, cephALEXin, and amoxicillin-clavulanate. I  am also having him maintain his Vitamin D3, aspirin, Pen Needles, atorvastatin, insulin aspart, metoprolol succinate, and insulin detemir.  No orders of the defined types were placed in this encounter.    Penni Homans, MD

## 2019-08-21 NOTE — Assessment & Plan Note (Signed)
Encouraged heart healthy diet, increase exercise, avoid trans fats, consider a krill oil cap daily 

## 2019-08-29 ENCOUNTER — Encounter: Payer: Self-pay | Admitting: *Deleted

## 2019-08-31 ENCOUNTER — Other Ambulatory Visit: Payer: Self-pay

## 2019-08-31 ENCOUNTER — Other Ambulatory Visit (INDEPENDENT_AMBULATORY_CARE_PROVIDER_SITE_OTHER): Payer: Managed Care, Other (non HMO)

## 2019-08-31 DIAGNOSIS — E103292 Type 1 diabetes mellitus with mild nonproliferative diabetic retinopathy without macular edema, left eye: Secondary | ICD-10-CM

## 2019-08-31 DIAGNOSIS — E782 Mixed hyperlipidemia: Secondary | ICD-10-CM

## 2019-08-31 DIAGNOSIS — I10 Essential (primary) hypertension: Secondary | ICD-10-CM

## 2019-09-01 ENCOUNTER — Other Ambulatory Visit: Payer: Self-pay | Admitting: Family Medicine

## 2019-09-01 LAB — CBC
HCT: 40.1 % (ref 39.0–52.0)
Hemoglobin: 13.2 g/dL (ref 13.0–17.0)
MCHC: 33.1 g/dL (ref 30.0–36.0)
MCV: 91 fl (ref 78.0–100.0)
Platelets: 183 10*3/uL (ref 150.0–400.0)
RBC: 4.41 Mil/uL (ref 4.22–5.81)
RDW: 12.6 % (ref 11.5–15.5)
WBC: 8.9 10*3/uL (ref 4.0–10.5)

## 2019-09-01 LAB — COMPREHENSIVE METABOLIC PANEL
ALT: 30 U/L (ref 0–53)
AST: 30 U/L (ref 0–37)
Albumin: 4.3 g/dL (ref 3.5–5.2)
Alkaline Phosphatase: 81 U/L (ref 39–117)
BUN: 29 mg/dL — ABNORMAL HIGH (ref 6–23)
CO2: 31 mEq/L (ref 19–32)
Calcium: 9.6 mg/dL (ref 8.4–10.5)
Chloride: 99 mEq/L (ref 96–112)
Creatinine, Ser: 1.13 mg/dL (ref 0.40–1.50)
GFR: 66.38 mL/min (ref >60.00)
Glucose, Bld: 221 mg/dL — ABNORMAL HIGH (ref 70–99)
Potassium: 4.6 mEq/L (ref 3.5–5.1)
Sodium: 137 mEq/L (ref 135–145)
Total Bilirubin: 0.6 mg/dL (ref 0.2–1.2)
Total Protein: 6.9 g/dL (ref 6.0–8.3)

## 2019-09-01 LAB — LIPID PANEL
Cholesterol: 171 mg/dL (ref 0–200)
HDL: 48.8 mg/dL (ref >39.00)
LDL Cholesterol: 98 mg/dL (ref 0–99)
NonHDL: 122.52
Total CHOL/HDL Ratio: 4
Triglycerides: 125 mg/dL (ref 0.0–149.0)
VLDL: 25 mg/dL (ref 0.0–40.0)

## 2019-09-01 LAB — HEMOGLOBIN A1C: Hgb A1c MFr Bld: 7 % — ABNORMAL HIGH (ref 4.6–6.5)

## 2019-09-01 LAB — TSH: TSH: 2.32 u[IU]/mL (ref 0.35–4.50)

## 2019-11-20 ENCOUNTER — Other Ambulatory Visit: Payer: Self-pay | Admitting: Family Medicine

## 2019-12-26 ENCOUNTER — Telehealth: Payer: Self-pay

## 2019-12-26 NOTE — Telephone Encounter (Signed)
PA initiated via Covermymeds; KEY:  [id number]. Awaiting determination.

## 2020-01-01 NOTE — Telephone Encounter (Signed)
PA approved.   CaseId:[id number];Status:Approved;Review Type:Prior Auth;Coverage Start Date:12/26/2019;Coverage End Date:12/31/2020;

## 2020-03-01 ENCOUNTER — Other Ambulatory Visit: Payer: Self-pay | Admitting: Family Medicine

## 2020-03-05 ENCOUNTER — Ambulatory Visit: Payer: Managed Care, Other (non HMO) | Attending: Internal Medicine

## 2020-03-05 ENCOUNTER — Other Ambulatory Visit: Payer: Self-pay | Admitting: Family Medicine

## 2020-03-05 ENCOUNTER — Ambulatory Visit (INDEPENDENT_AMBULATORY_CARE_PROVIDER_SITE_OTHER): Payer: Managed Care, Other (non HMO) | Admitting: Family Medicine

## 2020-03-05 ENCOUNTER — Telehealth: Payer: Self-pay | Admitting: Family Medicine

## 2020-03-05 ENCOUNTER — Other Ambulatory Visit: Payer: Self-pay

## 2020-03-05 VITALS — BP 140/80 | HR 61 | Temp 98.2°F | Resp 16 | Ht 67.0 in | Wt 207.2 lb

## 2020-03-05 DIAGNOSIS — R252 Cramp and spasm: Secondary | ICD-10-CM | POA: Diagnosis not present

## 2020-03-05 DIAGNOSIS — F418 Other specified anxiety disorders: Secondary | ICD-10-CM | POA: Diagnosis not present

## 2020-03-05 DIAGNOSIS — D649 Anemia, unspecified: Secondary | ICD-10-CM | POA: Diagnosis not present

## 2020-03-05 DIAGNOSIS — E782 Mixed hyperlipidemia: Secondary | ICD-10-CM | POA: Diagnosis not present

## 2020-03-05 DIAGNOSIS — Z23 Encounter for immunization: Secondary | ICD-10-CM

## 2020-03-05 DIAGNOSIS — E103292 Type 1 diabetes mellitus with mild nonproliferative diabetic retinopathy without macular edema, left eye: Secondary | ICD-10-CM | POA: Diagnosis not present

## 2020-03-05 DIAGNOSIS — R351 Nocturia: Secondary | ICD-10-CM | POA: Diagnosis not present

## 2020-03-05 DIAGNOSIS — Z0000 Encounter for general adult medical examination without abnormal findings: Secondary | ICD-10-CM

## 2020-03-05 DIAGNOSIS — I10 Essential (primary) hypertension: Secondary | ICD-10-CM

## 2020-03-05 DIAGNOSIS — F320 Major depressive disorder, single episode, mild: Secondary | ICD-10-CM

## 2020-03-05 MED ORDER — SERTRALINE HCL 50 MG PO TABS: 50.0000 mg | ORAL_TABLET | Freq: Every day | ORAL | 3 refills | Status: DC

## 2020-03-05 MED FILL — PFIZER-BIONTECH COVID-19 VA: 30 | 1 days supply | Qty: 0 | Fill #0

## 2020-03-05 NOTE — Telephone Encounter (Signed)
Pt dropped off copy of Covid Vaccination card to have on his chart (1 page) Document put at front office tray under providers name.

## 2020-03-05 NOTE — Progress Notes (Signed)
   Covid-19 Vaccination Clinic  Name:  Scott Adkins    MRN: [id number] DOB: November 26, 1960  03/05/2020  Mr. [name] was observed post Covid-19 immunization for 15 minutes without incident. He was provided with Vaccine Information Sheet and instruction to access the V-Safe system.   [name] was instructed to call 911 with any severe reactions post vaccine: Marland Kitchen Difficulty breathing  . Swelling of face and throat  . A fast heartbeat  . A bad rash all over body  . Dizziness and weakness   Immunizations Administered    Name Date Dose VIS Date Route   Pfizer COVID-19 Vaccine 03/05/2020  2:43 PM 0.3 mL 01/24/2020 Intramuscular   Manufacturer: [employer], [employer]   Lot: [id number]   NDC: [address]-7

## 2020-03-05 NOTE — Patient Instructions (Signed)
Start the Sertraline 1/2 tab daily for a week then increase to a full tab   Preventive Care 35-59 Years Old, Male Preventive care refers to lifestyle choices and visits with your health care provider that can promote health and wellness. This includes:  A yearly physical exam. This is also called an annual well check.  Regular dental and eye exams.  Immunizations.  Screening for certain conditions.  Healthy lifestyle choices, such as eating a healthy diet, getting regular exercise, not using drugs or products that contain nicotine and tobacco, and limiting alcohol use. What can I expect for my preventive care visit? Physical exam Your health care provider will check:  Height and weight. These may be used to calculate body mass index (BMI), which is a measurement that tells if you are at a healthy weight.  Heart rate and blood pressure.  Your skin for abnormal spots. Counseling Your health care provider may ask you questions about:  Alcohol, tobacco, and drug use.  Emotional well-being.  Home and relationship well-being.  Sexual activity.  Eating habits.  Work and work Statistician. What immunizations do I need?  Influenza (flu) vaccine  This is recommended every year. Tetanus, diphtheria, and pertussis (Tdap) vaccine  You may need a Td booster every 10 years. Varicella (chickenpox) vaccine  You may need this vaccine if you have not already been vaccinated. Zoster (shingles) vaccine  You may need this after age 41. Measles, mumps, and rubella (MMR) vaccine  You may need at least one dose of MMR if you were born in 1957 or later. You may also need a second dose. Pneumococcal conjugate (PCV13) vaccine  You may need this if you have certain conditions and were not previously vaccinated. Pneumococcal polysaccharide (PPSV23) vaccine  You may need one or two doses if you smoke cigarettes or if you have certain conditions. Meningococcal conjugate (MenACWY)  vaccine  You may need this if you have certain conditions. Hepatitis A vaccine  You may need this if you have certain conditions or if you travel or work in places where you may be exposed to hepatitis A. Hepatitis B vaccine  You may need this if you have certain conditions or if you travel or work in places where you may be exposed to hepatitis B. Haemophilus influenzae type b (Hib) vaccine  You may need this if you have certain risk factors. Human papillomavirus (HPV) vaccine  If recommended by your health care provider, you may need three doses over 6 months. You may receive vaccines as individual doses or as more than one vaccine together in one shot (combination vaccines). Talk with your health care provider about the risks and benefits of combination vaccines. What tests do I need? Blood tests  Lipid and cholesterol levels. These may be checked every 5 years, or more frequently if you are over 24 years old.  Hepatitis C test.  Hepatitis B test. Screening  Lung cancer screening. You may have this screening every year starting at age 45 if you have a 30-pack-year history of smoking and currently smoke or have quit within the past 15 years.  Prostate cancer screening. Recommendations will vary depending on your family history and other risks.  Colorectal cancer screening. All adults should have this screening starting at age 106 and continuing until age 71. Your health care provider may recommend screening at age 32 if you are at increased risk. You will have tests every 1-10 years, depending on your results and the type of screening test.  Diabetes screening. This is done by checking your blood sugar (glucose) after you have not eaten for a while (fasting). You may have this done every 1-3 years.  Sexually transmitted disease (STD) testing. Follow these instructions at home: Eating and drinking  Eat a diet that includes fresh fruits and vegetables, whole grains, lean protein,  and low-fat dairy products.  Take vitamin and mineral supplements as recommended by your health care provider.  Do not drink alcohol if your health care provider tells you not to drink.  If you drink alcohol: ? Limit how much you have to 0-2 drinks a day. ? Be aware of how much alcohol is in your drink. In the [place] one drink equals one 12 oz bottle of beer (355 mL), one 5 oz glass of wine (148 mL), or one 1 oz glass of hard liquor (44 mL). Lifestyle  Take daily care of your teeth and gums.  Stay active. Exercise for at least 30 minutes on 5 or more days each week.  Do not use any products that contain nicotine or tobacco, such as cigarettes, e-cigarettes, and chewing tobacco. If you need help quitting, ask your health care provider.  If you are sexually active, practice safe sex. Use a condom or other form of protection to prevent STIs (sexually transmitted infections).  Talk with your health care provider about taking a low-dose aspirin every day starting at age 71. What's next?  Go to your health care provider once a year for a well check visit.  Ask your health care provider how often you should have your eyes and teeth checked.  Stay up to date on all vaccines. This information is not intended to replace advice given to you by your health care provider. Make sure you discuss any questions you have with your health care provider. Document Revised: 03/17/2018 Document Reviewed: 03/17/2018 Elsevier Patient Education  2020 [employer].

## 2020-03-05 NOTE — Telephone Encounter (Signed)
Immunizations updated.

## 2020-03-06 ENCOUNTER — Telehealth: Payer: Self-pay | Admitting: *Deleted

## 2020-03-06 LAB — PSA: PSA: 0.4 ng/mL (ref 0.10–4.00)

## 2020-03-06 LAB — COMPREHENSIVE METABOLIC PANEL
ALT: 34 U/L (ref 0–53)
AST: 26 U/L (ref 0–37)
Albumin: 4.1 g/dL (ref 3.5–5.2)
Alkaline Phosphatase: 76 U/L (ref 39–117)
BUN: 23 mg/dL (ref 6–23)
CO2: 32 mEq/L (ref 19–32)
Calcium: 9.5 mg/dL (ref 8.4–10.5)
Chloride: 105 mEq/L (ref 96–112)
Creatinine, Ser: 1.03 mg/dL (ref 0.40–1.50)
GFR: 79.41 mL/min (ref >60.00)
Glucose, Bld: 41 mg/dL — CL (ref 70–99)
Potassium: 4.5 mEq/L (ref 3.5–5.1)
Sodium: 143 mEq/L (ref 135–145)
Total Bilirubin: 0.5 mg/dL (ref 0.2–1.2)
Total Protein: 6.8 g/dL (ref 6.0–8.3)

## 2020-03-06 LAB — CBC WITH DIFFERENTIAL/PLATELET
Basophils Absolute: 0.1 10*3/uL (ref 0.0–0.1)
Basophils Relative: 1.3 % (ref 0.0–3.0)
Eosinophils Absolute: 0.2 10*3/uL (ref 0.0–0.7)
Eosinophils Relative: 2 % (ref 0.0–5.0)
HCT: 42 % (ref 39.0–52.0)
Hemoglobin: 13.6 g/dL (ref 13.0–17.0)
Lymphocytes Relative: 20.1 % (ref 12.0–46.0)
Lymphs Abs: 1.7 10*3/uL (ref 0.7–4.0)
MCHC: 32.5 g/dL (ref 30.0–36.0)
MCV: 89.3 fl (ref 78.0–100.0)
Monocytes Absolute: 0.8 10*3/uL (ref 0.1–1.0)
Monocytes Relative: 9.2 % (ref 3.0–12.0)
Neutro Abs: 5.9 10*3/uL (ref 1.4–7.7)
Neutrophils Relative %: 67.4 % (ref 43.0–77.0)
Platelets: 197 10*3/uL (ref 150.0–400.0)
RBC: 4.7 Mil/uL (ref 4.22–5.81)
RDW: 12.4 % (ref 11.5–15.5)
WBC: 8.7 10*3/uL (ref 4.0–10.5)

## 2020-03-06 LAB — LIPID PANEL
Cholesterol: 196 mg/dL (ref 0–200)
HDL: 53.4 mg/dL (ref >39.00)
LDL Cholesterol: 117 mg/dL — ABNORMAL HIGH (ref 0–99)
NonHDL: 142.1
Total CHOL/HDL Ratio: 4
Triglycerides: 124 mg/dL (ref 0.0–149.0)
VLDL: 24.8 mg/dL (ref 0.0–40.0)

## 2020-03-06 LAB — MICROALBUMIN / CREATININE URINE RATIO
Creatinine,U: 162.1 mg/dL
Microalb Creat Ratio: 1.1 mg/g (ref 0.0–30.0)
Microalb, Ur: 1.8 mg/dL (ref 0.0–1.9)

## 2020-03-06 LAB — TSH: TSH: 3.12 u[IU]/mL (ref 0.35–4.50)

## 2020-03-06 LAB — HEMOGLOBIN A1C: Hgb A1c MFr Bld: 7.3 % — ABNORMAL HIGH (ref 4.6–6.5)

## 2020-03-06 NOTE — Telephone Encounter (Signed)
Dr Carmelia Roller -- Please advise PCP's absence.  CRITICAL VALUE STICKER  CRITICAL VALUE:  Glucose  41  RECEIVER (on-site recipient of call): Mervin Kung, CMa  DATE & TIME NOTIFIED: 03/06/20 @ 11:22  MESSENGER (representative from lab): Janina Mayo  MD NOTIFIED: Carmelia Roller  TIME OF NOTIFICATION:  11:23  RESPONSE:

## 2020-03-06 NOTE — Telephone Encounter (Signed)
If pt is asymptomatic, I assume he has eaten by now and it is not an issue. Monitor BS's at home, if low, he should let us know and we can adjust insulin. Ty.

## 2020-03-06 NOTE — Telephone Encounter (Signed)
Patient informed of results/instructions. He has eaten several times since being here. He verbalized understanding.

## 2020-03-07 ENCOUNTER — Other Ambulatory Visit: Payer: Self-pay | Admitting: Family Medicine

## 2020-03-07 NOTE — Progress Notes (Signed)
Subjective:    Patient ID: Scott Adkins, male    DOB: 05/29/1960, 59 y.o.   MRN: [id number]  Chief Complaint  Patient presents with   Annual Exam    HPI Patient is in today for annual preventative exam and follow up on chronic medical concerns. No recent febrile illness or hospitalizations. No c/o polyuria or polydipsia. His sugars have been well controlled, no notably high or low numbers. He has had his covid shots and tolerated them well. Work is stressful but manageable. He walks over 10000 steps a day at work. Denies CP/palp/SOB/HA/congestion/fevers/GI or GU c/o. Taking meds as prescribed  Past Medical History:  Diagnosis Date   Anemia 12/22/2015   Arthritis    Chicken pox as a child   Constipation 12/22/2016   Diabetes mellitus type I (HCC)    uses insulin pump// managed by Dr. 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   Hyperlipidemia    Hypertension    Injury of right rotator cuff 08/16/2012   Has been seen by [facility]   Left flank pain 09/12/2015   Mumps as a child   Pain in joint, lower leg 01/24/2016   Preventative health care 02/19/2013   Sun-damaged skin 12/06/2015    Past Surgical History:  Procedure Laterality Date   insulin pump     placed and removed   REFRACTIVE SURGERY Right 2007   [facility]   ROTATOR CUFF REPAIR  2004   left    Family History  Problem Relation Age of Onset   Hypertension Mother    Stroke Father    Hypertension Father    Hyperlipidemia Father    Hypertension Brother    Hyperlipidemia Brother    Mental illness Brother        depression   Heart disease Maternal Grandmother        MI   Heart disease Maternal Grandfather        MI   Hyperlipidemia Brother    Hypertension Brother    Colon cancer Neg Hx    Prostate cancer Neg Hx    Breast cancer Neg Hx    Rectal cancer Neg Hx    Diabetes Neg Hx     Social History   Socioeconomic History    Marital status: Married    Spouse name: Not on file   Number of children: 3   Years of education: Not on file   Highest education level: Not on file  Occupational History   Occupation: welder  Tobacco Use   Smoking status: Never Smoker   Smokeless tobacco: Never Used  Substance and Sexual Activity   Alcohol use: No    Alcohol/week: 1.0 standard drink    Types: 1 Shots of liquor per week   Drug use: No   Sexual activity: Not on file    Comment: lives with wife and mother in [place], no dietary restrictions  Other Topics Concern   Not on file  Social History Narrative   Married 14 yrs   2 daughters; 1 son   Occupation: Psychologist, occupational      Social Determinants of Corporate investment banker Strain:    Difficulty of Paying Living Expenses: Not on file  Food Insecurity:    Worried About Programme researcher, broadcasting/film/video in the Last Year: Not on file   [employer] of Food in the Last Year: Not on file  Transportation Needs:    Lack of Transportation (  Medical): Not on file   Lack of Transportation (Non-Medical): Not on file  Physical Activity:    Days of Exercise per Week: Not on file   Minutes of Exercise per Session: Not on file  Stress:    Feeling of Stress : Not on file  Social Connections:    Frequency of Communication with Friends and Family: Not on file   Frequency of Social Gatherings with Friends and Family: Not on file   Attends Religious Services: Not on file   Active Member of Clubs or Organizations: Not on file   Attends Banker Meetings: Not on file   Marital Status: Not on file  Intimate Partner Violence:    Fear of Current or Ex-Partner: Not on file   Emotionally Abused: Not on file   Physically Abused: Not on file   Sexually Abused: Not on file    Outpatient Medications Prior to Visit  Medication Sig Dispense Refill   aspirin 81 MG tablet Take 81 mg by mouth daily.     atorvastatin (LIPITOR) 20 MG tablet Take 1 tablet by mouth once  daily 90 tablet 0   Cholecalciferol (VITAMIN D3) 1000 UNITS CAPS Take 2,000 Units by mouth daily.     insulin aspart (NOVOLOG) 100 UNIT/ML injection Inject 7-20 units 3 times a day with meals 40 mL 1   insulin detemir (LEVEMIR) 100 UNIT/ML injection INJECT 10 UNITS SUBCUTANEOUSLY AT BEDTIME 10 mL 0   Insulin Pen Needle (PEN NEEDLES) 29G X MISC Use as directed 100 each 3   metoprolol succinate (TOPROL-XL) 100 MG 24 hr tablet TAKE 1 TABLET BY MOUTH ONCE DAILY WITH  OR  IMMEDIATELY  FOLLOWING  A  MEAL 90 tablet 0   No facility-administered medications prior to visit.    Allergies  Allergen Reactions   Humalog [Insulin Lispro] Other (See Comments)    Sugar drops dangerously low    Lisinopril     Hyperkalemia    Viagra [Sildenafil Citrate] Other (See Comments)    headache    Review of Systems  Constitutional: Negative for fever and malaise/fatigue.  HENT: Negative for congestion and hearing loss.   Eyes: Negative for blurred vision.  Respiratory: Negative for cough and shortness of breath.   Cardiovascular: Negative for chest pain, palpitations and leg swelling.  Gastrointestinal: Negative for abdominal pain, blood in stool, constipation and nausea.  Genitourinary: Negative for dysuria and frequency.  Musculoskeletal: Negative for falls and neck pain.  Skin: Negative for rash.  Neurological: Negative for dizziness, loss of consciousness and headaches.  Endo/Heme/Allergies: Negative for environmental allergies.  Psychiatric/Behavioral: Negative for depression. The patient is not nervous/anxious.        Objective:    Physical Exam Vitals and nursing note reviewed.  Constitutional:      General: He is not in acute distress.    Appearance: He is well-developed. He is not ill-appearing.  HENT:     Head: Normocephalic and atraumatic.     Right Ear: Tympanic membrane normal. There is no impacted cerumen.     Left Ear: Tympanic membrane normal. There is no impacted  cerumen.     Nose: Nose normal.  Eyes:     General:        Right eye: No discharge.        Left eye: No discharge.     Extraocular Movements: Extraocular movements intact.     Pupils: Pupils are equal, round, and reactive to light.  Cardiovascular:  Rate and Rhythm: Normal rate and regular rhythm.     Heart sounds: No murmur heard.   Pulmonary:     Effort: Pulmonary effort is normal.     Breath sounds: Normal breath sounds.  Abdominal:     General: Bowel sounds are normal.     Palpations: Abdomen is soft. There is no mass.     Tenderness: There is no abdominal tenderness. There is no guarding or rebound.  Musculoskeletal:     Cervical back: Normal range of motion and neck supple.  Skin:    General: Skin is warm and dry.     Findings: No rash.  Neurological:     Mental Status: He is alert and oriented to person, place, and time.     Gait: Gait normal.  Psychiatric:        Behavior: Behavior normal.     BP 140/80    Pulse 61    Temp 98.2 F (36.8 C) (Oral)    Resp 16    Ht 5\' 7"  (1.702 m)    Wt 207 lb 3.2 oz (94 kg)    SpO2 99%    BMI 32.45 kg/m  Wt Readings from Last 3 Encounters:  03/05/20 207 lb 3.2 oz (94 kg)  08/21/19 201 lb (91.2 kg)  04/20/19 201 lb (91.2 kg)    Diabetic Foot Exam - Simple   Simple Foot Form Visual Inspection No deformities, no ulcerations, no other skin breakdown bilaterally: Yes Sensation Testing Intact to touch and monofilament testing bilaterally: Yes Pulse Check Posterior Tibialis and Dorsalis pulse intact bilaterally: Yes Comments    Lab Results  Component Value Date   WBC 8.7 03/05/2020   HGB 13.6 03/05/2020   HCT 42.0 03/05/2020   PLT 197.0 03/05/2020   GLUCOSE 41 (LL) 03/05/2020   CHOL 196 03/05/2020   TRIG 124.0 03/05/2020   HDL 53.40 03/05/2020   LDLDIRECT 71 09/10/2008   LDLCALC 117 (H) 03/05/2020   ALT 34 03/05/2020   AST 26 03/05/2020   NA 143 03/05/2020   K 4.5 03/05/2020   CL 105 03/05/2020   CREATININE 1.03  03/05/2020   BUN 23 03/05/2020   CO2 32 03/05/2020   TSH 3.12 03/05/2020   PSA 0.40 03/05/2020   HGBA1C 7.3 (H) 03/05/2020   MICROALBUR 1.8 03/05/2020    Lab Results  Component Value Date   TSH 3.12 03/05/2020   Lab Results  Component Value Date   WBC 8.7 03/05/2020   HGB 13.6 03/05/2020   HCT 42.0 03/05/2020   MCV 89.3 03/05/2020   PLT 197.0 03/05/2020   Lab Results  Component Value Date   NA 143 03/05/2020   K 4.5 03/05/2020   CO2 32 03/05/2020   GLUCOSE 41 (LL) 03/05/2020   BUN 23 03/05/2020   CREATININE 1.03 03/05/2020   BILITOT 0.5 03/05/2020   ALKPHOS 76 03/05/2020   AST 26 03/05/2020   ALT 34 03/05/2020   PROT 6.8 03/05/2020   ALBUMIN 4.1 03/05/2020   CALCIUM 9.5 03/05/2020   GFR 79.41 03/05/2020   Lab Results  Component Value Date   CHOL 196 03/05/2020   Lab Results  Component Value Date   HDL 53.40 03/05/2020   Lab Results  Component Value Date   LDLCALC 117 (H) 03/05/2020   Lab Results  Component Value Date   TRIG 124.0 03/05/2020   Lab Results  Component Value Date   CHOLHDL 4 03/05/2020   Lab Results  Component Value Date   HGBA1C  7.3 (H) 03/05/2020       Assessment & Plan:   Problem List Items Addressed This Visit    Hyperlipidemia    Tolerating statin, encouraged heart healthy diet, avoid trans fats, minimize simple carbs and saturated fats. Increase exercise as tolerated      Relevant Orders   Lipid panel (Completed)   Depression with anxiety    Has been stressed and struggling with anhedonia, does not endorse suicidal ideation. He is not interested in counseling at this time but agrees to start Sertraline 25 mg po daily x 7 days then increase to 50 mg daily      Relevant Medications   sertraline (ZOLOFT) 50 MG tablet   Essential hypertension    Well controlled, no changes to meds. Encouraged heart healthy diet such as the DASH diet and exercise as tolerated.       Relevant Orders   CBC with Differential/Platelet  (Completed)   Comprehensive metabolic panel (Completed)   TSH (Completed)   Preventative health care - Primary    Patient encouraged to maintain heart healthy diet, regular exercise, adequate sleep. Consider daily probiotics. Take medications as prescribed. Labs ordered and reviewed. He has had his COVID shots and tolerated them well.last colonoscopy 2013, next due at 2023      Relevant Orders   CBC with Differential/Platelet (Completed)   Comprehensive metabolic panel (Completed)   Lipid panel (Completed)   TSH (Completed)   PSA (Completed)   Microalbumin / creatinine urine ratio (Completed)   DM (diabetes mellitus), type 1 with ophthalmic complications (HCC)    hgba1c acceptable, minimize simple carbs. Increase exercise as tolerated. Continue current meds. Labs today show Hypoglycemia but he was asymptomatic, reminded to not skip meals and to always eat protein when he eats carbs      Relevant Orders   Microalbumin / creatinine urine ratio (Completed)   Hemoglobin A1c (Completed)   Muscle cramping    Hydrate and monitor labs. No recent complaints       Relevant Orders   Comprehensive metabolic panel (Completed)   Anemia    Resolved will continue to monitor      Relevant Orders   CBC with Differential/Platelet (Completed)    Other Visit Diagnoses    Nocturia       Relevant Orders   PSA (Completed)   Depression, major, single episode, mild (HCC)       Relevant Medications   sertraline (ZOLOFT) 50 MG tablet      I am having Scott Leylandonald Senteno "Ron" start on sertraline. I am also having him maintain his Vitamin D3, aspirin, Pen Needles, insulin aspart, insulin detemir, atorvastatin, and metoprolol succinate.  Meds ordered this encounter  Medications   sertraline (ZOLOFT) 50 MG tablet    Sig: Take 1 tablet (50 mg total) by mouth daily.    Dispense:  30 tablet    Refill:  3     Danise EdgeStacey Kayana Thoen, MD

## 2020-03-07 NOTE — Assessment & Plan Note (Signed)
Tolerating statin, encouraged heart healthy diet, avoid trans fats, minimize simple carbs and saturated fats. Increase exercise as tolerated 

## 2020-03-07 NOTE — Assessment & Plan Note (Signed)
Resolved will continue to monitor 

## 2020-03-07 NOTE — Assessment & Plan Note (Addendum)
Has been stressed and struggling with anhedonia, does not endorse suicidal ideation. He is not interested in counseling at this time but agrees to start Sertraline 25 mg po daily x 7 days then increase to 50 mg daily

## 2020-03-07 NOTE — Assessment & Plan Note (Signed)
hgba1c acceptable, minimize simple carbs. Increase exercise as tolerated. Continue current meds. Labs today show Hypoglycemia but he was asymptomatic, reminded to not skip meals and to always eat protein when he eats carbs

## 2020-03-07 NOTE — Assessment & Plan Note (Signed)
Patient encouraged to maintain heart healthy diet, regular exercise, adequate sleep. Consider daily probiotics. Take medications as prescribed. Labs ordered and reviewed. He has had his COVID shots and tolerated them well.last colonoscopy 2013, next due at 2023

## 2020-03-07 NOTE — Assessment & Plan Note (Signed)
Well controlled, no changes to meds. Encouraged heart healthy diet such as the DASH diet and exercise as tolerated.  °

## 2020-03-07 NOTE — Assessment & Plan Note (Signed)
Hydrate and monitor labs. No recent complaints

## 2020-03-08 ENCOUNTER — Other Ambulatory Visit: Payer: Self-pay

## 2020-03-08 MED ORDER — INSULIN ASPART 100 UNIT/ML ~~LOC~~ SOLN
SUBCUTANEOUS | 1 refills | Status: DC
Start: 2020-03-08 — End: 2020-06-03

## 2020-06-03 ENCOUNTER — Other Ambulatory Visit: Payer: Self-pay | Admitting: Family Medicine

## 2020-06-03 ENCOUNTER — Encounter: Payer: Self-pay | Admitting: Family Medicine

## 2020-06-03 ENCOUNTER — Ambulatory Visit: Payer: Managed Care, Other (non HMO) | Admitting: Family Medicine

## 2020-06-03 MED ORDER — INSULIN DETEMIR 100 UNIT/ML ~~LOC~~ SOLN
SUBCUTANEOUS | 1 refills | Status: DC
Start: 2020-06-03 — End: 2020-08-23

## 2020-07-17 ENCOUNTER — Other Ambulatory Visit: Payer: Self-pay | Admitting: Family Medicine

## 2020-07-19 ENCOUNTER — Other Ambulatory Visit: Payer: Self-pay | Admitting: Family Medicine

## 2020-08-22 ENCOUNTER — Other Ambulatory Visit: Payer: Self-pay | Admitting: Family Medicine

## 2020-09-19 ENCOUNTER — Other Ambulatory Visit: Payer: Self-pay | Admitting: Family Medicine

## 2020-09-23 ENCOUNTER — Telehealth: Payer: Self-pay | Admitting: Family Medicine

## 2020-09-23 NOTE — Telephone Encounter (Signed)
Pt, states he has poison oak he would like a prescription sent to [facility] on [address] to relive the itching   Please advice

## 2020-09-23 NOTE — Telephone Encounter (Signed)
Patient scheduled tomorrow with Esperanza Richters at 120pm

## 2020-09-24 ENCOUNTER — Other Ambulatory Visit: Payer: Self-pay

## 2020-09-24 ENCOUNTER — Ambulatory Visit: Payer: Managed Care, Other (non HMO) | Admitting: Medical

## 2020-09-24 VITALS — BP 120/70 | HR 50 | Resp 18 | Ht 67.0 in | Wt 202.0 lb

## 2020-09-24 DIAGNOSIS — T7840XA Allergy, unspecified, initial encounter: Secondary | ICD-10-CM | POA: Diagnosis not present

## 2020-09-24 MED ORDER — METHYLPREDNISOLONE ACETATE 40 MG/ML IJ SUSP
40.0000 mg | Freq: Once | INTRAMUSCULAR | Status: AC
  Administered 2020-09-24: 40 mg via INTRAMUSCULAR

## 2020-09-24 MED ORDER — HYDROXYZINE PAMOATE 25 MG PO CAPS: 25.0000 mg | ORAL_CAPSULE | Freq: Three times a day (TID) | ORAL | 0 refills | Status: DC | PRN

## 2020-09-24 MED ORDER — PREDNISONE 10 MG (21) PO TBPK: ORAL_TABLET | ORAL | 0 refills | Status: DC

## 2020-09-24 NOTE — Progress Notes (Addendum)
Subjective:    Patient ID: Scott Adkins, male    DOB: February 11, 1961, 60 y.o.   MRN: [id number]  HPI   Pt has recent rash that itches. Reports rash occurred after direct exposure to poison ivy 10 days ago. Pt rash is in the area of both arms, legs, abdomen and back.  Pt reports no shortness of breath or wheezing. Reports history of known reaction to poison ivy. Pt is diabetic.   Pt is on both levimir and novolog.  Pt is on levimir 12 units at night and sliding scale meal time insulin.  Pt has used steroids before and he will continue to use insulin do sliding scales and eat low sugar.   Sugar today in parking lot when pt checked was 130.  Review of Systems  Constitutional:  Negative for chills, fatigue and fever.  Respiratory:  Negative for cough, shortness of breath and wheezing.   Cardiovascular:  Negative for chest pain and palpitations.  Skin:  Positive for rash.  Neurological:  Negative for dizziness, weakness, numbness and headaches.  Hematological:  Negative for adenopathy. Does not bruise/bleed easily.  Psychiatric/Behavioral:  Negative for behavioral problems and confusion.     Past Medical History:  Diagnosis Date   Anemia 12/22/2015   Arthritis    Chicken pox as a child   Constipation 12/22/2016   Diabetes mellitus type I (HCC)    uses insulin pump// managed by Dr. 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   Hyperlipidemia    Hypertension    Injury of right rotator cuff 08/16/2012   Has been seen by [facility]   Left flank pain 09/12/2015   Mumps as a child   Pain in joint, lower leg 01/24/2016   Preventative health care 02/19/2013   Sun-damaged skin 12/06/2015     Social History   Socioeconomic History   Marital status: Married    Spouse name: Not on file   Number of children: 3   Years of education: Not on file   Highest education level: Not on file  Occupational History   Occupation: welder  Tobacco Use    Smoking status: Never   Smokeless tobacco: Never  Substance and Sexual Activity   Alcohol use: No    Alcohol/week: 1.0 standard drink    Types: 1 Shots of liquor per week   Drug use: No   Sexual activity: Not on file    Comment: lives with wife and mother in [place], no dietary restrictions  Other Topics Concern   Not on file  Social History Narrative   Married 14 yrs   2 daughters; 1 son   Occupation: Psychologist, occupational      Social Determinants of Corporate investment banker Strain: Not on file  Food Insecurity: Not on file  Transportation Needs: Not on file  Physical Activity: Not on file  Stress: Not on file  Social Connections: Not on file  Intimate Partner Violence: Not on file    Past Surgical History:  Procedure Laterality Date   insulin pump     placed and removed   REFRACTIVE SURGERY Right 2007   [facility]   ROTATOR CUFF REPAIR  2004   left    Family History  Problem Relation Age of Onset   Hypertension Mother    Stroke Father    Hypertension Father    Hyperlipidemia Father    Hypertension Brother    Hyperlipidemia Brother  Mental illness Brother        depression   Heart disease Maternal Grandmother        MI   Heart disease Maternal Grandfather        MI   Hyperlipidemia Brother    Hypertension Brother    Colon cancer Neg Hx    Prostate cancer Neg Hx    Breast cancer Neg Hx    Rectal cancer Neg Hx    Diabetes Neg Hx     Allergies  Allergen Reactions   Humalog [Insulin Lispro] Other (See Comments)    Sugar drops dangerously low    Lisinopril     Hyperkalemia    Viagra [Sildenafil Citrate] Other (See Comments)    headache    Current Outpatient Medications on File Prior to Visit  Medication Sig Dispense Refill   aspirin 81 MG tablet Take 81 mg by mouth daily.     atorvastatin (LIPITOR) 20 MG tablet Take 1 tablet by mouth once daily 90 tablet 1   Cholecalciferol (VITAMIN D3) 1000 UNITS CAPS Take 2,000 Units by mouth daily.     COVID-19 mRNA  vaccine, Pfizer, 30 MCG/0.3ML injection INJECT AS DIRECTED .3 mL 0   insulin detemir (LEVEMIR) 100 UNIT/ML injection INJECT 10 UNITS SUBCUTANEOUSLY AT BEDTIME 10 mL 0   metoprolol succinate (TOPROL-XL) 100 MG 24 hr tablet TAKE 1 TABLET BY MOUTH ONCE DAILY WITH MEALS OR  IMMEDIATELY  FOLLOWING  A  MEAL. 90 tablet 1   NOVOLOG 100 UNIT/ML injection INJECT 7-20 UNITS 3 TIMES A DAY WITH MEALS 40 mL 1   sertraline (ZOLOFT) 50 MG tablet Take 1 tablet (50 mg total) by mouth daily. 90 tablet 0   No current facility-administered medications on file prior to visit.    BP 120/70   Pulse (!) 50   Resp 18   Ht 5\' 7"  (1.702 m)   Wt 202 lb (91.6 kg)   SpO2 97%   BMI 31.64 kg/m        Objective:   Physical Exam   General- No acute distress. Pleasant patient. Neck- Full range of motion, no jvd. Lungs- Clear, even and unlabored. Heart- regular rate and rhythm. Neurologic- CNII- XII grossly intact.   Derm- rash on forearms(moderate), abdomen and upper chest. Moderate to severe on abdomen. On back mild rash.       Assessment & Plan:   You recently have allergic reaction to poison ivy. We gave you depo-medrol 40 mg im injection. I am also prescribing oral prednisone and hydroxyzine for itching. Your rash should gradually improve. If worsening or expanding please notify .    History of diabetes discussed benefit vs risk of prednisone in diabetes(severity of rash and diffuse area requires steroid use). Continue levimir and novolog. Eat low sugar diet and do sliding scale as we discussed.   Keep follow up with pcp in one month.  Follow up in 7 days or as needed.   [place], PA-C

## 2020-09-24 NOTE — Patient Instructions (Signed)
You recently have allergic reaction to poison ivy. We gave you depo-medrol 40 mg im injection. I am also prescribing oral prednisone and hydroxyzine for itching. Your rash should gradually improve. If worsening or expanding please notify us.    History of diabetes discussed benefit vs risk of prednisone in diabetes(severity of rash and diffuse area requires steroid use). Continue levimir and novolog. Eat low sugar diet and do sliding scale as we discussed.   Keep follow up with pcp in one month.  Follow up in 7 days or as needed.

## 2020-09-24 NOTE — Addendum Note (Signed)
Addended by: Maximino Sarin on: 09/24/2020 02:07 PM   Modules accepted: Orders

## 2020-10-04 ENCOUNTER — Other Ambulatory Visit: Payer: Self-pay | Admitting: Family Medicine

## 2020-10-10 ENCOUNTER — Ambulatory Visit: Payer: Managed Care, Other (non HMO) | Admitting: Family Medicine

## 2020-10-10 ENCOUNTER — Other Ambulatory Visit: Payer: Self-pay

## 2020-10-10 VITALS — BP 114/66 | HR 85 | Temp 97.8°F | Resp 16 | Wt 199.6 lb

## 2020-10-10 DIAGNOSIS — E103292 Type 1 diabetes mellitus with mild nonproliferative diabetic retinopathy without macular edema, left eye: Secondary | ICD-10-CM | POA: Diagnosis not present

## 2020-10-10 DIAGNOSIS — L578 Other skin changes due to chronic exposure to nonionizing radiation: Secondary | ICD-10-CM | POA: Diagnosis not present

## 2020-10-10 DIAGNOSIS — I10 Essential (primary) hypertension: Secondary | ICD-10-CM | POA: Diagnosis not present

## 2020-10-10 DIAGNOSIS — E782 Mixed hyperlipidemia: Secondary | ICD-10-CM

## 2020-10-10 DIAGNOSIS — L989 Disorder of the skin and subcutaneous tissue, unspecified: Secondary | ICD-10-CM

## 2020-10-10 LAB — COMPREHENSIVE METABOLIC PANEL
ALT: 21 U/L (ref 0–53)
AST: 19 U/L (ref 0–37)
Albumin: 4.1 g/dL (ref 3.5–5.2)
Alkaline Phosphatase: 75 U/L (ref 39–117)
BUN: 28 mg/dL — ABNORMAL HIGH (ref 6–23)
CO2: 31 mEq/L (ref 19–32)
Calcium: 9.4 mg/dL (ref 8.4–10.5)
Chloride: 103 mEq/L (ref 96–112)
Creatinine, Ser: 1.2 mg/dL (ref 0.40–1.50)
GFR: 65.83 mL/min (ref >60.00)
Glucose, Bld: 121 mg/dL — ABNORMAL HIGH (ref 70–99)
Potassium: 4.7 mEq/L (ref 3.5–5.1)
Sodium: 140 mEq/L (ref 135–145)
Total Bilirubin: 0.5 mg/dL (ref 0.2–1.2)
Total Protein: 6.6 g/dL (ref 6.0–8.3)

## 2020-10-10 LAB — CBC WITH DIFFERENTIAL/PLATELET
Basophils Absolute: 0.1 10*3/uL (ref 0.0–0.1)
Basophils Relative: 0.8 % (ref 0.0–3.0)
Eosinophils Absolute: 0.2 10*3/uL (ref 0.0–0.7)
Eosinophils Relative: 2.2 % (ref 0.0–5.0)
HCT: 39.8 % (ref 39.0–52.0)
Hemoglobin: 13.1 g/dL (ref 13.0–17.0)
Lymphocytes Relative: 18.3 % (ref 12.0–46.0)
Lymphs Abs: 1.5 10*3/uL (ref 0.7–4.0)
MCHC: 32.9 g/dL (ref 30.0–36.0)
MCV: 89.6 fl (ref 78.0–100.0)
Monocytes Absolute: 0.7 10*3/uL (ref 0.1–1.0)
Monocytes Relative: 8.7 % (ref 3.0–12.0)
Neutro Abs: 5.8 10*3/uL (ref 1.4–7.7)
Neutrophils Relative %: 70 % (ref 43.0–77.0)
Platelets: 173 10*3/uL (ref 150.0–400.0)
RBC: 4.45 Mil/uL (ref 4.22–5.81)
RDW: 12.6 % (ref 11.5–15.5)
WBC: 8.3 10*3/uL (ref 4.0–10.5)

## 2020-10-10 LAB — LIPID PANEL
Cholesterol: 194 mg/dL (ref 0–200)
HDL: 50.2 mg/dL (ref >39.00)
LDL Cholesterol: 116 mg/dL — ABNORMAL HIGH (ref 0–99)
NonHDL: 144.26
Total CHOL/HDL Ratio: 4
Triglycerides: 141 mg/dL (ref 0.0–149.0)
VLDL: 28.2 mg/dL (ref 0.0–40.0)

## 2020-10-10 LAB — TSH: TSH: 2.25 u[IU]/mL (ref 0.35–5.50)

## 2020-10-10 LAB — HEMOGLOBIN A1C: Hgb A1c MFr Bld: 7.5 % — ABNORMAL HIGH (ref 4.6–6.5)

## 2020-10-10 NOTE — Progress Notes (Signed)
Patient ID: Scott Adkins, male    DOB: [birth year]  Age: 60 y.o. MRN: [id number]    Subjective:  Subjective  HPI Scott Adkins presents for office visit today for follow up on type 1 diabetes and medication management. He reports that he has had a cut on his right wrist at work and states that he has received 3 stitches and took tetanus shot for it. He states that it has been 3 weeks since injury. He denies having limited ROM in right hand, but endorses feeling numbness of back of right hand. He denies CP/palp/SOB/HA/congestion/fevers/GI or GU c/o. Taking meds as prescribed.  He endorses checking his glucose 12 times a day and states that he has tried an insulin pump, but it was giving him too much insulin Checks glucose 12 times a day. Tried insulin pump, but it was giving him too much insulin. He states that he visited an eye doctor last year of April. He went to a urologist for hernia, but otherwise he has not been seen by a urologist for 5 years now.  He reports that he is doing better on Sertraline 50 MG for his depression. He denies any trouble with nausea or vomiting.   Review of Systems  Constitutional:  Negative for chills, fatigue and fever.  HENT:  Negative for congestion, rhinorrhea, sinus pressure, sinus pain and sore throat.   Eyes:  Negative for pain.  Respiratory:  Negative for cough and shortness of breath.   Cardiovascular:  Negative for chest pain, palpitations and leg swelling.  Gastrointestinal:  Negative for abdominal pain, blood in stool, diarrhea, nausea and vomiting.  Genitourinary:  Negative for decreased urine volume, difficulty urinating, dysuria, flank pain, frequency, penile pain and urgency.  Musculoskeletal:  Negative for back pain.  Neurological:  Negative for headaches.   History Past Medical History:  Diagnosis Date   Anemia 12/22/2015   Arthritis    Chicken pox as a child   Constipation 12/22/2016   Diabetes mellitus type I (HCC)    uses insulin pump//  managed by Dr. 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   Hyperlipidemia    Hypertension    Injury of right rotator cuff 08/16/2012   Has been seen by [facility]   Left flank pain 09/12/2015   Mumps as a child   Pain in joint, lower leg 01/24/2016   Preventative health care 02/19/2013   Sun-damaged skin 12/06/2015    He has a past surgical history that includes Refractive surgery (Right, 2007); Rotator cuff repair (2004); and insulin pump.   His family history includes Heart disease in his maternal grandfather and maternal grandmother; Hyperlipidemia in his brother, brother, and father; Hypertension in his brother, brother, father, and mother; Mental illness in his brother; Stroke in his father.He reports that he has never smoked. He has never used smokeless tobacco. He reports that he does not drink alcohol and does not use drugs.  Current Outpatient Medications on File Prior to Visit  Medication Sig Dispense Refill   aspirin 81 MG tablet Take 81 mg by mouth daily.     atorvastatin (LIPITOR) 20 MG tablet Take 1 tablet by mouth once daily 90 tablet 1   Cholecalciferol (VITAMIN D3) 1000 UNITS CAPS Take 2,000 Units by mouth daily.     hydrOXYzine (VISTARIL) 25 MG capsule Take 1 capsule (25 mg total) by mouth every 8 (eight) hours as needed for itching. 30 capsule 0   insulin detemir (LEVEMIR)  100 UNIT/ML injection INJECT 10 UNITS SUBCUTANEOUSLY AT BEDTIME 10 mL 0   metoprolol succinate (TOPROL-XL) 100 MG 24 hr tablet TAKE 1 TABLET BY MOUTH ONCE DAILY WITH MEALS OR  IMMEDIATELY  FOLLOWING  A  MEAL. 90 tablet 1   NOVOLOG 100 UNIT/ML injection INJECT 7-20 UNITS 3 TIMES A DAY WITH MEALS 40 mL 1   predniSONE (STERAPRED UNI-PAK 21 TAB) 10 MG (21) TBPK tablet Standard taper over 6 days. 21 tablet 0   sertraline (ZOLOFT) 50 MG tablet Take 1 tablet (50 mg total) by mouth daily. 90 tablet 0   No current facility-administered medications on file prior  to visit.     Objective:  Objective  Physical Exam Constitutional:      General: He is not in acute distress.    Appearance: Normal appearance. He is not ill-appearing or toxic-appearing.  HENT:     Head: Normocephalic and atraumatic.     Right Ear: Tympanic membrane, ear canal and external ear normal.     Left Ear: Tympanic membrane, ear canal and external ear normal.     Nose: No congestion or rhinorrhea.  Eyes:     Extraocular Movements: Extraocular movements intact.     Pupils: Pupils are equal, round, and reactive to light.  Cardiovascular:     Rate and Rhythm: Normal rate and regular rhythm.     Pulses: Normal pulses.     Heart sounds: Normal heart sounds. No murmur heard. Pulmonary:     Effort: Pulmonary effort is normal. No respiratory distress.     Breath sounds: Normal breath sounds. No wheezing, rhonchi or rales.  Chest:     Comments: -red and raised lesion on right axillary line of chest likely due to bug bite  Abdominal:     General: Bowel sounds are normal.     Palpations: Abdomen is soft. There is no mass.     Tenderness: There is no abdominal tenderness. There is no guarding.     Hernia: No hernia is present.  Musculoskeletal:        General: Normal range of motion.     Cervical back: Normal range of motion and neck supple.  Feet:     Right foot:     Protective Sensation: 7 sites tested.  7 sites sensed.     Left foot:     Protective Sensation: 7 sites tested.  7 sites sensed.  Skin:    General: Skin is warm and dry.  Neurological:     Mental Status: He is alert and oriented to person, place, and time.  Psychiatric:        Behavior: Behavior normal.   BP 114/66   Pulse 85   Temp 97.8 F (36.6 C)   Resp 16   Wt 199 lb 9.6 oz (90.5 kg)   SpO2 97%   BMI 31.26 kg/m  Wt Readings from Last 3 Encounters:  10/10/20 199 lb 9.6 oz (90.5 kg)  09/24/20 202 lb (91.6 kg)  03/05/20 207 lb 3.2 oz (94 kg)     Lab Results  Component Value Date   WBC 8.3  10/10/2020   HGB 13.1 10/10/2020   HCT 39.8 10/10/2020   PLT 173.0 10/10/2020   GLUCOSE 121 (H) 10/10/2020   CHOL 194 10/10/2020   TRIG 141.0 10/10/2020   HDL 50.20 10/10/2020   LDLDIRECT 71 09/10/2008   LDLCALC 116 (H) 10/10/2020   ALT 21 10/10/2020   AST 19 10/10/2020   NA 140 10/10/2020  K 4.7 10/10/2020   CL 103 10/10/2020   CREATININE 1.20 10/10/2020   BUN 28 (H) 10/10/2020   CO2 31 10/10/2020   TSH 2.25 10/10/2020   PSA 0.40 03/05/2020   HGBA1C 7.5 (H) 10/10/2020   MICROALBUR 1.8 03/05/2020    CT Angio Chest W/Cm &/Or Wo Cm  Result Date: 05/18/2017 CLINICAL DATA:  Shortness of breath with cough EXAM: CT ANGIOGRAPHY CHEST WITH CONTRAST TECHNIQUE: Multidetector CT imaging of the chest was performed using the standard protocol during bolus administration of intravenous contrast. Multiplanar CT image reconstructions and MIPs were obtained to evaluate the vascular anatomy. CONTRAST:  ISOVUE-370 IOPAMIDOL (ISOVUE-370) INJECTION 76% COMPARISON:  Chest radiograph May 17, 2017 FINDINGS: Cardiovascular: There is no demonstrable pulmonary embolus. There is no thoracic aortic aneurysm or dissection. Visualized great vessels show modest scattered areas of atherosclerotic calcification. No appreciable hemodynamically significant obstruction is seen in these vessels. There are occasional foci of coronary artery calcification. There is mild aortic atherosclerosis. There is no pericardial effusion or pericardial thickening evident. Mediastinum/Nodes: Thyroid appears unremarkable. There is no appreciable thoracic adenopathy. There is a small hiatal hernia. Lungs/Pleura: There is no edema or consolidation. There is mild patchy atelectatic change in both lower lobes. No pleural effusion or pleural thickening evident. Upper Abdomen: Visualized upper abdominal structures appear unremarkable. Musculoskeletal: No blastic or lytic bone lesions. There is a small area of myositis ossificans lateral  to the left scapula. Review of the MIP images confirms the above findings. IMPRESSION: 1. No demonstrable pulmonary embolus. No thoracic aortic aneurysm or dissection. 2.  Lower lobe atelectatic change.  No lung edema or consolidation. 3. Mild aortic atherosclerosis. Scattered foci coronary artery calcification and great vessel atherosclerotic calcification. 4.  No evident adenopathy. 5.  Small hiatal hernia. Aortic Atherosclerosis (ICD10-I70.0). Electronically Signed   By: Bretta Bang III M.D.   On: 05/18/2017 13:48     Assessment & Plan:  Plan    No orders of the defined types were placed in this encounter.   Problem List Items Addressed This Visit     Hyperlipidemia - Primary    Tolerating statin, encouraged heart healthy diet, avoid trans fats, minimize simple carbs and saturated fats. Increase exercise as tolerated       Relevant Orders   Comprehensive metabolic panel (Completed)   CBC with Differential/Platelet (Completed)   Lipid panel (Completed)   Essential hypertension    Well controlled, no changes to meds. Encouraged heart healthy diet such as the DASH diet and exercise as tolerated.        Relevant Orders   Comprehensive metabolic panel (Completed)   CBC with Differential/Platelet (Completed)   TSH (Completed)   DM (diabetes mellitus), type 1 with ophthalmic complications (HCC)    hgba1c acceptable, minimize simple carbs. Increase exercise as tolerated. Continue current meds. He is overdue for his opthamology exam and he agrees to proceed        Relevant Orders   Hemoglobin A1c (Completed)   Sun-damaged skin     Referred to dermatology for further evaluation           Relevant Orders   Ambulatory referral to Dermatology   Other Visit Diagnoses     Skin lesion of chest wall       Relevant Orders   Ambulatory referral to Dermatology       Follow-up: Return 3 mn f/u and 6-7 month CPE.   I, Billie Lade, acting as a scribe for Danise Edge, MD,  have  documented all relevent documentation on behalf of Danise Edge, MD, as directed by Danise Edge, MD while in the presence of Danise Edge, MD.  I, Bradd Canary, MD personally performed the services described in this documentation. All medical record entries made by the scribe were at my direction and in my presence. I have reviewed the chart and agree that the record reflects my personal performance and is accurate and complete

## 2020-10-10 NOTE — Patient Instructions (Addendum)
Need annual eye exam   Paxlovid is the new COVID medication we can give you if you get COVID so make sure you test if you have symptoms because we have to treat by day 5 of symptoms for it to be effective. If you are positive let us know so we can treat. If a home test is negative and your symptoms are persistent get a PCR test. Can check testing locations at [email or web address] If you are positive we will make an appointment with [place] and we will send in Paxlovid if you would like it. Check with your pharmacy before we meet to confirm they have it in stock, if they do not then we can get the prescription at the [facility]   High Cholesterol  High cholesterol is a condition in which the blood has high levels of a white, waxy substance similar to fat (cholesterol). The liver makes all the cholesterol that the body needs. The human body needs small amounts of cholesterol to help build cells. A person gets extra orexcess cholesterol from the food that he or she eats. The blood carries cholesterol from the liver to the rest of the body. If you have high cholesterol, deposits (plaques) may build up on the walls of your arteries. Arteries are the blood vessels that carry blood away from your heart. These plaques make the arteries narrowand stiff. Cholesterol plaques increase your risk for heart attack and stroke. Work withyour health care provider to keep your cholesterol levels in a healthy range. What increases the risk? The following factors may make you more likely to develop this condition: Eating foods that are high in animal fat (saturated fat) or cholesterol. Being overweight. Not getting enough exercise. A family history of high cholesterol (familial hypercholesterolemia). Use of tobacco products. Having diabetes. What are the signs or symptoms? There are no symptoms of this condition. How is this diagnosed? This condition may be diagnosed based on the results of a blood  test. If you are older than 60 years of age, your health care provider may check your cholesterol levels every 4-6 years. You may be checked more often if you have high cholesterol or other risk factors for heart disease. The blood test for cholesterol measures: "Bad" cholesterol, or LDL cholesterol. This is the main type of cholesterol that causes heart disease. The desired level is less than 100 mg/dL. "Good" cholesterol, or HDL cholesterol. HDL helps protect against heart disease by cleaning the arteries and carrying the LDL to the liver for processing. The desired level for HDL is 60 mg/dL or higher. Triglycerides. These are fats that your body can store or burn for energy. The desired level is less than 150 mg/dL. Total cholesterol. This measures the total amount of cholesterol in your blood and includes LDL, HDL, and triglycerides. The desired level is less than 200 mg/dL. How is this treated? This condition may be treated with: Diet changes. You may be asked to eat foods that have more fiber and less saturated fats or added sugar. Lifestyle changes. These may include regular exercise, maintaining a healthy weight, and quitting use of tobacco products. Medicines. These are given when diet and lifestyle changes have not worked. You may be prescribed a statin medicine to help lower your cholesterol levels. Follow these instructions at home: Eating and drinking  Eat a healthy, balanced diet. This diet includes: Daily servings of a variety of fresh, frozen, or canned fruits and vegetables. Daily servings of whole grain foods that  are rich in fiber. Foods that are low in saturated fats and trans fats. These include poultry and fish without skin, lean cuts of meat, and low-fat dairy products. A variety of fish, especially oily fish that contain omega-3 fatty acids. Aim to eat fish at least 2 times a week. Avoid foods and drinks that have added sugar. Use healthy cooking methods, such as  roasting, grilling, broiling, baking, poaching, steaming, and stir-frying. Do not fry your food except for stir-frying.  Lifestyle  Get regular exercise. Aim to exercise for a total of 150 minutes a week. Increase your activity level by doing activities such as gardening, walking, and taking the stairs. Do not use any products that contain nicotine or tobacco, such as cigarettes, e-cigarettes, and chewing tobacco. If you need help quitting, ask your health care provider.  General instructions Take over-the-counter and prescription medicines only as told by your health care provider. Keep all follow-up visits as told by your health care provider. This is important. Where to find more information American Heart Association: www.heart.org National Heart, Lung, and Blood Institute: [employer].is Contact a health care provider if: You have trouble achieving or maintaining a healthy diet or weight. You are starting an exercise program. You are unable to stop smoking. Get help right away if: You have chest pain. You have trouble breathing. You have any symptoms of a stroke. "BE FAST" is an easy way to remember the main warning signs of a stroke: B - Balance. Signs are dizziness, sudden trouble walking, or loss of balance. E - Eyes. Signs are trouble seeing or a sudden change in vision. F - Face. Signs are sudden weakness or numbness of the face, or the face or eyelid drooping on one side. A - Arms. Signs are weakness or numbness in an arm. This happens suddenly and usually on one side of the body. S - Speech. Signs are sudden trouble speaking, slurred speech, or trouble understanding what people say. T - Time. Time to call emergency services. Write down what time symptoms started. You have other signs of a stroke, such as: A sudden, severe headache with no known cause. Nausea or vomiting. Seizure. These symptoms may represent a serious problem that is an emergency. Do not wait to see if  the symptoms will go away. Get medical help right away. Call your local emergency services (911 in the [place] Do not drive yourself to the hospital. Summary Cholesterol plaques increase your risk for heart attack and stroke. Work with your health care provider to keep your cholesterol levels in a healthy range. Eat a healthy, balanced diet, get regular exercise, and maintain a healthy weight. Do not use any products that contain nicotine or tobacco, such as cigarettes, e-cigarettes, and chewing tobacco. Get help right away if you have any symptoms of a stroke. This information is not intended to replace advice given to you by your health care provider. Make sure you discuss any questions you have with your healthcare provider. Document Revised: 02/20/2019 Document Reviewed: 02/20/2019 Elsevier Patient Education  2022 [employer].

## 2020-10-10 NOTE — Assessment & Plan Note (Signed)
Tolerating statin, encouraged heart healthy diet, avoid trans fats, minimize simple carbs and saturated fats. Increase exercise as tolerated 

## 2020-10-10 NOTE — Assessment & Plan Note (Signed)
Well controlled, no changes to meds. Encouraged heart healthy diet such as the DASH diet and exercise as tolerated.  °

## 2020-10-13 NOTE — Assessment & Plan Note (Signed)
Referred to dermatology for further evaluation.

## 2020-10-13 NOTE — Assessment & Plan Note (Signed)
hgba1c acceptable, minimize simple carbs. Increase exercise as tolerated. Continue current meds. He is overdue for his opthamology exam and he agrees to proceed

## 2020-10-19 ENCOUNTER — Other Ambulatory Visit: Payer: Self-pay | Admitting: Family Medicine

## 2020-11-11 ENCOUNTER — Other Ambulatory Visit: Payer: Self-pay | Admitting: Family Medicine

## 2020-11-18 ENCOUNTER — Other Ambulatory Visit: Payer: Self-pay | Admitting: Family Medicine

## 2020-12-12 ENCOUNTER — Telehealth: Payer: Self-pay | Admitting: *Deleted

## 2020-12-12 NOTE — Telephone Encounter (Addendum)
Called insurance today 12/18/20 and prior Berkley Harvey was never started via cover my med.  Prior auth started over phone.  Case ID : [id number].  It will take up to 5 business days for a determination.

## 2020-12-18 ENCOUNTER — Other Ambulatory Visit: Payer: Self-pay | Admitting: Family Medicine

## 2020-12-30 ENCOUNTER — Telehealth: Payer: Self-pay | Admitting: *Deleted

## 2020-12-30 NOTE — Telephone Encounter (Signed)
Patient went to ED

## 2020-12-30 NOTE — Telephone Encounter (Signed)
Insurance denied Novolog on 12/18/20 and we did not receive notification.  Insurance covers Humalog and Lyumjev.

## 2020-12-30 NOTE — Telephone Encounter (Signed)
Who Is Calling Patient / Member / Family / Caregiver Call Type Triage / Clinical Relationship To Patient Self Return Phone Number [phone] (Primary) Chief Complaint EYE - something stuck (except contacts) or splashed in eye Reason for Call Symptomatic / Request for Health Information Initial Comment Caller states he has something in his right eye that he can not remove. GOTO Facility Not Listed [facility] ER Translation No Nurse Assessment Nurse: Allie Bossier, RN, [place] Date/Time Lamount Cohen Time): 12/30/2020 7:37:05 AM Confirm and document reason for call. If symptomatic, describe symptoms. ---Caller states he has something in his right eye that he can not remove. It occurred yesterday while he was mowing grass. He can feel it but not see it. Rates pain 10/10.  12/30/2020 7:41:33 AM Go to ED Now Yes Allie Bossier, RN, [place]

## 2020-12-31 NOTE — Telephone Encounter (Signed)
Tried a renewal from last PA.  Key: [id number]

## 2021-01-02 ENCOUNTER — Encounter: Payer: Self-pay | Admitting: *Deleted

## 2021-01-08 NOTE — Telephone Encounter (Signed)
Appeal letter faxed on 01/02/21

## 2021-01-14 ENCOUNTER — Other Ambulatory Visit (HOSPITAL_BASED_OUTPATIENT_CLINIC_OR_DEPARTMENT_OTHER): Payer: Self-pay

## 2021-01-14 ENCOUNTER — Ambulatory Visit: Payer: Managed Care, Other (non HMO) | Admitting: Family Medicine

## 2021-01-14 ENCOUNTER — Other Ambulatory Visit: Payer: Self-pay

## 2021-01-14 ENCOUNTER — Encounter: Payer: Self-pay | Admitting: Family Medicine

## 2021-01-14 VITALS — BP 124/66 | HR 60 | Temp 98.2°F | Resp 16 | Wt 205.2 lb

## 2021-01-14 DIAGNOSIS — E782 Mixed hyperlipidemia: Secondary | ICD-10-CM

## 2021-01-14 DIAGNOSIS — E103292 Type 1 diabetes mellitus with mild nonproliferative diabetic retinopathy without macular edema, left eye: Secondary | ICD-10-CM | POA: Diagnosis not present

## 2021-01-14 DIAGNOSIS — Z23 Encounter for immunization: Secondary | ICD-10-CM

## 2021-01-14 DIAGNOSIS — F418 Other specified anxiety disorders: Secondary | ICD-10-CM

## 2021-01-14 DIAGNOSIS — R252 Cramp and spasm: Secondary | ICD-10-CM | POA: Diagnosis not present

## 2021-01-14 DIAGNOSIS — H547 Unspecified visual loss: Secondary | ICD-10-CM

## 2021-01-14 DIAGNOSIS — I10 Essential (primary) hypertension: Secondary | ICD-10-CM

## 2021-01-14 MED ORDER — INSULIN ASPART 100 UNIT/ML IJ SOLN
20.0000 [IU] | Freq: Three times a day (TID) | INTRAMUSCULAR | 5 refills | Status: DC
  Filled 2021-01-14: qty 20, 34d supply, fill #0

## 2021-01-14 NOTE — Progress Notes (Signed)
Patient ID: Scott Adkins, male    DOB: 08/15/1960  Age: 60 y.o. MRN: [id number]    Subjective:   Chief Complaint  Patient presents with   3 MONTHS FOLLOW UP   Subjective   HPI Scott Adkins presents for office visit today for follow up on recent ER visit and type 1 diabetes. He scratched his right cornea in an accident which he had to go to the ER for. He denies any new visual defects in his right eye. Denies CP/palp/SOB/HA/congestion/fevers/GI or GU c/o. Taking meds as prescribed.  This morning he reported a glucose reading of 257, but right before his visit he measured it again and it was 81.   Review of Systems  Constitutional:  Negative for chills, fatigue and fever.  HENT:  Negative for congestion, rhinorrhea, sinus pressure, sinus pain and sore throat.   Eyes:  Negative for pain.  Respiratory:  Negative for cough and shortness of breath.   Cardiovascular:  Negative for chest pain, palpitations and leg swelling.  Gastrointestinal:  Negative for abdominal pain, blood in stool, diarrhea, nausea and vomiting.  Genitourinary:  Negative for flank pain, frequency and penile pain.  Musculoskeletal:  Negative for back pain.  Neurological:  Negative for headaches.   History Past Medical History:  Diagnosis Date   Anemia 12/22/2015   Arthritis    Chicken pox as a child   Constipation 12/22/2016   Diabetes mellitus type I (HCC)    uses insulin pump// managed by Dr. 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   Hyperlipidemia    Hypertension    Injury of right rotator cuff 08/16/2012   Has been seen by [facility]   Left flank pain 09/12/2015   Mumps as a child   Pain in joint, lower leg 01/24/2016   Preventative health care 02/19/2013   Sun-damaged skin 12/06/2015    He has a past surgical history that includes Refractive surgery (Right, 2007); Rotator cuff repair (2004); and insulin pump.   His family history includes Heart disease  in his maternal grandfather and maternal grandmother; Hyperlipidemia in his brother, brother, and father; Hypertension in his brother, brother, father, and mother; Mental illness in his brother; Stroke in his father.He reports that he has never smoked. He has never used smokeless tobacco. He reports that he does not drink alcohol and does not use drugs.  Current Outpatient Medications on File Prior to Visit  Medication Sig Dispense Refill   aspirin 81 MG tablet Take 81 mg by mouth daily.     atorvastatin (LIPITOR) 20 MG tablet Take 1 tablet by mouth once daily 90 tablet 0   Cholecalciferol (VITAMIN D3) 1000 UNITS CAPS Take 2,000 Units by mouth daily.     hydrOXYzine (VISTARIL) 25 MG capsule Take 1 capsule (25 mg total) by mouth every 8 (eight) hours as needed for itching. 30 capsule 0   insulin detemir (LEVEMIR) 100 UNIT/ML injection INJECT 10 UNITS SUBCUTANEOUSLY AT BEDTIME 10 mL 3   metoprolol succinate (TOPROL-XL) 100 MG 24 hr tablet TAKE 1 TABLET BY MOUTH ONCE DAILY WITH MEALS OR  IMMEDIATELY  FOLLOWING  A  MEAL 90 tablet 0   predniSONE (STERAPRED UNI-PAK 21 TAB) 10 MG (21) TBPK tablet Standard taper over 6 days. 21 tablet 0   sertraline (ZOLOFT) 50 MG tablet Take 1 tablet by mouth once daily 90 tablet 0   No current facility-administered medications on file prior to visit.     Objective:  Objective  Physical Exam BP 124/66   Pulse 60   Temp 98.2 F (36.8 C)   Resp 16   Wt 205 lb 3.2 oz (93.1 kg)   SpO2 95%   BMI 32.14 kg/m  Wt Readings from Last 3 Encounters:  01/14/21 205 lb 3.2 oz (93.1 kg)  10/10/20 199 lb 9.6 oz (90.5 kg)  09/24/20 202 lb (91.6 kg)     Lab Results  Component Value Date   WBC 7.3 01/14/2021   HGB 13.3 01/14/2021   HCT 41.0 01/14/2021   PLT 179.0 01/14/2021   GLUCOSE 113 (H) 01/14/2021   CHOL 151 01/14/2021   TRIG 173.0 (H) 01/14/2021   HDL 48.40 01/14/2021   LDLDIRECT 71 09/10/2008   LDLCALC 68 01/14/2021   ALT 30 01/14/2021   AST 26 01/14/2021    NA 138 01/14/2021   K 4.1 01/14/2021   CL 104 01/14/2021   CREATININE 0.86 01/14/2021   BUN 21 01/14/2021   CO2 27 01/14/2021   TSH 4.68 01/14/2021   PSA 0.40 03/05/2020   HGBA1C 7.1 (H) 01/14/2021   MICROALBUR 1.8 03/05/2020    CT Angio Chest W/Cm &/Or Wo Cm  Result Date: 05/18/2017 CLINICAL DATA:  Shortness of breath with cough EXAM: CT ANGIOGRAPHY CHEST WITH CONTRAST TECHNIQUE: Multidetector CT imaging of the chest was performed using the standard protocol during bolus administration of intravenous contrast. Multiplanar CT image reconstructions and MIPs were obtained to evaluate the vascular anatomy. CONTRAST:  ISOVUE-370 IOPAMIDOL (ISOVUE-370) INJECTION 76% COMPARISON:  Chest radiograph May 17, 2017 FINDINGS: Cardiovascular: There is no demonstrable pulmonary embolus. There is no thoracic aortic aneurysm or dissection. Visualized great vessels show modest scattered areas of atherosclerotic calcification. No appreciable hemodynamically significant obstruction is seen in these vessels. There are occasional foci of coronary artery calcification. There is mild aortic atherosclerosis. There is no pericardial effusion or pericardial thickening evident. Mediastinum/Nodes: Thyroid appears unremarkable. There is no appreciable thoracic adenopathy. There is a small hiatal hernia. Lungs/Pleura: There is no edema or consolidation. There is mild patchy atelectatic change in both lower lobes. No pleural effusion or pleural thickening evident. Upper Abdomen: Visualized upper abdominal structures appear unremarkable. Musculoskeletal: No blastic or lytic bone lesions. There is a small area of myositis ossificans lateral to the left scapula. Review of the MIP images confirms the above findings. IMPRESSION: 1. No demonstrable pulmonary embolus. No thoracic aortic aneurysm or dissection. 2.  Lower lobe atelectatic change.  No lung edema or consolidation. 3. Mild aortic atherosclerosis. Scattered foci  coronary artery calcification and great vessel atherosclerotic calcification. 4.  No evident adenopathy. 5.  Small hiatal hernia. Aortic Atherosclerosis (ICD10-I70.0). Electronically Signed   By: Bretta Bang III M.D.   On: 05/18/2017 13:48     Assessment & Plan:  Plan    Meds ordered this encounter  Medications   insulin aspart (NOVOLOG) 100 UNIT/ML injection    Sig: Inject 20 Units into the skin 3 (three) times daily before meals.    Dispense:  40 mL    Refill:  5    Humalog causes severe life threatening drops in Glucose    Problem List Items Addressed This Visit     Hyperlipidemia    Encourage heart healthy diet such as MIND or DASH diet, increase exercise, avoid trans fats, simple carbohydrates and processed foods, consider a krill or fish or flaxseed oil cap daily. Tolerating Atorvastatin      Relevant Orders   CBC (Completed)   Lipid panel (Completed)  TSH (Completed)   Depression with anxiety    Doing well on Sertraline no changes      Essential hypertension    Well controlled, no changes to meds. Encouraged heart healthy diet such as the DASH diet and exercise as tolerated.       DM (diabetes mellitus), type 1 with ophthalmic complications (HCC)    hgba1c acceptable, minimize simple carbs. Increase exercise as tolerated. Continue current meds      Relevant Medications   insulin aspart (NOVOLOG) 100 UNIT/ML injection   Other Relevant Orders   Ambulatory referral to Ophthalmology   Hemoglobin A1c (Completed)   CBC (Completed)   Comprehensive metabolic panel (Completed)   TSH (Completed)   Muscle cramping   Relevant Orders   CBC (Completed)   TSH (Completed)   Magnesium (Completed)   Decreased visual acuity    Had a recent scratched cornea but has resolved now. He is referred to opthamology for diabetic eye exam and evaluation      Relevant Orders   Ambulatory referral to Ophthalmology   Other Visit Diagnoses     Need for influenza vaccination     -  Primary   Relevant Orders   Flu Vaccine QUAD 36+ mos IM (Fluarix, Fluzone & Afluria Quad PF (Completed)       Follow-up: Return in about 3 months (around 04/16/2021) for f/u visit in 3-4 months, then 90 days after plan for annual CPE (around March-April 2023).  I, Billie Lade, acting as a scribe for Danise Edge, MD, have documented all relevent documentation on behalf of Danise Edge, MD, as directed by Danise Edge, MD while in the presence of Danise Edge, MD. DO:01/15/21.  I, Bradd Canary, MD personally performed the services described in this documentation. All medical record entries made by the scribe were at my direction and in my presence. I have reviewed the chart and agree that the record reflects my personal performance and is accurate and complete

## 2021-01-14 NOTE — Patient Instructions (Signed)
New covid booster is available with walk-ins at the [facility] downstairs Mon-Fri, 9am-4pm or your preferred pharmacy.  Molnupiravir/Paxlovid is the new COVID medication we can give you if you get COVID so make sure you test if you have symptoms because we have to treat by day 5 of symptoms for it to be effective. If you are positive let us know so we can treat. If a home test is negative and your symptoms are persistent get a PCR test. Can check testing locations at [email or web address] If you are positive we will make an appointment with [place] and we will send in molnupiravir/paxlovid if you would like it. Check with your pharmacy before we meet to confirm they have it in stock, if they do not then we can get the prescription at the [facility].   Type 1 Diabetes Mellitus, Self-Care, Adult Caring for yourself after you have been diagnosed with type 1 diabetes (type 1 diabetes mellitus) means keeping your blood sugar (glucose) under control with a balance of: Insulin. Nutrition. Exercise. Lifestyle changes. Other medicines, if needed. Support from your team of health care providers and others. The following information explains what you need to know to manage your diabetes at home. What are the risks? Having diabetes puts you at higher risk for other long-term (chronic) conditions, such as heart disease and kidney disease. These problems can get worse if you do not take good care of yourself and keep your blood glucose under control. How to monitor blood glucose  Check your blood glucose every day, as often as told by your health care provider. Have your A1C (hemoglobin A1C) level checked two or more times a year, or as often as told by your health care provider. Your health care provider will set individualized treatment goals for you. Generally, the goal of treatment is to maintain the following blood glucose levels: Before meals (preprandial): 80-130 mg/dL (4.4-7.2  mmol/L). After meals (postprandial): below 180 mg/dL (10 mmol/L). Hemoglobin A1C level: less than 7%. Follow these instructions at home: Medicines Take over-the-counter and prescription medicines only as told by your health care provider. Take your insulin and other medicines every day as told. Do not run out of insulin or other medicines that you take. Plan ahead so you always have these available. Adjust your insulin dosage based on how physically active you are and what foods you eat. Your health care provider will tell you how to adjust your dosage. Eating and drinking The things that you eat and drink affect your blood glucose and your insulin dosage. Making good choices helps to control your diabetes and prevent other health problems. A healthy meal plan includes eating lean proteins, complex carbohydrates, fresh fruits and vegetables, low-fat dairy products, and healthy fats. Make an appointment to see a registered dietitian to help you create an eating plan that is right for you. Make sure that you: Follow instructions from your health care provider about eating or drinking restrictions. Drink enough fluid to keep your urine pale yellow. Keep a record of the carbohydrates that you eat. Do this by reading food labels and learning the standard serving sizes of foods. Follow your sick-day plan whenever you cannot eat or drink as usual. Make this plan in advance with your health care provider. Always have a 15-gram rapid-acting carbohydrate snack with you to treat low blood glucose (hypoglycemia). Activity Exercise regularly, as told by your health care provider. This may include: Stretching and doing strength exercises, such as yoga or  weight lifting, 2 or more times a week. Doing 150 minutes or more of moderate-intensity exercise or 75 minutes or more of vigorous-intensity exercise each week. This could be brisk walking, biking, or water aerobics. Spread out your activity over 3 or more  days of the week. Do not go more than 2 days in a row without doing some kind of physical activity. When you start a new exercise or activity, work with your health care provider to adjust your insulin or food intake as needed. Lifestyle Do not use any products that contain nicotine or tobacco, such as cigarettes, e-cigarettes, and chewing tobacco. If you need help quitting, ask your health care provider. If your health care provider says that alcohol is safe for you: Limit how much you use to: 0-1 drink a day for women who are not pregnant. 0-2 drinks a day for men. Be aware of how much alcohol is in your drink. In the [place] one drink equals one 12 oz bottle of beer (355 mL), one 5 oz glass of wine (148 mL), or one 1 oz glass of hard liquor (44 mL). Learn to manage stress. If you need help with this, ask your health care provider. Care for your body  Keep your immunizations up to date. In addition to getting vaccinations as told by your health care provider, it is recommended that you get vaccinated against: The flu (influenza). Get a flu shot every year. Pneumonia. Hepatitis B. Schedule an eye exam within 3-5 years after your diagnosis, and then one time every year after that. Check your skin and feet every day for cuts, bruises, redness, blisters, or sores. Schedule a complete foot exam 3-5 years after your diagnosis, and then every year after the first exam. Brush your teeth and gums two times a day, and floss one or more times a day. Visit your dentist one or more times every 6 months. Maintain a healthy weight. General instructions Share your diabetes management plan with people at work, school, and home. Family members and close friends should learn the symptoms of hypoglycemia and should understand how to treat it in case you are not able to treat yourself. Check your urine for ketones when you are ill and as told by your health care provider. Carry a medical alert card or wear  medical alert jewelry. Keep all follow-up visits as told by your health care provider. This is important. Questions to ask your health care provider Do I need to meet with a certified diabetes care and education specialist? Where can I find a support group for people with diabetes? Do I need to have an emergency glucagon kit available? Where to find more information American Diabetes Association (ADA): diabetes.org Association of Diabetes Care and Education Specialists (ADCES): diabeteseducator.org International Diabetes Federation (IDF): https://www.munoz-bell.org/ Get help right away if: Your blood glucose level is below 54 mg/dL (3 mmol/L). You have moderate or large ketone levels in your urine. These symptoms may represent a serious problem that is an emergency. Do not wait to see if the symptoms will go away. Get medical help right away. Call your local emergency services (911 in the [place] Do not drive yourself to the hospital. Summary Caring for yourself after you have been diagnosed with type 1 diabetes (type 1 diabetes mellitus) means keeping your blood sugar (glucose) under control. You can do that with a balance of insulin and other medicines, nutrition, exercise, lifestyle changes, and support from others. Having diabetes puts you at higher risk for  other health problems. These can get worse if you do not take good care of yourself and keep your blood glucose under control. Check your blood glucose every day, as often as told by your health care provider. Keep all follow-up visits as told by your health care provider. This is important. This information is not intended to replace advice given to you by your health care provider. Make sure you discuss any questions you have with your health care provider. Document Revised: 05/11/2019 Document Reviewed: 05/11/2019 Elsevier Patient Education  [employer].

## 2021-01-15 ENCOUNTER — Other Ambulatory Visit (HOSPITAL_BASED_OUTPATIENT_CLINIC_OR_DEPARTMENT_OTHER): Payer: Self-pay

## 2021-01-15 DIAGNOSIS — H547 Unspecified visual loss: Secondary | ICD-10-CM | POA: Insufficient documentation

## 2021-01-15 LAB — CBC
HCT: 41 % (ref 39.0–52.0)
Hemoglobin: 13.3 g/dL (ref 13.0–17.0)
MCHC: 32.4 g/dL (ref 30.0–36.0)
MCV: 89.7 fl (ref 78.0–100.0)
Platelets: 179 10*3/uL (ref 150.0–400.0)
RBC: 4.57 Mil/uL (ref 4.22–5.81)
RDW: 12.4 % (ref 11.5–15.5)
WBC: 7.3 10*3/uL (ref 4.0–10.5)

## 2021-01-15 LAB — LIPID PANEL
Cholesterol: 151 mg/dL (ref 0–200)
HDL: 48.4 mg/dL (ref >39.00)
LDL Cholesterol: 68 mg/dL (ref 0–99)
NonHDL: 102.28
Total CHOL/HDL Ratio: 3
Triglycerides: 173 mg/dL — ABNORMAL HIGH (ref 0.0–149.0)
VLDL: 34.6 mg/dL (ref 0.0–40.0)

## 2021-01-15 LAB — COMPREHENSIVE METABOLIC PANEL
ALT: 30 U/L (ref 0–53)
AST: 26 U/L (ref 0–37)
Albumin: 4.1 g/dL (ref 3.5–5.2)
Alkaline Phosphatase: 82 U/L (ref 39–117)
BUN: 21 mg/dL (ref 6–23)
CO2: 27 mEq/L (ref 19–32)
Calcium: 9.4 mg/dL (ref 8.4–10.5)
Chloride: 104 mEq/L (ref 96–112)
Creatinine, Ser: 0.86 mg/dL (ref 0.40–1.50)
GFR: 94.09 mL/min (ref >60.00)
Glucose, Bld: 113 mg/dL — ABNORMAL HIGH (ref 70–99)
Potassium: 4.1 mEq/L (ref 3.5–5.1)
Sodium: 138 mEq/L (ref 135–145)
Total Bilirubin: 0.4 mg/dL (ref 0.2–1.2)
Total Protein: 6.8 g/dL (ref 6.0–8.3)

## 2021-01-15 LAB — TSH: TSH: 4.68 u[IU]/mL (ref 0.35–5.50)

## 2021-01-15 LAB — MAGNESIUM: Magnesium: 2 mg/dL (ref 1.5–2.5)

## 2021-01-15 LAB — HEMOGLOBIN A1C: Hgb A1c MFr Bld: 7.1 % — ABNORMAL HIGH (ref 4.6–6.5)

## 2021-01-15 NOTE — Assessment & Plan Note (Signed)
Doing well on Sertraline no changes 

## 2021-01-15 NOTE — Assessment & Plan Note (Signed)
Encourage heart healthy diet such as MIND or DASH diet, increase exercise, avoid trans fats, simple carbohydrates and processed foods, consider a krill or fish or flaxseed oil cap daily. Tolerating Atorvastatin 

## 2021-01-15 NOTE — Assessment & Plan Note (Signed)
Had a recent scratched cornea but has resolved now. He is referred to opthamology for diabetic eye exam and evaluation

## 2021-01-15 NOTE — Assessment & Plan Note (Signed)
hgba1c acceptable, minimize simple carbs. Increase exercise as tolerated. Continue current meds 

## 2021-01-15 NOTE — Assessment & Plan Note (Signed)
Well controlled, no changes to meds. Encouraged heart healthy diet such as the DASH diet and exercise as tolerated.  °

## 2021-01-17 ENCOUNTER — Other Ambulatory Visit (HOSPITAL_BASED_OUTPATIENT_CLINIC_OR_DEPARTMENT_OTHER): Payer: Self-pay

## 2021-01-25 ENCOUNTER — Encounter: Payer: Self-pay | Admitting: Family Medicine

## 2021-01-28 ENCOUNTER — Other Ambulatory Visit: Payer: Self-pay

## 2021-01-28 ENCOUNTER — Ambulatory Visit: Payer: Managed Care, Other (non HMO) | Admitting: Family

## 2021-01-28 ENCOUNTER — Other Ambulatory Visit: Payer: Self-pay | Admitting: Family

## 2021-01-28 VITALS — BP 136/70 | HR 58 | Temp 97.8°F | Ht 67.0 in | Wt 198.6 lb

## 2021-01-28 DIAGNOSIS — J019 Acute sinusitis, unspecified: Secondary | ICD-10-CM

## 2021-01-28 MED ORDER — AMOXICILLIN-POT CLAVULANATE 875-125 MG PO TABS: 1.0000 | ORAL_TABLET | Freq: Two times a day (BID) | ORAL | 0 refills | Status: AC

## 2021-01-28 MED ORDER — FLUTICASONE PROPIONATE 50 MCG/ACT NA SUSP: 2.0000 | Freq: Every day | NASAL | 1 refills | Status: DC

## 2021-01-28 NOTE — Progress Notes (Signed)
Scott Adkins is a 60 y.o. male with the following history as recorded in Epic[facility]:  Patient Active Problem List   Diagnosis Date Noted   Decreased visual acuity 01/15/2021   Peripheral neuropathy 09/08/2018   Insomnia 01/16/2018   Hyperkalemia 01/16/2018   Constipation 12/22/2016   Pain in joint, lower leg 01/24/2016   Anemia 12/22/2015   Sun-damaged skin 12/06/2015   Left flank pain 09/12/2015   Muscle cramping 11/24/2014   DM (diabetes mellitus), type 1 with ophthalmic complications (HCC) 09/05/2014   Back pain without radiation 09/05/2013   Low libido 08/14/2013   Chicken pox    Mumps    Preventative health care 02/19/2013   Injury of right rotator cuff 08/16/2012   Asthma 06/01/2012   Paresthesia 02/17/2012   CONTACT DERMATITIS 10/01/2008   Depression with anxiety 09/17/2008   Hyperlipidemia 07/02/2008   Essential hypertension 07/02/2008   ALLERGIC RHINITIS 07/02/2008    Current Outpatient Medications  Medication Sig Dispense Refill   amoxicillin-clavulanate (AUGMENTIN) 875-125 MG tablet Take 1 tablet by mouth 2 (two) times daily for 10 days. 20 tablet 0   aspirin 81 MG tablet Take 81 mg by mouth daily.     atorvastatin (LIPITOR) 20 MG tablet Take 1 tablet by mouth once daily 90 tablet 0   Cholecalciferol (VITAMIN D3) 1000 UNITS CAPS Take 2,000 Units by mouth daily.     fluticasone (FLONASE) 50 MCG/ACT nasal spray Place 2 sprays into both nostrils daily. 16 g 1   insulin aspart (NOVOLOG) 100 UNIT/ML injection Inject 20 Units into the skin 3 (three) times daily before meals. 40 mL 5   insulin detemir (LEVEMIR) 100 UNIT/ML injection INJECT 10 UNITS SUBCUTANEOUSLY AT BEDTIME 10 mL 3   metoprolol succinate (TOPROL-XL) 100 MG 24 hr tablet TAKE 1 TABLET BY MOUTH ONCE DAILY WITH MEALS OR  IMMEDIATELY  FOLLOWING  A  MEAL 90 tablet 0   sertraline (ZOLOFT) 50 MG tablet Take 1 tablet by mouth once daily 90 tablet 0   No current facility-administered medications for this visit.     Allergies: Humalog [insulin lispro], Lisinopril, and Viagra [sildenafil citrate]  Past Medical History:  Diagnosis Date   Anemia 12/22/2015   Arthritis    Chicken pox as a child   Constipation 12/22/2016   Diabetes mellitus type I (HCC)    uses insulin pump// managed by Dr. 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   Hyperlipidemia    Hypertension    Injury of right rotator cuff 08/16/2012   Has been seen by [facility]   Left flank pain 09/12/2015   Mumps as a child   Pain in joint, lower leg 01/24/2016   Preventative health care 02/19/2013   Sun-damaged skin 12/06/2015    Past Surgical History:  Procedure Laterality Date   insulin pump     placed and removed   REFRACTIVE SURGERY Right 2007   [facility]   ROTATOR CUFF REPAIR  2004   left    Family History  Problem Relation Age of Onset   Hypertension Mother    Stroke Father    Hypertension Father    Hyperlipidemia Father    Hypertension Brother    Hyperlipidemia Brother    Mental illness Brother        depression   Heart disease Maternal Grandmother        MI   Heart disease Maternal Grandfather        MI  Hyperlipidemia Brother    Hypertension Brother    Colon cancer Neg Hx    Prostate cancer Neg Hx    Breast cancer Neg Hx    Rectal cancer Neg Hx    Diabetes Neg Hx     Social History   Tobacco Use   Smoking status: Never   Smokeless tobacco: Never  Substance Use Topics   Alcohol use: No    Alcohol/week: 1.0 standard drink    Types: 1 Shots of liquor per week    Subjective:  Patient presents with concerns for possible sinus infection; symptoms x 1 week; +blood tinged mucus; prone to sinus infections approximately once per year;       Objective:  Vitals:   01/28/21 1116  BP: 136/70  Pulse: (!) 58  Temp: 97.8 F (36.6 C)  TempSrc: Oral  SpO2: 98%  Weight: 198 lb 9.6 oz (90.1 kg)  Height: 5\' 7"  (1.702 m)    General: Well developed, well  nourished, in no acute distress  Skin : Warm and dry.  Head: Normocephalic and atraumatic  Lungs: Respirations unlabored;  Neurologic: Alert and oriented; speech intact; face symmetrical; moves all extremities well; CNII-XII intact without focal deficit   Assessment:  1. Acute sinusitis, recurrence not specified, unspecified location     Plan:  Rx for Augmentin and Flonase; increase fluids rest and follow up worse, no better;   This visit occurred during the SARS-CoV-2 public health emergency.  Safety protocols were in place, including screening questions prior to the visit, additional usage of staff PPE, and extensive cleaning of exam room while observing appropriate contact time as indicated for disinfecting solutions.    No follow-ups on file.  No orders of the defined types were placed in this encounter.   Requested Prescriptions   Signed Prescriptions Disp Refills   amoxicillin-clavulanate (AUGMENTIN) 875-125 MG tablet 20 tablet 0    Sig: Take 1 tablet by mouth 2 (two) times daily for 10 days.   fluticasone (FLONASE) 50 MCG/ACT nasal spray 16 g 1    Sig: Place 2 sprays into both nostrils daily.

## 2021-02-16 ENCOUNTER — Other Ambulatory Visit: Payer: Self-pay | Admitting: Family Medicine

## 2021-03-16 ENCOUNTER — Other Ambulatory Visit: Payer: Self-pay | Admitting: Family Medicine

## 2021-03-18 ENCOUNTER — Other Ambulatory Visit: Payer: Self-pay | Admitting: Family Medicine

## 2021-04-12 ENCOUNTER — Other Ambulatory Visit: Payer: Self-pay | Admitting: Family Medicine

## 2021-05-02 LAB — HM DIABETES EYE EXAM

## 2021-05-05 ENCOUNTER — Encounter: Payer: Self-pay | Admitting: Family Medicine

## 2021-05-08 ENCOUNTER — Encounter: Payer: Managed Care, Other (non HMO) | Admitting: Family Medicine

## 2021-05-12 ENCOUNTER — Encounter: Payer: Self-pay | Admitting: Family Medicine

## 2021-05-12 ENCOUNTER — Ambulatory Visit (INDEPENDENT_AMBULATORY_CARE_PROVIDER_SITE_OTHER): Payer: Managed Care, Other (non HMO) | Admitting: Family Medicine

## 2021-05-12 VITALS — BP 114/72 | HR 55 | Temp 97.8°F | Resp 16 | Ht 67.0 in | Wt 198.8 lb

## 2021-05-12 DIAGNOSIS — Z23 Encounter for immunization: Secondary | ICD-10-CM

## 2021-05-12 DIAGNOSIS — F418 Other specified anxiety disorders: Secondary | ICD-10-CM | POA: Diagnosis not present

## 2021-05-12 DIAGNOSIS — E103292 Type 1 diabetes mellitus with mild nonproliferative diabetic retinopathy without macular edema, left eye: Secondary | ICD-10-CM | POA: Diagnosis not present

## 2021-05-12 DIAGNOSIS — Z125 Encounter for screening for malignant neoplasm of prostate: Secondary | ICD-10-CM

## 2021-05-12 DIAGNOSIS — E782 Mixed hyperlipidemia: Secondary | ICD-10-CM

## 2021-05-12 DIAGNOSIS — I10 Essential (primary) hypertension: Secondary | ICD-10-CM | POA: Diagnosis not present

## 2021-05-12 DIAGNOSIS — Z0000 Encounter for general adult medical examination without abnormal findings: Secondary | ICD-10-CM

## 2021-05-12 LAB — CBC WITH DIFFERENTIAL/PLATELET
Basophils Absolute: 0 10*3/uL (ref 0.0–0.1)
Basophils Relative: 0.5 % (ref 0.0–3.0)
Eosinophils Absolute: 0.1 10*3/uL (ref 0.0–0.7)
Eosinophils Relative: 1.5 % (ref 0.0–5.0)
HCT: 42.3 % (ref 39.0–52.0)
Hemoglobin: 13.5 g/dL (ref 13.0–17.0)
Lymphocytes Relative: 24 % (ref 12.0–46.0)
Lymphs Abs: 2 10*3/uL (ref 0.7–4.0)
MCHC: 32 g/dL (ref 30.0–36.0)
MCV: 89.5 fl (ref 78.0–100.0)
Monocytes Absolute: 0.7 10*3/uL (ref 0.1–1.0)
Monocytes Relative: 8.4 % (ref 3.0–12.0)
Neutro Abs: 5.5 10*3/uL (ref 1.4–7.7)
Neutrophils Relative %: 65.6 % (ref 43.0–77.0)
Platelets: 187 10*3/uL (ref 150.0–400.0)
RBC: 4.72 Mil/uL (ref 4.22–5.81)
RDW: 13.1 % (ref 11.5–15.5)
WBC: 8.4 10*3/uL (ref 4.0–10.5)

## 2021-05-12 LAB — COMPREHENSIVE METABOLIC PANEL
ALT: 23 U/L (ref 0–53)
AST: 23 U/L (ref 0–37)
Albumin: 4.4 g/dL (ref 3.5–5.2)
Alkaline Phosphatase: 82 U/L (ref 39–117)
BUN: 24 mg/dL — ABNORMAL HIGH (ref 6–23)
CO2: 33 mEq/L — ABNORMAL HIGH (ref 19–32)
Calcium: 9.7 mg/dL (ref 8.4–10.5)
Chloride: 103 mEq/L (ref 96–112)
Creatinine, Ser: 0.91 mg/dL (ref 0.40–1.50)
GFR: 91.38 mL/min (ref >60.00)
Glucose, Bld: 67 mg/dL — ABNORMAL LOW (ref 70–99)
Potassium: 5.1 mEq/L (ref 3.5–5.1)
Sodium: 139 mEq/L (ref 135–145)
Total Bilirubin: 0.5 mg/dL (ref 0.2–1.2)
Total Protein: 6.9 g/dL (ref 6.0–8.3)

## 2021-05-12 LAB — TSH: TSH: 2.64 u[IU]/mL (ref 0.35–5.50)

## 2021-05-12 LAB — MICROALBUMIN / CREATININE URINE RATIO
Creatinine,U: 114.3 mg/dL
Microalb Creat Ratio: 1.1 mg/g (ref 0.0–30.0)
Microalb, Ur: 1.2 mg/dL (ref 0.0–1.9)

## 2021-05-12 LAB — LIPID PANEL
Cholesterol: 163 mg/dL (ref 0–200)
HDL: 54.5 mg/dL (ref >39.00)
LDL Cholesterol: 92 mg/dL (ref 0–99)
NonHDL: 108.49
Total CHOL/HDL Ratio: 3
Triglycerides: 82 mg/dL (ref 0.0–149.0)
VLDL: 16.4 mg/dL (ref 0.0–40.0)

## 2021-05-12 LAB — PSA: PSA: 0.4 ng/mL (ref 0.10–4.00)

## 2021-05-12 LAB — HEMOGLOBIN A1C: Hgb A1c MFr Bld: 6.7 % — ABNORMAL HIGH (ref 4.6–6.5)

## 2021-05-12 NOTE — Assessment & Plan Note (Addendum)
Patient encouraged to maintain heart healthy diet, regular exercise, adequate sleep. Consider daily probiotics. Take medications as prescribed. Labs ordered and reviewed. Shingrix is given today. Due for repeat colonoscopy in coming year.

## 2021-05-12 NOTE — Assessment & Plan Note (Signed)
Doing well on current meds, no changes 

## 2021-05-12 NOTE — Assessment & Plan Note (Signed)
hgba1c acceptable, minimize simple carbs. Increase exercise as tolerated. Continue current meds 

## 2021-05-12 NOTE — Assessment & Plan Note (Signed)
Tolerating statin, encouraged heart healthy diet, avoid trans fats, minimize simple carbs and saturated fats. Increase exercise as tolerated 

## 2021-05-12 NOTE — Progress Notes (Signed)
Subjective:   By signing my name below, I, Scott Adkins, attest that this documentation has been prepared under the direction and in the presence of Scott Canary, MD. 05/12/2021       Patient ID: Scott Adkins, male    DOB: Jun 01, 1960, 61 y.o.   MRN: [id number]  Chief Complaint  Patient presents with   Annual Exam    HPI Patient is in today for a comprehensive physical exam. No recent illnesses or ED visits.  He reports he is doing well at this time.  He checks his blood sugar levels at home. In January, he had readings in the 400's. Normal readings are about 140's.  UTD on dental care.  He denies fever, congestion, eye pain, chest pain, palpitations, leg swelling, shortness of breath, nausea, abdominal pain, diarrhea and blood in stool. Also denies dysuria, frequency, back pain and headaches.   He sleeps about 5-6 hours per day and is normal for him.  He started [employer] and is doing well on it.   He will get the 1st dose of the shingles vaccine today. He has received the flu vaccine. He has 3 Covid-19 vaccines at this time.   No changes in family medical history.  Past Medical History:  Diagnosis Date   Anemia 12/22/2015   Arthritis    Chicken pox as a child   Constipation 12/22/2016   Diabetes mellitus type I (HCC)    uses insulin pump// managed by Dr. 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   Hyperlipidemia    Hypertension    Injury of right rotator cuff 08/16/2012   Has been seen by [facility]   Left flank pain 09/12/2015   Mumps as a child   Pain in joint, lower leg 01/24/2016   Preventative health care 02/19/2013   Sun-damaged skin 12/06/2015    Past Surgical History:  Procedure Laterality Date   insulin pump     placed and removed   REFRACTIVE SURGERY Right 2007   [facility]   ROTATOR CUFF REPAIR  2004   left    Family History  Problem Relation Age of Onset   Hypertension Mother     Stroke Father    Hypertension Father    Hyperlipidemia Father    Hypertension Brother    Hyperlipidemia Brother    Mental illness Brother        depression   Heart disease Maternal Grandmother        MI   Heart disease Maternal Grandfather        MI   Hyperlipidemia Brother    Hypertension Brother    Colon cancer Neg Hx    Prostate cancer Neg Hx    Breast cancer Neg Hx    Rectal cancer Neg Hx    Diabetes Neg Hx     Social History   Socioeconomic History   Marital status: Married    Spouse name: Not on file   Number of children: 3   Years of education: Not on file   Highest education level: Not on file  Occupational History   Occupation: welder  Tobacco Use   Smoking status: Never   Smokeless tobacco: Never  Substance and Sexual Activity   Alcohol use: No    Alcohol/week: 1.0 standard drink    Types: 1 Shots of liquor per week   Drug use: No   Sexual activity: Not on file    Comment: lives  with wife and mother in law, no dietary restrictions  Other Topics Concern   Not on file  Social History Narrative   Married 14 yrs   2 daughters; 1 son   Occupation: Psychologist, occupationalwelder      Social Determinants of Corporate investment bankerHealth   Financial Resource Strain: Not on file  Food Insecurity: Not on file  Transportation Needs: Not on file  Physical Activity: Not on file  Stress: Not on file  Social Connections: Not on file  Intimate Partner Violence: Not on file    Outpatient Medications Prior to Visit  Medication Sig Dispense Refill   aspirin 81 MG tablet Take 81 mg by mouth daily.     atorvastatin (LIPITOR) 20 MG tablet Take 1 tablet by mouth once daily 90 tablet 0   Cholecalciferol (VITAMIN D3) 1000 UNITS CAPS Take 2,000 Units by mouth daily.     fluticasone (FLONASE) 50 MCG/ACT nasal spray Place 2 sprays into both nostrils daily. 16 g 1   insulin detemir (LEVEMIR) 100 UNIT/ML injection INJECT 10 UNITS SUBCUTANEOUSLY AT BEDTIME 10 mL 0   metoprolol succinate (TOPROL-XL) 100 MG 24 hr tablet  TAKE 1 TABLET BY MOUTH ONCE DAILY WITH  OR  IMMEDIATELY  FOLLOWING  A  MEAL 90 tablet 0   NOVOLOG 100 UNIT/ML injection INJECT 7 TO 20 UNITS SUBCUTANEOUSLY THREE TIMES DAILY WITH MEALS 40 mL 0   sertraline (ZOLOFT) 50 MG tablet Take 1 tablet by mouth once daily 90 tablet 0   No facility-administered medications prior to visit.    Allergies  Allergen Reactions   Humalog [Insulin Lispro] Other (See Comments)    Sugar drops dangerously low    Lisinopril     Hyperkalemia    Viagra [Sildenafil Citrate] Other (See Comments)    headache    Review of Systems  Constitutional:  Negative for fever.  HENT:  Negative for congestion and sore throat.   Eyes:  Negative for pain.  Respiratory:  Negative for cough and shortness of breath.   Cardiovascular:  Negative for chest pain, palpitations and leg swelling.  Gastrointestinal:  Negative for abdominal pain, blood in stool, diarrhea and nausea.  Genitourinary:  Negative for dysuria and frequency.  Musculoskeletal:  Negative for back pain.  Skin:  Negative for itching.  Neurological:  Negative for headaches.  Psychiatric/Behavioral:  The patient is not nervous/anxious.       Objective:    Physical Exam Constitutional:      Appearance: Normal appearance. He is not ill-appearing.  HENT:     Head: Normocephalic and atraumatic.     Right Ear: Tympanic membrane, ear canal and external ear normal.     Left Ear: Tympanic membrane, ear canal and external ear normal.  Eyes:     Conjunctiva/sclera: Conjunctivae normal.  Cardiovascular:     Rate and Rhythm: Normal rate and regular rhythm.     Heart sounds: Normal heart sounds. No murmur heard. Pulmonary:     Breath sounds: Normal breath sounds. No wheezing.  Abdominal:     General: Bowel sounds are normal. There is no distension.     Palpations: Abdomen is soft.     Tenderness: There is no abdominal tenderness.     Hernia: No hernia is present.  Musculoskeletal:     Cervical back: Neck  supple.     Comments: 5/5 strength in upper and lower extremities   Lymphadenopathy:     Cervical: No cervical adenopathy.  Skin:    General: Skin is warm and  dry.  Neurological:     Mental Status: He is alert and oriented to person, place, and time.     Deep Tendon Reflexes:     Reflex Scores:      Patellar reflexes are 2+ on the right side and 2+ on the left side. Psychiatric:        Behavior: Behavior normal.    BP 114/72    Pulse (!) 55    Temp 97.8 F (36.6 C)    Resp 16    Ht 5\' 7"  (1.702 m)    Wt 198 lb 12.8 oz (90.2 kg)    SpO2 99%    BMI 31.14 kg/m  Wt Readings from Last 3 Encounters:  05/12/21 198 lb 12.8 oz (90.2 kg)  01/28/21 198 lb 9.6 oz (90.1 kg)  01/14/21 205 lb 3.2 oz (93.1 kg)    Diabetic Foot Exam - Simple   No data filed    Lab Results  Component Value Date   WBC 8.4 05/12/2021   HGB 13.5 05/12/2021   HCT 42.3 05/12/2021   PLT 187.0 05/12/2021   GLUCOSE 67 (L) 05/12/2021   CHOL 163 05/12/2021   TRIG 82.0 05/12/2021   HDL 54.50 05/12/2021   LDLDIRECT 71 09/10/2008   LDLCALC 92 05/12/2021   ALT 23 05/12/2021   AST 23 05/12/2021   NA 139 05/12/2021   K 5.1 05/12/2021   CL 103 05/12/2021   CREATININE 0.91 05/12/2021   BUN 24 (H) 05/12/2021   CO2 33 (H) 05/12/2021   TSH 2.64 05/12/2021   PSA 0.40 05/12/2021   HGBA1C 6.7 (H) 05/12/2021   MICROALBUR 1.2 05/12/2021    Lab Results  Component Value Date   TSH 2.64 05/12/2021   Lab Results  Component Value Date   WBC 8.4 05/12/2021   HGB 13.5 05/12/2021   HCT 42.3 05/12/2021   MCV 89.5 05/12/2021   PLT 187.0 05/12/2021   Lab Results  Component Value Date   NA 139 05/12/2021   K 5.1 05/12/2021   CO2 33 (H) 05/12/2021   GLUCOSE 67 (L) 05/12/2021   BUN 24 (H) 05/12/2021   CREATININE 0.91 05/12/2021   BILITOT 0.5 05/12/2021   ALKPHOS 82 05/12/2021   AST 23 05/12/2021   ALT 23 05/12/2021   PROT 6.9 05/12/2021   ALBUMIN 4.4 05/12/2021   CALCIUM 9.7 05/12/2021   GFR 91.38 05/12/2021    Lab Results  Component Value Date   CHOL 163 05/12/2021   Lab Results  Component Value Date   HDL 54.50 05/12/2021   Lab Results  Component Value Date   LDLCALC 92 05/12/2021   Lab Results  Component Value Date   TRIG 82.0 05/12/2021   Lab Results  Component Value Date   CHOLHDL 3 05/12/2021   Lab Results  Component Value Date   HGBA1C 6.7 (H) 05/12/2021        Colonoscopy- Last completed on 12/25/2011. Mild melanosis was found. There were also diminutive polyps. Repeat in 10 years.  PSA- Last checked on 02/24/2020. Reading was 0.40  Assessment & Plan:   Problem List Items Addressed This Visit     Hyperlipidemia    Tolerating statin, encouraged heart healthy diet, avoid trans fats, minimize simple carbs and saturated fats. Increase exercise as tolerated      Relevant Orders   CBC with Differential/Platelet (Completed)   Comprehensive metabolic panel (Completed)   Lipid panel (Completed)   TSH (Completed)   Depression with anxiety    Doing  well on current meds, no changes      Essential hypertension    Well controlled, no changes to meds. Encouraged heart healthy diet such as the DASH diet and exercise as tolerated.       Relevant Orders   CBC with Differential/Platelet (Completed)   Comprehensive metabolic panel (Completed)   Lipid panel (Completed)   TSH (Completed)   Preventative health care    Patient encouraged to maintain heart healthy diet, regular exercise, adequate sleep. Consider daily probiotics. Take medications as prescribed. Labs ordered and reviewed. Shingrix is given today. Due for repeat colonoscopy in coming year.       DM (diabetes mellitus), type 1 with ophthalmic complications (HCC) - Primary    hgba1c acceptable, minimize simple carbs. Increase exercise as tolerated. Continue current meds      Relevant Orders   Hemoglobin A1c (Completed)   Microalbumin / creatinine urine ratio (Completed)   Other Visit Diagnoses      Prostate cancer screening       Relevant Orders   PSA (Completed)   Need for shingles vaccine       Relevant Orders   Varicella-zoster vaccine IM (Shingrix) (Completed)       No orders of the defined types were placed in this encounter.   I,Scott Adkins,acting as a Neurosurgeon for Danise Edge, MD.,have documented all relevant documentation on the behalf of Danise Edge, MD,as directed by  Danise Edge, MD while in the presence of Danise Edge, MD.   I, Scott Canary, MD., personally preformed the services described in this documentation.  All medical record entries made by the scribe were at my direction and in my presence.  I have reviewed the chart and discharge instructions (if applicable) and agree that the record reflects my personal performance and is accurate and complete. 05/12/2021

## 2021-05-12 NOTE — Assessment & Plan Note (Signed)
Well controlled, no changes to meds. Encouraged heart healthy diet such as the DASH diet and exercise as tolerated.  °

## 2021-05-12 NOTE — Patient Instructions (Addendum)
6-8 hours sleep 60-80 ounces fluids daily 7 to 10000 steps a day with 1/2 moderate intensity MIND or DASH diet Preventive Care 36-61 Years Old, Male Preventive care refers to lifestyle choices and visits with your health care provider that can promote health and wellness. Preventive care visits are also called wellness exams. What can I expect for my preventive care visit? Counseling During your preventive care visit, your health care provider may ask about your: Medical history, including: Past medical problems. Family medical history. Current health, including: Emotional well-being. Home life and relationship well-being. Sexual activity. Lifestyle, including: Alcohol, nicotine or tobacco, and drug use. Access to firearms. Diet, exercise, and sleep habits. Safety issues such as seatbelt and bike helmet use. Sunscreen use. Work and work Astronomer. Physical exam Your health care provider will check your: Height and weight. These may be used to calculate your BMI (body mass index). BMI is a measurement that tells if you are at a healthy weight. Waist circumference. This measures the distance around your waistline. This measurement also tells if you are at a healthy weight and may help predict your risk of certain diseases, such as type 2 diabetes and high blood pressure. Heart rate and blood pressure. Body temperature. Skin for abnormal spots. What immunizations do I need? Vaccines are usually given at various ages, according to a schedule. Your health care provider will recommend vaccines for you based on your age, medical history, and lifestyle or other factors, such as travel or where you work. What tests do I need? Screening Your health care provider may recommend screening tests for certain conditions. This may include: Lipid and cholesterol levels. Diabetes screening. This is done by checking your blood sugar (glucose) after you have not eaten for a while  (fasting). Hepatitis B test. Hepatitis C test. HIV (human immunodeficiency virus) test. STI (sexually transmitted infection) testing, if you are at risk. Lung cancer screening. Prostate cancer screening. Colorectal cancer screening. Talk with your health care provider about your test results, treatment options, and if necessary, the need for more tests. Follow these instructions at home: Eating and drinking  Eat a diet that includes fresh fruits and vegetables, whole grains, lean protein, and low-fat dairy products. Take vitamin and mineral supplements as recommended by your health care provider. Do not drink alcohol if your health care provider tells you not to drink. If you drink alcohol: Limit how much you have to 0-2 drinks a day. Know how much alcohol is in your drink. In the [place] one drink equals one 12 oz bottle of beer (355 mL), one 5 oz glass of wine (148 mL), or one 1 oz glass of hard liquor (44 mL). Lifestyle Brush your teeth every morning and night with fluoride toothpaste. Floss one time each day. Exercise for at least 30 minutes 5 or more days each week. Do not use any products that contain nicotine or tobacco. These products include cigarettes, chewing tobacco, and vaping devices, such as e-cigarettes. If you need help quitting, ask your health care provider. Do not use drugs. If you are sexually active, practice safe sex. Use a condom or other form of protection to prevent STIs. Take aspirin only as told by your health care provider. Make sure that you understand how much to take and what form to take. Work with your health care provider to find out whether it is safe and beneficial for you to take aspirin daily. Find healthy ways to manage stress, such as: Meditation, yoga, or listening to  music. Journaling. Talking to a trusted person. Spending time with friends and family. Minimize exposure to UV radiation to reduce your risk of skin cancer. Safety Always wear  your seat belt while driving or riding in a vehicle. Do not drive: If you have been drinking alcohol. Do not ride with someone who has been drinking. When you are tired or distracted. While texting. If you have been using any mind-altering substances or drugs. Wear a helmet and other protective equipment during sports activities. If you have firearms in your house, make sure you follow all gun safety procedures. What's next? Go to your health care provider once a year for an annual wellness visit. Ask your health care provider how often you should have your eyes and teeth checked. Stay up to date on all vaccines. This information is not intended to replace advice given to you by your health care provider. Make sure you discuss any questions you have with your health care provider. Document Revised: 09/18/2020 Document Reviewed: 09/18/2020 Elsevier Patient Education  2022 [employer].

## 2021-05-17 ENCOUNTER — Other Ambulatory Visit: Payer: Self-pay | Admitting: Family Medicine

## 2021-05-26 ENCOUNTER — Other Ambulatory Visit: Payer: Self-pay | Admitting: Family Medicine

## 2021-07-08 ENCOUNTER — Other Ambulatory Visit: Payer: Self-pay | Admitting: Family Medicine

## 2021-07-14 ENCOUNTER — Telehealth: Payer: Self-pay | Admitting: Family Medicine

## 2021-07-14 MED ORDER — INSULIN DETEMIR 100 UNIT/ML ~~LOC~~ SOLN: SUBCUTANEOUS | 1 refills | Status: DC

## 2021-07-14 NOTE — Telephone Encounter (Signed)
Pt states pharmacy was rude to him and he would like his medications cancelled and sent to another pharmacy.  ? ?Medication: insulin detemir (LEVEMIR) 100 UNIT/ML injection ? ?Has the patient contacted their pharmacy? Yes.   ? ? ?Pref[facility]acy: [facility] ?[address] [place], [place] [address] ?[phone] ? ?

## 2021-07-14 NOTE — Telephone Encounter (Signed)
Refill sent.

## 2021-09-02 ENCOUNTER — Telehealth: Payer: Self-pay | Admitting: Family Medicine

## 2021-09-02 ENCOUNTER — Other Ambulatory Visit: Payer: Self-pay

## 2021-09-02 MED ORDER — INSULIN DETEMIR 100 UNIT/ML ~~LOC~~ SOLN: SUBCUTANEOUS | 1 refills | Status: DC

## 2021-09-02 NOTE — Telephone Encounter (Signed)
Spoke with pt. Levemir sent in as generic rx.

## 2021-09-02 NOTE — Telephone Encounter (Signed)
Pt called stating that he would like his nighttime insulin changed to a generic form due to cost for the pt.

## 2021-09-23 ENCOUNTER — Other Ambulatory Visit: Payer: Self-pay | Admitting: Family Medicine

## 2021-09-24 ENCOUNTER — Telehealth: Payer: Self-pay | Admitting: Family Medicine

## 2021-09-24 MED ORDER — INSULIN DETEMIR 100 UNIT/ML ~~LOC~~ SOLN: SUBCUTANEOUS | 1 refills | Status: DC

## 2021-09-24 NOTE — Telephone Encounter (Signed)
Patient states he is having trouble getting refills for his insulin detemir (LEVEMIR) 100 UNIT/ML injection medication. He states he needs to change from 10 units to 12 in order to get it refilled. Please advise.

## 2021-09-24 NOTE — Telephone Encounter (Signed)
Spoke with pt. Pt states he is taking 12 units instead of 10. Refill resent with adjusted signature.

## 2021-10-10 ENCOUNTER — Ambulatory Visit: Payer: Managed Care, Other (non HMO) | Admitting: Family

## 2021-10-10 ENCOUNTER — Encounter: Payer: Self-pay | Admitting: Family

## 2021-10-10 VITALS — BP 151/52 | HR 46 | Temp 97.9°F | Resp 16 | Ht 67.0 in | Wt 201.0 lb

## 2021-10-10 DIAGNOSIS — L237 Allergic contact dermatitis due to plants, except food: Secondary | ICD-10-CM | POA: Insufficient documentation

## 2021-10-10 DIAGNOSIS — I10 Essential (primary) hypertension: Secondary | ICD-10-CM | POA: Diagnosis not present

## 2021-10-10 MED ORDER — HYDROCHLOROTHIAZIDE 25 MG PO TABS: 25.0000 mg | ORAL_TABLET | Freq: Every day | ORAL | 3 refills | Status: DC

## 2021-10-10 MED ORDER — METHYLPREDNISOLONE SODIUM SUCC 125 MG IJ SOLR
125.0000 mg | Freq: Once | INTRAMUSCULAR | Status: AC
  Administered 2021-10-10: 125 mg via INTRAMUSCULAR

## 2021-10-10 MED ORDER — PREDNISONE 10 MG PO TABS: ORAL_TABLET | ORAL | 0 refills | Status: DC

## 2021-10-10 MED ORDER — METOPROLOL SUCCINATE ER 50 MG PO TB24: 50.0000 mg | ORAL_TABLET | Freq: Every day | ORAL | 1 refills | Status: DC

## 2021-10-10 NOTE — Assessment & Plan Note (Addendum)
BP Readings from Last 3 Encounters:  10/10/21 (!) 159/59  05/12/21 114/72  01/28/21 136/70   BP elevated, HR in 40's.  Review of chart shows that he had hyperkalemia on lisinopril. his K+ runs high normal. Had dizziness in the past on amlodipine. Pt is advised to decrease toprol xl from 100mg  once daily to 50mg  once daily and add HCTZ 25mg  once daily. Follow up in 1 week.

## 2021-10-10 NOTE — Addendum Note (Signed)
Addended by: Wilford Corner on: 10/10/2021 10:10 AM   Modules accepted: Orders

## 2021-10-10 NOTE — Assessment & Plan Note (Signed)
New. Pt given solumedrol 125mg  IM x 1. This will be followed by a prednisone taper and prn benadryl.

## 2021-10-10 NOTE — Patient Instructions (Signed)
Please decrease metoprolol from 100mg  once daily to 50mg  once daily. Add HCTZ 25mg  once daily in the AM. Be sure to stay well hydrated on this medication. Start prednisone taper tomorrow AM. may use benadryl as needed for itching.

## 2021-10-10 NOTE — Progress Notes (Signed)
Subjective:   By signing my name below, I, Scott Adkins, attest that this documentation has been prepared under the direction and in the presence of  Scott Craze, NP  10/10/2021.    Patient ID: Scott Adkins, male    DOB: [birth year], 61 y.o.   MRN: [id number]  Chief Complaint  Patient presents with   Poison Ivy    Complains of poison ivy     HPI Patient is in today for an office visit. He complains of itching and poison ivy rash. He did yard work on 7/4 and developed rash and itching on 10/08/21. He has taken Calamine lotion to manage his poison ivy rash but has seen no improvement.  Blood pressure- His blood pressure is slightly elevated today. He takes metoprolol succinate 100 mg PO every night. BP Readings from Last 3 Encounters:  10/10/21 (!) 151/52  05/12/21 114/72  01/28/21 136/70      Past Medical History:  Diagnosis Date   Anemia 12/22/2015   Arthritis    Chicken pox as a child   Constipation 12/22/2016   Diabetes mellitus type I (HCC)    uses insulin pump// managed by Dr. 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   Hyperlipidemia    Hypertension    Injury of right rotator cuff 08/16/2012   Has been seen by [facility]   Left flank pain 09/12/2015   Mumps as a child   Pain in joint, lower leg 01/24/2016   Preventative health care 02/19/2013   Sun-damaged skin 12/06/2015    Past Surgical History:  Procedure Laterality Date   insulin pump     placed and removed   REFRACTIVE SURGERY Right 2007   [facility]   ROTATOR CUFF REPAIR  2004   left    Family History  Problem Relation Age of Onset   Hypertension Mother    Stroke Father    Hypertension Father    Hyperlipidemia Father    Hypertension Brother    Hyperlipidemia Brother    Mental illness Brother        depression   Heart disease Maternal Grandmother        MI   Heart disease Maternal Grandfather        MI   Hyperlipidemia Brother     Hypertension Brother    Colon cancer Neg Hx    Prostate cancer Neg Hx    Breast cancer Neg Hx    Rectal cancer Neg Hx    Diabetes Neg Hx     Social History   Socioeconomic History   Marital status: Married    Spouse name: Not on file   Number of children: 3   Years of education: Not on file   Highest education level: Not on file  Occupational History   Occupation: welder  Tobacco Use   Smoking status: Never   Smokeless tobacco: Never  Substance and Sexual Activity   Alcohol use: No    Alcohol/week: 1.0 standard drink of alcohol    Types: 1 Shots of liquor per week   Drug use: No   Sexual activity: Not on file    Comment: lives with wife and mother in law, no dietary restrictions  Other Topics Concern   Not on file  Social History Narrative   Married 14 yrs   2 daughters; 1 son   Occupation: Psychologist, occupational      Social Determinants of Corporate investment banker Strain:  Not on file  Food Insecurity: Not on file  Transportation Needs: Not on file  Physical Activity: Not on file  Stress: Not on file  Social Connections: Not on file  Intimate Partner Violence: Not on file    Outpatient Medications Prior to Visit  Medication Sig Dispense Refill   aspirin 81 MG tablet Take 81 mg by mouth daily.     atorvastatin (LIPITOR) 20 MG tablet Take 1 tablet by mouth once daily 90 tablet 1   Cholecalciferol (VITAMIN D3) 1000 UNITS CAPS Take 2,000 Units by mouth daily.     fluticasone (FLONASE) 50 MCG/ACT nasal spray Place 2 sprays into both nostrils daily. 16 g 1   insulin detemir (LEVEMIR) 100 UNIT/ML injection INJECT 12 UNITS SUBCUTANEOUSLY AT BEDTIME 10 mL 1   NOVOLOG 100 UNIT/ML injection INJECT 7 TO 20 UNITS SUBCUTANEOUSLY THREE TIMES DAILY WITH MEALS 40 mL 0   sertraline (ZOLOFT) 50 MG tablet Take 1 tablet by mouth once daily 90 tablet 0   metoprolol succinate (TOPROL-XL) 100 MG 24 hr tablet TAKE 1 TABLET BY MOUTH ONCE DAILY WITH OR IMMEDIATELY FOLLOWING A MEAL 90 tablet 1   No  facility-administered medications prior to visit.    Allergies  Allergen Reactions   Humalog [Insulin Lispro] Other (See Comments)    Sugar drops dangerously low    Lisinopril     Hyperkalemia    Viagra [Sildenafil Citrate] Other (See Comments)    headache    Review of Systems  Skin:  Positive for itching (on rash sites) and rash (bilateral forearms).       Objective:    Physical Exam Constitutional:      Appearance: Normal appearance. He is not ill-appearing.  HENT:     Head: Normocephalic and atraumatic.     Right Ear: External ear normal.     Left Ear: External ear normal.  Eyes:     Extraocular Movements: Extraocular movements intact.     Pupils: Pupils are equal, round, and reactive to light.  Pulmonary:     Effort: Pulmonary effort is normal.  Skin:    General: Skin is warm and dry.     Comments: Rash on bilateral forearms   Neurological:     Mental Status: He is alert and oriented to person, place, and time.  Psychiatric:        Judgment: Judgment normal.      BP (!) 151/52   Pulse (!) 46   Temp 97.9 F (36.6 C) (Oral)   Resp 16   Ht 5\' 7"  (1.702 m)   Wt 201 lb (91.2 kg)   SpO2 99%   BMI 31.48 kg/m  Wt Readings from Last 3 Encounters:  10/10/21 201 lb (91.2 kg)  05/12/21 198 lb 12.8 oz (90.2 kg)  01/28/21 198 lb 9.6 oz (90.1 kg)         Assessment & Plan:   Problem List Items Addressed This Visit       Unprioritized   Poison ivy dermatitis - Primary    New. Pt given solumedrol 125mg  IM x 1. This will be followed by a prednisone taper and prn benadryl.       Essential hypertension    BP Readings from Last 3 Encounters:  10/10/21 (!) 159/59  05/12/21 114/72  01/28/21 136/70  BP elevated, HR in 40's.  Review of chart shows that he had hyperkalemia on lisinopril. his K+ runs high normal. Had dizziness in the past on amlodipine. Pt is advised to  decrease toprol xl from 100mg  once daily to 50mg  once daily and add HCTZ 25mg  once daily.  Follow up in 1 week.       Relevant Medications   hydrochlorothiazide (HYDRODIURIL) 25 MG tablet   metoprolol succinate (TOPROL-XL) 50 MG 24 hr tablet    Meds ordered this encounter  Medications   predniSONE (DELTASONE) 10 MG tablet    Sig: 4 tabs by mouth once daily for 2 days, then 3 tabs daily x 2 days, then 2 tabs daily x 2 days, then 1 tab daily x 2 days    Dispense:  20 tablet    Refill:  0    Order Specific Question:   Supervising Provider    Answer:   Penni Homans A [[id number]]   hydrochlorothiazide (HYDRODIURIL) 25 MG tablet    Sig: Take 1 tablet (25 mg total) by mouth daily.    Dispense:  30 tablet    Refill:  3    Order Specific Question:   Supervising Provider    Answer:   Penni Homans A [[id number]]   metoprolol succinate (TOPROL-XL) 50 MG 24 hr tablet    Sig: Take 1 tablet (50 mg total) by mouth daily. Take with or immediately following a meal.    Dispense:  30 tablet    Refill:  1    Order Specific Question:   Supervising Provider    Answer:   Penni Homans A [[id number]]    I, Nance Pear, NP, personally preformed the services described in this documentation.  All medical record entries made by the scribe were at my direction and in my presence.  I have reviewed the chart and discharge instructions (if applicable) and agree that the record reflects my personal performance and is accurate and complete. 10/10/2021   I,Mohammed Iqbal,acting as a scribe for Nance Pear, NP.,have documented all relevant documentation on the behalf of Nance Pear, NP,as directed by  Nance Pear, NP while in the presence of Nance Pear, NP.   Nance Pear, NP

## 2021-10-14 ENCOUNTER — Other Ambulatory Visit: Payer: Self-pay | Admitting: Family Medicine

## 2021-10-17 ENCOUNTER — Ambulatory Visit: Payer: Managed Care, Other (non HMO) | Admitting: Family

## 2021-10-17 DIAGNOSIS — L237 Allergic contact dermatitis due to plants, except food: Secondary | ICD-10-CM

## 2021-10-17 DIAGNOSIS — I10 Essential (primary) hypertension: Secondary | ICD-10-CM

## 2021-10-17 LAB — BASIC METABOLIC PANEL
BUN: 29 mg/dL — ABNORMAL HIGH (ref 6–23)
CO2: 27 mEq/L (ref 19–32)
Calcium: 8.9 mg/dL (ref 8.4–10.5)
Chloride: 102 mEq/L (ref 96–112)
Creatinine, Ser: 1.02 mg/dL (ref 0.40–1.50)
GFR: 79.44 mL/min (ref >60.00)
Glucose, Bld: 280 mg/dL — ABNORMAL HIGH (ref 70–99)
Potassium: 4.4 mEq/L (ref 3.5–5.1)
Sodium: 137 mEq/L (ref 135–145)

## 2021-10-17 MED ORDER — CARVEDILOL 12.5 MG PO TABS: 12.5000 mg | ORAL_TABLET | Freq: Two times a day (BID) | ORAL | 3 refills | Status: DC

## 2021-10-17 NOTE — Progress Notes (Addendum)
Subjective:   By signing my name below, I, [name], attest that this documentation has been prepared under the direction and in the presence of [name]' Su[place]ivan, NP 10/17/2021   Patient ID: Scott Adkins, male    DOB: 10/11/1960, 61 y.o.   MRN: [id number]  Chief Complaint  Patient presents with   Hypertension    Here for follow up    HPI Patient is in today for an office visit  Poison Ivy - His poison ivy symptoms are improving. He is no longer experiencing itchiness.   Blood Pressure -  He started taking 25 Mg of Hydrochlorothiazide and decreased to 50 Mg of Metoprolol Succinate after last visit.  He has taken Lisinopril in the past which caused hyperkalemia. He states that he has a blood pressure cuff at home but does not regularly check his blood pressure.   BP Readings from Last 3 Encounters:  10/17/21 (!) 168/69  10/10/21 (!) 151/52  05/12/21 114/72   Pulse Readings from Last 3 Encounters:  10/17/21 (!) 51  10/10/21 (!) 46  05/12/21 (!) 55    Health Maintenance Due  Topic Date Due   Zoster Vaccines- Shingrix (2 of 2) 07/07/2021    Past Medical History:  Diagnosis Date   Anemia 12/22/2015   Arthritis    Chicken pox as a child   Constipation 12/22/2016   Diabetes mellitus type I (HCC)    uses insulin pump// managed by Dr. 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   Hyperlipidemia    Hypertension    Injury of right rotator cuff 08/16/2012   Has been seen by [facility]   Left flank pain 09/12/2015   Mumps as a child   Pain in joint, lower leg 01/24/2016   Preventative health care 02/19/2013   Sun-damaged skin 12/06/2015    Past Surgical History:  Procedure Laterality Date   insulin pump     placed and removed   REFRACTIVE SURGERY Right 2007   [facility]   ROTATOR CUFF REPAIR  2004   left    Family History  Problem Relation Age of Onset   Hypertension Mother    Stroke Father     Hypertension Father    Hyperlipidemia Father    Hypertension Brother    Hyperlipidemia Brother    Mental illness Brother        depression   Heart disease Maternal Grandmother        MI   Heart disease Maternal Grandfather        MI   Hyperlipidemia Brother    Hypertension Brother    Colon cancer Neg Hx    Prostate cancer Neg Hx    Breast cancer Neg Hx    Rectal cancer Neg Hx    Diabetes Neg Hx     Social History   Socioeconomic History   Marital status: Married    Spouse name: Not on file   Number of children: 3   Years of education: Not on file   Highest education level: Not on file  Occupational History   Occupation: welder  Tobacco Use   Smoking status: Never   Smokeless tobacco: Never  Substance and Sexual Activity   Alcohol use: No    Alcohol/week: 1.0 standard drink of alcohol    Types: 1 Shots of liquor per week   Drug use: No   Sexual activity: Not on file    Comment: lives with  wife and mother in law, no dietary restrictions  Other Topics Concern   Not on file  Social History Narrative   Married 14 yrs   2 daughters; 1 son   Occupation: Psychologist, occupational      Social Determinants of Corporate investment banker Strain: Not on file  Food Insecurity: Not on file  Transportation Needs: Not on file  Physical Activity: Not on file  Stress: Not on file  Social Connections: Not on file  Intimate Partner Violence: Not on file    Outpatient Medications Prior to Visit  Medication Sig Dispense Refill   aspirin 81 MG tablet Take 81 mg by mouth daily.     atorvastatin (LIPITOR) 20 MG tablet Take 1 tablet by mouth once daily 90 tablet 1   Cholecalciferol (VITAMIN D3) 1000 UNITS CAPS Take 2,000 Units by mouth daily.     fluticasone (FLONASE) 50 MCG/ACT nasal spray Place 2 sprays into both nostrils daily. 16 g 1   hydrochlorothiazide (HYDRODIURIL) 25 MG tablet Take 1 tablet (25 mg total) by mouth daily. 30 tablet 3   insulin detemir (LEVEMIR) 100 UNIT/ML injection INJECT  12 UNITS SUBCUTANEOUSLY AT BEDTIME 10 mL 1   NOVOLOG 100 UNIT/ML injection INJECT 7 TO 20 UNITS SUBCUTANEOUSLY THREE TIMES DAILY WITH MEALS 40 mL 0   predniSONE (DELTASONE) 10 MG tablet 4 tabs by mouth once daily for 2 days, then 3 tabs daily x 2 days, then 2 tabs daily x 2 days, then 1 tab daily x 2 days 20 tablet 0   sertraline (ZOLOFT) 50 MG tablet Take 1 tablet by mouth once daily 90 tablet 0   metoprolol succinate (TOPROL-XL) 50 MG 24 hr tablet Take 1 tablet (50 mg total) by mouth daily. Take with or immediately following a meal. 30 tablet 1   No facility-administered medications prior to visit.    Allergies  Allergen Reactions   Humalog [Insulin Lispro] Other (See Comments)    Sugar drops dangerously low    Lisinopril     Hyperkalemia    Viagra [Sildenafil Citrate] Other (See Comments)    headache    ROS See HPI    Objective:    Physical Exam Constitutional:      General: He is not in acute distress.    Appearance: Normal appearance. He is not ill-appearing.  HENT:     Head: Normocephalic and atraumatic.     Right Ear: External ear normal.     Left Ear: External ear normal.  Eyes:     Extraocular Movements: Extraocular movements intact.     Pupils: Pupils are equal, round, and reactive to light.  Cardiovascular:     Rate and Rhythm: Normal rate and regular rhythm.     Heart sounds: Normal heart sounds. No murmur heard.    No gallop.  Pulmonary:     Effort: Pulmonary effort is normal. No respiratory distress.     Breath sounds: Normal breath sounds. No wheezing or rales.  Skin:    General: Skin is warm and dry.     Comments: Some scabs on bilateral forearms  Neurological:     Mental Status: He is alert and oriented to person, place, and time.  Psychiatric:        Mood and Affect: Mood normal.        Behavior: Behavior normal.        Judgment: Judgment normal.     BP (!) 168/69 (BP Location: Right Arm, Patient Position: Sitting, Cuff Size: Large)  Pulse  (!) 51   Temp 97.8 F (36.6 C) (Oral)   Resp 16   Wt 202 lb (91.6 kg)   SpO2 100%   BMI 31.64 kg/m  Wt Readings from Last 3 Encounters:  10/17/21 202 lb (91.6 kg)  10/10/21 201 lb (91.2 kg)  05/12/21 198 lb 12.8 oz (90.2 kg)       Assessment & Plan:   Problem List Items Addressed This Visit       Unprioritized   Poison ivy dermatitis    Resolving.       Essential hypertension    BP Readings from Last 3 Encounters:  10/10/21 (!) 151/52  05/12/21 114/72  01/28/21 136/70  Last visit we decreased toprol xl from 100mg  once daily to 50mg  once daily and add HCTZ 25mg  once daily. He is tolerating this regimen, HR has come up from 46 to 51 bpm. Had hyperkalemia with ACE, dizziness with CCB.  Advised pt as follows:  Stop metoprolol, start carvedilol 12.5mg  twice daily. Continue hctz once daily. Complete lab work prior to leaving. Check blood pressure and heart rate once daily on this new regimen for a few days and send me your readings via mychart.       Relevant Medications   carvedilol (COREG) 12.5 MG tablet   Other Relevant Orders   Basic metabolic panel    Meds ordered this encounter  Medications   carvedilol (COREG) 12.5 MG tablet    Sig: Take 1 tablet (12.5 mg total) by mouth 2 (two) times daily with a meal.    Dispense:  60 tablet    Refill:  3    Order Specific Question:   Supervising Provider    Answer:   A [[id number]]    I, , NP, personally preformed the services described in this documentation.  All medical record entries made by the scribe were at my direction and in my presence.  I have reviewed the chart and discharge instructions (if applicable) and agree that the record reflects my personal performance and is accurate and complete. 10/17/2021   I,Amber Collins,acting as a Danise Edge for Lemont Fillers, NP.,have documented all relevant documentation on the behalf of 10/19/2021, NP,as directed by  Neurosurgeon, NP while in the presence of Lemont Fillers, NP.   Lemont Fillers, NP

## 2021-10-17 NOTE — Patient Instructions (Signed)
Stop metoprolol, start carvedilol 12.5mg  twice daily. Continue hctz once daily. Complete lab work prior to leaving. Check blood pressure and heart rate once daily on this new regimen for a few days and send me your readings via mychart.

## 2021-10-17 NOTE — Assessment & Plan Note (Signed)
Resolving

## 2021-10-17 NOTE — Assessment & Plan Note (Addendum)
BP Readings from Last 3 Encounters:  10/10/21 (!) 151/52  05/12/21 114/72  01/28/21 136/70   Last visit we decreased toprol xl from 100mg  once daily to 50mg  once daily and add HCTZ 25mg  once daily. He is tolerating this regimen, HR has come up from 46 to 51 bpm. Had hyperkalemia with ACE, dizziness with CCB.  Advised pt as follows:  Stop metoprolol, start carvedilol 12.5mg  twice daily. Continue hctz once daily. Complete lab work prior to leaving. Check blood pressure and heart rate once daily on this new regimen for a few days and send me your readings via mychart.

## 2021-10-20 ENCOUNTER — Encounter: Payer: Self-pay | Admitting: Family

## 2021-10-20 ENCOUNTER — Other Ambulatory Visit (INDEPENDENT_AMBULATORY_CARE_PROVIDER_SITE_OTHER): Payer: Managed Care, Other (non HMO)

## 2021-10-20 ENCOUNTER — Telehealth: Payer: Self-pay | Admitting: Family

## 2021-10-20 DIAGNOSIS — E103292 Type 1 diabetes mellitus with mild nonproliferative diabetic retinopathy without macular edema, left eye: Secondary | ICD-10-CM

## 2021-10-20 LAB — HEMOGLOBIN A1C: Hgb A1c MFr Bld: 7.2 % — ABNORMAL HIGH (ref 4.6–6.5)

## 2021-10-20 NOTE — Telephone Encounter (Signed)
Sugar was quite high the day we saw him. Please ask him to return to the lab for A1C.

## 2021-10-20 NOTE — Telephone Encounter (Signed)
Patient will come have labs today

## 2021-10-21 ENCOUNTER — Encounter: Payer: Self-pay | Admitting: Family

## 2021-10-21 MED ORDER — HYDRALAZINE HCL 25 MG PO TABS: 25.0000 mg | ORAL_TABLET | Freq: Three times a day (TID) | ORAL | 1 refills | Status: DC

## 2021-10-21 NOTE — Addendum Note (Signed)
Addended by: [name] on: 10/21/2021 07:37 AM   Modules accepted: Orders

## 2021-10-23 ENCOUNTER — Encounter: Payer: Self-pay | Admitting: Family

## 2021-10-23 MED ORDER — HYDRALAZINE HCL 25 MG PO TABS: 50.0000 mg | ORAL_TABLET | Freq: Three times a day (TID) | ORAL | 1 refills | Status: DC

## 2021-10-24 ENCOUNTER — Encounter: Payer: Self-pay | Admitting: Family

## 2021-10-24 NOTE — Telephone Encounter (Signed)
Kyra Leyland "Ron"  [facility] Phone Number: [phone]   At 8:20 am 189/95 pulse 87

## 2021-10-24 NOTE — Telephone Encounter (Signed)
At 3:50 am 179/91 pulse 68

## 2021-11-04 ENCOUNTER — Encounter: Payer: Self-pay | Admitting: Family

## 2021-11-04 NOTE — Telephone Encounter (Signed)
Pt stated that he had a headache which prompted him to check his blood pressure. Upon further discussion, I discovered that instead of adding the hydralazine to his bp regimen, he stopped the carvedilol and hctz.  Advised pt to take dose of carvedilol and hctz now and to resume those meds along with hydralazine. He will continue to monitor these medications and will let us know if his bp does not return to normal.

## 2021-11-06 ENCOUNTER — Encounter: Payer: Self-pay | Admitting: Family

## 2021-11-10 ENCOUNTER — Encounter: Payer: Self-pay | Admitting: Family

## 2021-11-11 ENCOUNTER — Other Ambulatory Visit: Payer: Self-pay | Admitting: Family Medicine

## 2021-11-11 ENCOUNTER — Ambulatory Visit (HOSPITAL_BASED_OUTPATIENT_CLINIC_OR_DEPARTMENT_OTHER)
Admission: RE | Admit: 2021-11-11 | Discharge: 2021-11-11 | Disposition: A | Discharge status: Home / Self Care | Payer: PRIVATE HEALTH INSURANCE | Source: Ambulatory Visit | Attending: Family Medicine | Admitting: Family Medicine

## 2021-11-11 ENCOUNTER — Ambulatory Visit (INDEPENDENT_AMBULATORY_CARE_PROVIDER_SITE_OTHER): Payer: PRIVATE HEALTH INSURANCE | Admitting: Family Medicine

## 2021-11-11 VITALS — BP 138/72 | HR 58 | Temp 97.7°F | Resp 16 | Ht 67.0 in | Wt 200.6 lb

## 2021-11-11 DIAGNOSIS — I10 Essential (primary) hypertension: Secondary | ICD-10-CM

## 2021-11-11 DIAGNOSIS — E782 Mixed hyperlipidemia: Secondary | ICD-10-CM

## 2021-11-11 DIAGNOSIS — R109 Unspecified abdominal pain: Secondary | ICD-10-CM | POA: Diagnosis present

## 2021-11-11 DIAGNOSIS — D649 Anemia, unspecified: Secondary | ICD-10-CM

## 2021-11-11 DIAGNOSIS — R1011 Right upper quadrant pain: Secondary | ICD-10-CM | POA: Diagnosis not present

## 2021-11-11 DIAGNOSIS — Z23 Encounter for immunization: Secondary | ICD-10-CM

## 2021-11-11 LAB — COMPREHENSIVE METABOLIC PANEL
ALT: 22 U/L (ref 0–53)
AST: 20 U/L (ref 0–37)
Albumin: 4 g/dL (ref 3.5–5.2)
Alkaline Phosphatase: 78 U/L (ref 39–117)
BUN: 29 mg/dL — ABNORMAL HIGH (ref 6–23)
CO2: 32 mEq/L (ref 19–32)
Calcium: 9.4 mg/dL (ref 8.4–10.5)
Chloride: 100 mEq/L (ref 96–112)
Creatinine, Ser: 1.01 mg/dL (ref 0.40–1.50)
GFR: 80.35 mL/min (ref >60.00)
Glucose, Bld: 145 mg/dL — ABNORMAL HIGH (ref 70–99)
Potassium: 4.8 mEq/L (ref 3.5–5.1)
Sodium: 140 mEq/L (ref 135–145)
Total Bilirubin: 0.5 mg/dL (ref 0.2–1.2)
Total Protein: 6.4 g/dL (ref 6.0–8.3)

## 2021-11-11 LAB — CBC WITH DIFFERENTIAL/PLATELET
Basophils Absolute: 0 10*3/uL (ref 0.0–0.1)
Basophils Relative: 0.7 % (ref 0.0–3.0)
Eosinophils Absolute: 0.2 10*3/uL (ref 0.0–0.7)
Eosinophils Relative: 2.7 % (ref 0.0–5.0)
HCT: 38.8 % — ABNORMAL LOW (ref 39.0–52.0)
Hemoglobin: 12.7 g/dL — ABNORMAL LOW (ref 13.0–17.0)
Lymphocytes Relative: 22.8 % (ref 12.0–46.0)
Lymphs Abs: 1.6 10*3/uL (ref 0.7–4.0)
MCHC: 32.7 g/dL (ref 30.0–36.0)
MCV: 90.4 fl (ref 78.0–100.0)
Monocytes Absolute: 0.8 10*3/uL (ref 0.1–1.0)
Monocytes Relative: 10.9 % (ref 3.0–12.0)
Neutro Abs: 4.4 10*3/uL (ref 1.4–7.7)
Neutrophils Relative %: 62.9 % (ref 43.0–77.0)
Platelets: 198 10*3/uL (ref 150.0–400.0)
RBC: 4.29 Mil/uL (ref 4.22–5.81)
RDW: 12.6 % (ref 11.5–15.5)
WBC: 7 10*3/uL (ref 4.0–10.5)

## 2021-11-11 LAB — URINALYSIS
Bilirubin Urine: NEGATIVE
Hgb urine dipstick: NEGATIVE
Ketones, ur: NEGATIVE
Leukocytes,Ua: NEGATIVE
Nitrite: NEGATIVE
Specific Gravity, Urine: 1.025 (ref 1.000–1.030)
Total Protein, Urine: NEGATIVE
Urine Glucose: NEGATIVE
Urobilinogen, UA: 0.2 (ref 0.0–1.0)
pH: 5.5 (ref 5.0–8.0)

## 2021-11-11 MED ORDER — NOVOLOG 100 UNIT/ML IJ SOLN: INTRAMUSCULAR | 0 refills | Status: DC

## 2021-11-11 NOTE — Progress Notes (Signed)
Subjective:   By signing my name below, I, [name], attest that this documentation has been prepared under the direction and in the presence of Bradd Canary, MD 11/11/2021.     Patient ID: Scott Adkins, male    DOB: [birth year], 61 y.o.   MRN: [id number]  No chief complaint on file.  HPI Patient is in today for an office visit.  Blood pressure: His blood pressure is within normal range today. He has been taking Carvedilol 12.5 mg to manage his blood pressure. He reports that his blood pressure was 200/100 before he was admitted to the ER for hypoglycemia. He denies chest pain.  BP Readings from Last 3 Encounters:  11/11/21 138/72  10/17/21 (!) 168/69  10/10/21 (!) 151/52   Blood sugar: His blood sugar is 85 mg/dL during today's visit. He states that his blood sugar drops to around 60 mg/dL at 9:02-4 pm daily. He states that he usually eats lunch at 11:30 am. He reports that he has seen an endocrinologist in the past. He is requesting a prescription for a Free Style Libre 3 sensor.   Lab Results  Component Value Date   HGBA1C 7.2 (H) 10/20/2021   Headaches: He denies any headaches since his ER visit.   Flank pain/ Upper quadrant pain: He complains of right-sided flank pain/ right upper quadrant pain that has been constant for a month and a half. He suspects that his kidney is causing the pain. He states that there is nothing that worsens or lessens the pain. He denies taking Advil or any topical applications to help manage the pain. He rates the pain as 5/10.   Immunizations: He is inquiring about his Shingles immunization and will be receiving it today.  Past Medical History:  Diagnosis Date   Anemia 12/22/2015   Arthritis    Chicken pox as a child   Constipation 12/22/2016   Diabetes mellitus type I (HCC)    uses insulin pump// managed by Dr. 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   Hyperlipidemia    Hypertension     Injury of right rotator cuff 08/16/2012   Has been seen by [facility]   Left flank pain 09/12/2015   Mumps as a child   Pain in joint, lower leg 01/24/2016   Preventative health care 02/19/2013   Sun-damaged skin 12/06/2015   Past Surgical History:  Procedure Laterality Date   insulin pump     placed and removed   REFRACTIVE SURGERY Right 2007   [facility]   ROTATOR CUFF REPAIR  2004   left   Family History  Problem Relation Age of Onset   Hypertension Mother    Stroke Father    Hypertension Father    Hyperlipidemia Father    Hypertension Brother    Hyperlipidemia Brother    Mental illness Brother        depression   Heart disease Maternal Grandmother        MI   Heart disease Maternal Grandfather        MI   Hyperlipidemia Brother    Hypertension Brother    Colon cancer Neg Hx    Prostate cancer Neg Hx    Breast cancer Neg Hx    Rectal cancer Neg Hx    Diabetes Neg Hx     Social History   Socioeconomic History   Marital status: Married    Spouse name: Not on file  Number of children: 3   Years of education: Not on file   Highest education level: Not on file  Occupational History   Occupation: welder  Tobacco Use   Smoking status: Never   Smokeless tobacco: Never  Substance and Sexual Activity   Alcohol use: No    Alcohol/week: 1.0 standard drink of alcohol    Types: 1 Shots of liquor per week   Drug use: No   Sexual activity: Not on file    Comment: lives with wife and mother in law, no dietary restrictions  Other Topics Concern   Not on file  Social History Narrative   Married 14 yrs   2 daughters; 1 son   Occupation: Psychologist, occupational      Social Determinants of Corporate investment banker Strain: Not on file  Food Insecurity: Not on file  Transportation Needs: Not on file  Physical Activity: Not on file  Stress: Not on file  Social Connections: Not on file  Intimate Partner Violence: Not on file   Outpatient Medications Prior to Visit   Medication Sig Dispense Refill   aspirin 81 MG tablet Take 81 mg by mouth daily.     atorvastatin (LIPITOR) 20 MG tablet Take 1 tablet by mouth once daily 90 tablet 1   carvedilol (COREG) 12.5 MG tablet Take 1 tablet (12.5 mg total) by mouth 2 (two) times daily with a meal. 60 tablet 3   Cholecalciferol (VITAMIN D3) 1000 UNITS CAPS Take 2,000 Units by mouth daily.     fluticasone (FLONASE) 50 MCG/ACT nasal spray Place 2 sprays into both nostrils daily. 16 g 1   hydrALAZINE (APRESOLINE) 25 MG tablet Take 2 tablets (50 mg total) by mouth 3 (three) times daily. 90 tablet 1   hydrochlorothiazide (HYDRODIURIL) 25 MG tablet Take 1 tablet (25 mg total) by mouth daily. 30 tablet 3   insulin detemir (LEVEMIR) 100 UNIT/ML injection INJECT 12 UNITS SUBCUTANEOUSLY AT BEDTIME 10 mL 1   NOVOLOG 100 UNIT/ML injection INJECT 7 TO 20 UNITS SUBCUTANEOUSLY THREE TIMES DAILY WITH MEALS 40 mL 0   predniSONE (DELTASONE) 10 MG tablet 4 tabs by mouth once daily for 2 days, then 3 tabs daily x 2 days, then 2 tabs daily x 2 days, then 1 tab daily x 2 days 20 tablet 0   sertraline (ZOLOFT) 50 MG tablet Take 1 tablet by mouth once daily 90 tablet 0   No facility-administered medications prior to visit.   Allergies  Allergen Reactions   Humalog [Insulin Lispro] Other (See Comments)    Sugar drops dangerously low    Lisinopril     Hyperkalemia    Viagra [Sildenafil Citrate] Other (See Comments)    headache   Review of Systems  Cardiovascular:  Negative for chest pain.  Genitourinary:  Positive for flank pain (Right sided).  Neurological:  Negative for headaches.  Endo/Heme/Allergies:               Objective:    Physical Exam Constitutional:      General: He is not in acute distress.    Appearance: Normal appearance. He is not ill-appearing.  HENT:     Head: Normocephalic and atraumatic.     Right Ear: External ear normal.     Left Ear: External ear normal.  Eyes:     Extraocular Movements:  Extraocular movements intact.     Pupils: Pupils are equal, round, and reactive to light.  Cardiovascular:     Rate and Rhythm: Normal  rate and regular rhythm.     Pulses: Normal pulses.     Heart sounds: Normal heart sounds. No murmur heard.    No gallop.  Pulmonary:     Effort: Pulmonary effort is normal. No respiratory distress.     Breath sounds: Normal breath sounds. No wheezing or rales.  Abdominal:     General: Bowel sounds are normal.     Palpations: Abdomen is soft.     Tenderness: There is no abdominal tenderness.  Skin:    General: Skin is warm and dry.  Neurological:     Mental Status: He is alert and oriented to person, place, and time.  Psychiatric:        Mood and Affect: Mood normal.        Behavior: Behavior normal.        Judgment: Judgment normal.    There were no vitals taken for this visit. Wt Readings from Last 3 Encounters:  10/17/21 202 lb (91.6 kg)  10/10/21 201 lb (91.2 kg)  05/12/21 198 lb 12.8 oz (90.2 kg)   Diabetic Foot Exam - Simple   No data filed    Lab Results  Component Value Date   WBC 8.4 05/12/2021   HGB 13.5 05/12/2021   HCT 42.3 05/12/2021   PLT 187.0 05/12/2021   GLUCOSE 280 (H) 10/17/2021   CHOL 163 05/12/2021   TRIG 82.0 05/12/2021   HDL 54.50 05/12/2021   LDLDIRECT 71 09/10/2008   LDLCALC 92 05/12/2021   ALT 23 05/12/2021   AST 23 05/12/2021   NA 137 10/17/2021   K 4.4 10/17/2021   CL 102 10/17/2021   CREATININE 1.02 10/17/2021   BUN 29 (H) 10/17/2021   CO2 27 10/17/2021   TSH 2.64 05/12/2021   PSA 0.40 05/12/2021   HGBA1C 7.2 (H) 10/20/2021   MICROALBUR 1.2 05/12/2021   Lab Results  Component Value Date   TSH 2.64 05/12/2021   Lab Results  Component Value Date   WBC 8.4 05/12/2021   HGB 13.5 05/12/2021   HCT 42.3 05/12/2021   MCV 89.5 05/12/2021   PLT 187.0 05/12/2021   Lab Results  Component Value Date   NA 137 10/17/2021   K 4.4 10/17/2021   CO2 27 10/17/2021   GLUCOSE 280 (H) 10/17/2021   BUN  29 (H) 10/17/2021   CREATININE 1.02 10/17/2021   BILITOT 0.5 05/12/2021   ALKPHOS 82 05/12/2021   AST 23 05/12/2021   ALT 23 05/12/2021   PROT 6.9 05/12/2021   ALBUMIN 4.4 05/12/2021   CALCIUM 8.9 10/17/2021   GFR 79.44 10/17/2021   Lab Results  Component Value Date   CHOL 163 05/12/2021   Lab Results  Component Value Date   HDL 54.50 05/12/2021   Lab Results  Component Value Date   LDLCALC 92 05/12/2021   Lab Results  Component Value Date   TRIG 82.0 05/12/2021   Lab Results  Component Value Date   CHOLHDL 3 05/12/2021   Lab Results  Component Value Date   HGBA1C 7.2 (H) 10/20/2021      Assessment & Plan:   Problem List Items Addressed This Visit   None  No orders of the defined types were placed in this encounter.  I, [name], personally preformed the services described in this documentation.  All medical record entries made by the scribe were at my direction and in my presence.  I have reviewed the chart and discharge instructions (if applicable) and agree that the record  reflects my personal performance and is accurate and complete. 11/11/2021.  I,Mohammed Iqbal,acting as a scribe for Danise Edge, MD.,have documented all relevant documentation on the behalf of Danise Edge, MD,as directed by  Danise Edge, MD while in the presence of Danise Edge, MD.  [name]

## 2021-11-11 NOTE — Patient Instructions (Signed)
Flank Pain, Adult ?Flank pain is pain that is located on the side of the body between the upper abdomen and the spine. This area is called the flank. The pain may occur over a short period of time (acute), or it may be long-term or recurring (chronic). It may be mild or severe. Flank pain can be caused by many things, including: ?Muscle soreness or injury. ?Kidney infection, kidney stones, or kidney disease. ?Stress. ?A disease of the spine (vertebral disk disease). ?A lung infection (pneumonia). ?Fluid around the lungs (pulmonary edema). ?A skin rash caused by the chickenpox virus (shingles). ?Tumors that affect the back of the abdomen. ?Gallbladder disease. ?Follow these instructions at home: ? ?Drink enough fluid to keep your urine pale yellow. ?Rest as told by your health care provider. ?Take over-the-counter and prescription medicines only as told by your health care provider. ?Keep a journal to track what has caused your flank pain and what has made it feel better. ?Keep all follow-up visits. This is important. ?Contact a health care provider if: ?Your pain is not controlled with medicine. ?You have new symptoms. ?Your pain gets worse. ?Your symptoms last longer than 2-3 days. ?You have trouble urinating or you are urinating very frequently. ?Get help right away if: ?You have trouble breathing or you are short of breath. ?Your abdomen hurts or it is swollen or red. ?You have nausea or vomiting. ?You feel faint, or you faint. ?You have blood in your urine. ?You have flank pain and a fever. ?These symptoms may represent a serious problem that is an emergency. Do not wait to see if the symptoms will go away. Get medical help right away. Call your local emergency services (911 in the [place] Do not drive yourself to the hospital. ?Summary ?Flank pain is pain that is located on the side of the body between the upper abdomen and the spine. ?The pain may occur over a short period of time (acute), or it may be  long-term or recurring (chronic). It may be mild or severe. ?Flank pain can be caused by many things. ?Contact your health care provider if your symptoms get worse or last longer than 2-3 days. ?This information is not intended to replace advice given to you by your health care provider. Make sure you discuss any questions you have with your health care provider. ?Document Revised: 06/03/2020 Document Reviewed: 06/03/2020 ?Elsevier Patient Education ? 2023 Elsevier Inc. ? ?

## 2021-11-12 ENCOUNTER — Other Ambulatory Visit: Payer: Self-pay

## 2021-11-12 DIAGNOSIS — Z23 Encounter for immunization: Secondary | ICD-10-CM | POA: Insufficient documentation

## 2021-11-12 DIAGNOSIS — J45909 Unspecified asthma, uncomplicated: Secondary | ICD-10-CM

## 2021-11-12 LAB — URINE CULTURE
MICRO NUMBER:: 13750250
SPECIMEN QUALITY:: ADEQUATE

## 2021-11-13 ENCOUNTER — Encounter: Payer: Self-pay | Admitting: Family Medicine

## 2021-11-13 ENCOUNTER — Telehealth (HOSPITAL_BASED_OUTPATIENT_CLINIC_OR_DEPARTMENT_OTHER): Payer: Self-pay

## 2021-11-13 ENCOUNTER — Ambulatory Visit: Payer: Managed Care, Other (non HMO) | Admitting: Family Medicine

## 2021-11-13 ENCOUNTER — Ambulatory Visit (HOSPITAL_BASED_OUTPATIENT_CLINIC_OR_DEPARTMENT_OTHER): Admission: RE | Admit: 2021-11-13 | Payer: PRIVATE HEALTH INSURANCE | Source: Ambulatory Visit

## 2021-11-13 NOTE — Assessment & Plan Note (Signed)
Second shot given today

## 2021-11-13 NOTE — Assessment & Plan Note (Signed)
Patient hgba1c acceptably but recently increased lability to his sugars. He had to present to ED with sugar in high 20s. He has also noted some sugars over 400 also. Will set him up with CGM

## 2021-11-13 NOTE — Assessment & Plan Note (Signed)
Check labs and an abdominal ultrasound. Report worsening symptoms

## 2021-11-13 NOTE — Assessment & Plan Note (Signed)
Well controlled, no changes to meds. Encouraged heart healthy diet such as the DASH diet and exercise as tolerated.  °

## 2021-11-13 NOTE — Assessment & Plan Note (Signed)
Tolerating statin, encouraged heart healthy diet, avoid trans fats, minimize simple carbs and saturated fats. Increase exercise as tolerated 

## 2021-11-14 ENCOUNTER — Other Ambulatory Visit: Payer: Self-pay

## 2021-11-14 ENCOUNTER — Ambulatory Visit: Payer: PRIVATE HEALTH INSURANCE | Admitting: Family

## 2021-11-14 DIAGNOSIS — E103292 Type 1 diabetes mellitus with mild nonproliferative diabetic retinopathy without macular edema, left eye: Secondary | ICD-10-CM

## 2021-11-14 DIAGNOSIS — I10 Essential (primary) hypertension: Secondary | ICD-10-CM

## 2021-11-14 MED ORDER — HYDRALAZINE HCL 25 MG PO TABS: 50.0000 mg | ORAL_TABLET | Freq: Three times a day (TID) | ORAL | 1 refills | Status: DC

## 2021-11-14 MED ORDER — NOVOLOG 100 UNIT/ML IJ SOLN: INTRAMUSCULAR | 0 refills | Status: DC

## 2021-11-14 NOTE — Telephone Encounter (Signed)
Prescription was sent

## 2021-11-14 NOTE — Patient Instructions (Signed)
Stop metoprolol, start carvedilol, restart hydralazine.

## 2021-11-14 NOTE — Assessment & Plan Note (Addendum)
BP Readings from Last 3 Encounters:  11/14/21 (!) 133/57  11/11/21 138/72  10/17/21 (!) 168/69   Turns out he ran out of hydralazine so he restarted the metoprolol along with carvedilol and hctz.  Advised him not to do that. Stop metoprolol, start carvedilol, restart hydralazine continue hctz. Follow up in 1 month.

## 2021-11-14 NOTE — Progress Notes (Signed)
Subjective:   By signing my name below, I, Scott Adkins, attest that this documentation has been prepared under the direction and in the presence of Scott Adkins' [place], NP 11/14/2021   Patient ID: Scott Adkins, male    DOB: Sep 24, 1960, 61 y.o.   MRN: [id number]  Chief Complaint  Patient presents with   Hypertension    Here for follow up    HPI Patient is in today for an office visit  Refills: He is requesting a refill of 25 Mg of Hydralazine.  Blood Pressure: As of today's visit, his blood pressure is normal. He states that he has ran out of his Hydralazine medication about two weeks ago and was therefore taking his old Metoprolol and 12.5 Mg of Carvedilol.  BP Readings from Last 3 Encounters:  11/14/21 (!) 133/57  11/11/21 138/72  10/17/21 (!) 168/69   Pulse Readings from Last 3 Encounters:  11/14/21 (!) 50  11/11/21 (!) 58  10/17/21 (!) 51   Health Maintenance Due  Topic Date Due   INFLUENZA VACCINE  11/04/2021    Past Medical History:  Diagnosis Date   Anemia 12/22/2015   Arthritis    Chicken pox as a child   Constipation 12/22/2016   Diabetes mellitus type I (HCC)    uses insulin pump// managed by Dr. 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   Hyperlipidemia    Hypertension    Injury of right rotator cuff 08/16/2012   Has been seen by [facility]   Left flank pain 09/12/2015   Mumps as a child   Pain in joint, lower leg 01/24/2016   Preventative health care 02/19/2013   Sun-damaged skin 12/06/2015    Past Surgical History:  Procedure Laterality Date   insulin pump     placed and removed   REFRACTIVE SURGERY Right 2007   [facility]   ROTATOR CUFF REPAIR  2004   left    Family History  Problem Relation Age of Onset   Hypertension Mother    Stroke Father    Hypertension Father    Hyperlipidemia Father    Hypertension Brother    Hyperlipidemia Brother    Mental illness Brother         depression   Heart disease Maternal Grandmother        MI   Heart disease Maternal Grandfather        MI   Hyperlipidemia Brother    Hypertension Brother    Colon cancer Neg Hx    Prostate cancer Neg Hx    Breast cancer Neg Hx    Rectal cancer Neg Hx    Diabetes Neg Hx     Social History   Socioeconomic History   Marital status: Married    Spouse name: Not on file   Number of children: 3   Years of education: Not on file   Highest education level: Not on file  Occupational History   Occupation: welder  Tobacco Use   Smoking status: Never   Smokeless tobacco: Never  Substance and Sexual Activity   Alcohol use: No    Alcohol/week: 1.0 standard drink of alcohol    Types: 1 Shots of liquor per week   Drug use: No   Sexual activity: Not on file    Comment: lives with wife and mother in law, no dietary restrictions  Other Topics Concern   Not on file  Social History Narrative   Married  14 yrs   2 daughters; 1 son   Occupation: welder      International aid/development worker of Corporate investment banker Strain: Not on file  Food Insecurity: Not on file  Transportation Needs: Not on file  Physical Activity: Not on file  Stress: Not on file  Social Connections: Not on file  Intimate Partner Violence: Not on file    Outpatient Medications Prior to Visit  Medication Sig Dispense Refill   aspirin 81 MG tablet Take 81 mg by mouth daily.     atorvastatin (LIPITOR) 20 MG tablet Take 1 tablet by mouth once daily 90 tablet 0   carvedilol (COREG) 12.5 MG tablet Take 1 tablet (12.5 mg total) by mouth 2 (two) times daily with a meal. 60 tablet 3   Cholecalciferol (VITAMIN D3) 1000 UNITS CAPS Take 2,000 Units by mouth daily.     fluticasone (FLONASE) 50 MCG/ACT nasal spray Place 2 sprays into both nostrils daily. 16 g 1   hydrochlorothiazide (HYDRODIURIL) 25 MG tablet Take 1 tablet (25 mg total) by mouth daily. 30 tablet 3   insulin detemir (LEVEMIR) 100 UNIT/ML injection INJECT 12 UNITS  SUBCUTANEOUSLY AT BEDTIME 10 mL 1   NOVOLOG 100 UNIT/ML injection 4 units SQ with breakfast and dinner 40 mL 0   predniSONE (DELTASONE) 10 MG tablet 4 tabs by mouth once daily for 2 days, then 3 tabs daily x 2 days, then 2 tabs daily x 2 days, then 1 tab daily x 2 days 20 tablet 0   sertraline (ZOLOFT) 50 MG tablet Take 1 tablet by mouth once daily 90 tablet 0   hydrALAZINE (APRESOLINE) 25 MG tablet Take 2 tablets (50 mg total) by mouth 3 (three) times daily. 90 tablet 1   No facility-administered medications prior to visit.    Allergies  Allergen Reactions   Humalog [Insulin Lispro] Other (See Comments)    Sugar drops dangerously low    Lisinopril     Hyperkalemia    Viagra [Sildenafil Citrate] Other (See Comments)    headache    ROS    See HPI Objective:    Physical Exam Constitutional:      General: He is not in acute distress.    Appearance: Normal appearance. He is not ill-appearing.  HENT:     Head: Normocephalic and atraumatic.     Right Ear: External ear normal.     Left Ear: External ear normal.  Eyes:     Extraocular Movements: Extraocular movements intact.     Pupils: Pupils are equal, round, and reactive to light.  Cardiovascular:     Rate and Rhythm: Normal rate and regular rhythm.     Heart sounds: Normal heart sounds. No murmur heard.    No gallop.  Pulmonary:     Effort: Pulmonary effort is normal. No respiratory distress.     Breath sounds: Normal breath sounds. No wheezing or rales.  Skin:    General: Skin is warm and dry.  Neurological:     Mental Status: He is alert and oriented to person, place, and time.  Psychiatric:        Mood and Affect: Mood normal.        Behavior: Behavior normal.        Judgment: Judgment normal.     BP (!) 133/57 (BP Location: Right Arm, Patient Position: Sitting, Cuff Size: Small)   Pulse (!) 50   Temp 97.9 F (36.6 C) (Oral)   Resp 16   Wt  199 lb (90.3 kg)   SpO2 100%   BMI 31.17 kg/m  Wt Readings from  Last 3 Encounters:  11/14/21 199 lb (90.3 kg)  11/11/21 200 lb 9.6 oz (91 kg)  10/17/21 202 lb (91.6 kg)       Assessment & Plan:   Problem List Items Addressed This Visit       Unprioritized   Essential hypertension    BP Readings from Last 3 Encounters:  11/14/21 (!) 133/57  11/11/21 138/72  10/17/21 (!) 168/69  Turns out he ran out of hydralazine so he restarted the metoprolol along with carvedilol and hctz.  Advised him not to do that. Stop metoprolol, start carvedilol, restart hydralazine continue hctz. Follow up in 1 month.       Relevant Medications   hydrALAZINE (APRESOLINE) 25 MG tablet    Meds ordered this encounter  Medications   hydrALAZINE (APRESOLINE) 25 MG tablet    Sig: Take 2 tablets (50 mg total) by mouth 3 (three) times daily.    Dispense:  270 tablet    Refill:  1    Order Specific Question:   Supervising Provider    Answer:   Danise Edge A [[id number]]    I, Lemont Fillers, NP, personally preformed the services described in this documentation.  All medical record entries made by the scribe were at my direction and in my presence.  I have reviewed the chart and discharge instructions (if applicable) and agree that the record reflects my personal performance and is accurate and complete. 11/14/2021   I,Amber Collins,acting as a Neurosurgeon for Lemont Fillers, NP.,have documented all relevant documentation on the behalf of Lemont Fillers, NP,as directed by  Lemont Fillers, NP while in the presence of Lemont Fillers, NP.    Lemont Fillers, NP

## 2021-11-21 ENCOUNTER — Other Ambulatory Visit: Payer: Self-pay | Admitting: Family Medicine

## 2021-11-21 DIAGNOSIS — E103292 Type 1 diabetes mellitus with mild nonproliferative diabetic retinopathy without macular edema, left eye: Secondary | ICD-10-CM

## 2021-12-08 ENCOUNTER — Other Ambulatory Visit: Payer: Self-pay | Admitting: Family Medicine

## 2021-12-08 DIAGNOSIS — E103292 Type 1 diabetes mellitus with mild nonproliferative diabetic retinopathy without macular edema, left eye: Secondary | ICD-10-CM

## 2021-12-10 ENCOUNTER — Other Ambulatory Visit (INDEPENDENT_AMBULATORY_CARE_PROVIDER_SITE_OTHER): Payer: No Typology Code available for payment source

## 2021-12-10 DIAGNOSIS — J45909 Unspecified asthma, uncomplicated: Secondary | ICD-10-CM

## 2021-12-10 LAB — IBC PANEL
Iron: 69 ug/dL (ref 42–165)
Saturation Ratios: 23.5 % (ref 20.0–50.0)
TIBC: 294 ug/dL (ref 250.0–450.0)
Transferrin: 210 mg/dL — ABNORMAL LOW (ref 212.0–360.0)

## 2021-12-10 LAB — CBC WITH DIFFERENTIAL/PLATELET
Basophils Absolute: 0.1 10*3/uL (ref 0.0–0.1)
Basophils Relative: 1.1 % (ref 0.0–3.0)
Eosinophils Absolute: 0.2 10*3/uL (ref 0.0–0.7)
Eosinophils Relative: 2.9 % (ref 0.0–5.0)
HCT: 41.2 % (ref 39.0–52.0)
Hemoglobin: 13.4 g/dL (ref 13.0–17.0)
Lymphocytes Relative: 19 % (ref 12.0–46.0)
Lymphs Abs: 1.1 10*3/uL (ref 0.7–4.0)
MCHC: 32.5 g/dL (ref 30.0–36.0)
MCV: 90.7 fl (ref 78.0–100.0)
Monocytes Absolute: 0.7 10*3/uL (ref 0.1–1.0)
Monocytes Relative: 11.4 % (ref 3.0–12.0)
Neutro Abs: 3.9 10*3/uL (ref 1.4–7.7)
Neutrophils Relative %: 65.6 % (ref 43.0–77.0)
Platelets: 161 10*3/uL (ref 150.0–400.0)
RBC: 4.54 Mil/uL (ref 4.22–5.81)
RDW: 12.7 % (ref 11.5–15.5)
WBC: 5.9 10*3/uL (ref 4.0–10.5)

## 2021-12-10 NOTE — Addendum Note (Signed)
Addended by: Mervin Kung A on: 12/10/2021 07:14 AM   Modules accepted: Orders

## 2021-12-16 ENCOUNTER — Telehealth (INDEPENDENT_AMBULATORY_CARE_PROVIDER_SITE_OTHER): Payer: No Typology Code available for payment source | Admitting: Family Medicine

## 2021-12-16 DIAGNOSIS — E103292 Type 1 diabetes mellitus with mild nonproliferative diabetic retinopathy without macular edema, left eye: Secondary | ICD-10-CM | POA: Diagnosis not present

## 2021-12-16 DIAGNOSIS — I10 Essential (primary) hypertension: Secondary | ICD-10-CM | POA: Diagnosis not present

## 2021-12-16 DIAGNOSIS — F418 Other specified anxiety disorders: Secondary | ICD-10-CM

## 2021-12-16 DIAGNOSIS — R109 Unspecified abdominal pain: Secondary | ICD-10-CM

## 2021-12-16 NOTE — Assessment & Plan Note (Signed)
hgba1c acceptable, minimize simple carbs. Increase exercise as tolerated. Continue current meds 

## 2021-12-16 NOTE — Assessment & Plan Note (Signed)
Good today but he notes it has frequently been in the 150s systolic he has just been able to shift his work schedule to normal day time hours just 2 weeks ago so will give him another month on a regular schedule to see if his numbers continue to improve. He will let us know if his numbers trend up or if he develops any concerning symptoms. He agrees to come in for flu shot. He will send [place] BP numbers in about a month

## 2021-12-16 NOTE — Assessment & Plan Note (Signed)
resolved 

## 2021-12-16 NOTE — Assessment & Plan Note (Signed)
Doing well with change in schedule, no new concerns.

## 2021-12-16 NOTE — Progress Notes (Signed)
MyChart Video Visit    Virtual Visit via Video Note   This visit type was conducted due to national recommendations for restrictions regarding the COVID-19 Pandemic (e.g. social distancing) in an effort to limit this patient's exposure and mitigate transmission in our community. This patient is at least at moderate risk for complications without adequate follow up. This format is felt to be most appropriate for this patient at this time. Physical exam was limited by quality of the video and audio technology used for the visit. Shamaine, CMA was able to get the patient set up on a video visit.  Patient location: home Patient and provider in visit Provider location: Office  I discussed the limitations of evaluation and management by telemedicine and the availability of in person appointments. The patient expressed understanding and agreed to proceed.  Visit Date: 12/16/2021  Today's healthcare provider: Penni Homans, MD     Subjective:    Patient ID: Scott Adkins, male    DOB: 1961/01/24, 61 y.o.   MRN: [id number]  No chief complaint on file.   HPI Patient is in today for follow up on chronic medical concerns. No recent febrile illness or acute hospitalizations. He is feeling better. He is taking his Hydralazine, Carvedilol and HCTZ as prescribed. His numbers have improved greatly but are not fully in the normal range. Highest systolic is [id number] and lowest 137, average in low 150s. Pulse and diastolic pressure have been in normal range. He feels well. Sugars have been well controlled and he has no more severe hypoglycemic numbers. His numbers have been normal and his sugar this morning was 106. Denies CP/palp/SOB/HA/congestion/fevers/GI or GU c/o. Taking meds as prescribed   Past Medical History:  Diagnosis Date   Anemia 12/22/2015   Arthritis    Chicken pox as a child   Constipation 12/22/2016   Diabetes mellitus type I ([place])    uses insulin pump// managed by Dr. Legrand Como Altheimer    DM (diabetes mellitus), type 1 with ophthalmic complications ([place]) 99991111   Hyperlipidemia    Hypertension    Injury of right rotator cuff 08/16/2012   Has been seen by [facility]   Left flank pain 09/12/2015   Mumps as a child   Pain in joint, lower leg 01/24/2016   Preventative health care 02/19/2013   Sun-damaged skin 12/06/2015    Past Surgical History:  Procedure Laterality Date   insulin pump     placed and removed   REFRACTIVE SURGERY Right 2007   [place]  2004   left    Family History  Problem Relation Age of Onset   Hypertension Mother    Stroke Father    Hypertension Father    Hyperlipidemia Father    Hypertension Brother    Hyperlipidemia Brother    Mental illness Brother        depression   Heart disease Maternal Grandmother        MI   Heart disease Maternal Grandfather        MI   Hyperlipidemia Brother    Hypertension Brother    Colon cancer Neg Hx    Prostate cancer Neg Hx    Breast cancer Neg Hx    Rectal cancer Neg Hx    Diabetes Neg Hx     Social History   Socioeconomic History   Marital status: Married    Spouse name: Not on file   Number of children: 3  Years of education: Not on file   Highest education level: Not on file  Occupational History   Occupation: welder  Tobacco Use   Smoking status: Never   Smokeless tobacco: Never  Substance and Sexual Activity   Alcohol use: No    Alcohol/week: 1.0 standard drink of alcohol    Types: 1 Shots of liquor per week   Drug use: No   Sexual activity: Not on file    Comment: lives with wife and mother in law, no dietary restrictions  Other Topics Concern   Not on file  Social History Narrative   Married 37 yrs   2 daughters; 1 son   Occupation: Building control surveyor      Social Determinants of Radio broadcast assistant Strain: Not on file  Food Insecurity: Not on file  Transportation Needs: Not on file  Physical Activity: Not on file  Stress: Not on file  Social  Connections: Not on file  Intimate Partner Violence: Not on file    Outpatient Medications Prior to Visit  Medication Sig Dispense Refill   aspirin 81 MG tablet Take 81 mg by mouth daily.     atorvastatin (LIPITOR) 20 MG tablet Take 1 tablet by mouth once daily 90 tablet 0   carvedilol (COREG) 12.5 MG tablet Take 1 tablet (12.5 mg total) by mouth 2 (two) times daily with a meal. 60 tablet 3   Cholecalciferol (VITAMIN D3) 1000 UNITS CAPS Take 2,000 Units by mouth daily.     fluticasone (FLONASE) 50 MCG/ACT nasal spray Place 2 sprays into both nostrils daily. 16 g 1   hydrALAZINE (APRESOLINE) 25 MG tablet Take 2 tablets (50 mg total) by mouth 3 (three) times daily. 270 tablet 1   hydrochlorothiazide (HYDRODIURIL) 25 MG tablet Take 1 tablet (25 mg total) by mouth daily. 30 tablet 3   insulin detemir (LEVEMIR) 100 UNIT/ML injection INJECT 12 UNITS SUBCUTANEOUSLY AT BEDTIME 10 mL 1   NOVOLOG 100 UNIT/ML injection Inject 4 Units into the skin 2 (two) times daily with a meal. 40 mL 0   sertraline (ZOLOFT) 50 MG tablet Take 1 tablet by mouth once daily 90 tablet 0   predniSONE (DELTASONE) 10 MG tablet 4 tabs by mouth once daily for 2 days, then 3 tabs daily x 2 days, then 2 tabs daily x 2 days, then 1 tab daily x 2 days 20 tablet 0   No facility-administered medications prior to visit.    Allergies  Allergen Reactions   Humalog [Insulin Lispro] Other (See Comments)    Sugar drops dangerously low    Lisinopril     Hyperkalemia    Viagra [Sildenafil Citrate] Other (See Comments)    headache    Review of Systems  Constitutional:  Negative for fever and malaise/fatigue.  HENT:  Negative for congestion.   Eyes:  Negative for blurred vision.  Respiratory:  Negative for shortness of breath.   Cardiovascular:  Negative for chest pain, palpitations and leg swelling.  Gastrointestinal:  Negative for abdominal pain, blood in stool and nausea.  Genitourinary:  Negative for dysuria and frequency.   Musculoskeletal:  Negative for falls.  Skin:  Negative for rash.  Neurological:  Negative for dizziness, loss of consciousness and headaches.  Endo/Heme/Allergies:  Negative for environmental allergies.  Psychiatric/Behavioral:  Negative for depression. The patient is not nervous/anxious.        Objective:    Physical Exam Constitutional:      General: He is not in acute distress.  Appearance: Normal appearance. He is not ill-appearing or toxic-appearing.  HENT:     Head: Normocephalic and atraumatic.     Right Ear: External ear normal.     Left Ear: External ear normal.     Nose: Nose normal.  Eyes:     General:        Right eye: No discharge.        Left eye: No discharge.  Pulmonary:     Effort: Pulmonary effort is normal.  Skin:    Findings: No rash.  Neurological:     Mental Status: He is alert and oriented to person, place, and time.  Psychiatric:        Behavior: Behavior normal.     BP 137/87   Pulse 62  Wt Readings from Last 3 Encounters:  11/14/21 199 lb (90.3 kg)  11/11/21 200 lb 9.6 oz (91 kg)  10/17/21 202 lb (91.6 kg)    Diabetic Foot Exam - Simple   No data filed    Lab Results  Component Value Date   WBC 5.9 12/10/2021   HGB 13.4 12/10/2021   HCT 41.2 12/10/2021   PLT 161.0 12/10/2021   GLUCOSE 145 (H) 11/11/2021   CHOL 163 05/12/2021   TRIG 82.0 05/12/2021   HDL 54.50 05/12/2021   LDLDIRECT 71 09/10/2008   LDLCALC 92 05/12/2021   ALT 22 11/11/2021   AST 20 11/11/2021   NA 140 11/11/2021   K 4.8 11/11/2021   CL 100 11/11/2021   CREATININE 1.01 11/11/2021   BUN 29 (H) 11/11/2021   CO2 32 11/11/2021   TSH 2.64 05/12/2021   PSA 0.40 05/12/2021   HGBA1C 7.2 (H) 10/20/2021   MICROALBUR 1.2 05/12/2021    Lab Results  Component Value Date   TSH 2.64 05/12/2021   Lab Results  Component Value Date   WBC 5.9 12/10/2021   HGB 13.4 12/10/2021   HCT 41.2 12/10/2021   MCV 90.7 12/10/2021   PLT 161.0 12/10/2021   Lab Results   Component Value Date   NA 140 11/11/2021   K 4.8 11/11/2021   CO2 32 11/11/2021   GLUCOSE 145 (H) 11/11/2021   BUN 29 (H) 11/11/2021   CREATININE 1.01 11/11/2021   BILITOT 0.5 11/11/2021   ALKPHOS 78 11/11/2021   AST 20 11/11/2021   ALT 22 11/11/2021   PROT 6.4 11/11/2021   ALBUMIN 4.0 11/11/2021   CALCIUM 9.4 11/11/2021   GFR 80.35 11/11/2021   Lab Results  Component Value Date   CHOL 163 05/12/2021   Lab Results  Component Value Date   HDL 54.50 05/12/2021   Lab Results  Component Value Date   LDLCALC 92 05/12/2021   Lab Results  Component Value Date   TRIG 82.0 05/12/2021   Lab Results  Component Value Date   CHOLHDL 3 05/12/2021   Lab Results  Component Value Date   HGBA1C 7.2 (H) 10/20/2021       Assessment & Plan:   Problem List Items Addressed This Visit     Depression with anxiety    Doing well with change in schedule, no new concerns.       Essential hypertension    Good today but he notes it has frequently been in the 150s systolic he has just been able to shift his work schedule to normal day time hours just 2 weeks ago so will give him another month on a regular schedule to see if his numbers continue to improve. He will  let us know if his numbers trend up or if he develops any concerning symptoms. He agrees to come in for flu shot. He will send [place] BP numbers in about a month      DM (diabetes mellitus), type 1 with ophthalmic complications (H[facility])    hgba1c acceptable, minimize simple carbs. Increase exercise as tolerated. Continue current meds      Right flank pain    resolved       I have discontinued Kyra Leyland "Ron"'s predniSONE. I am also having him maintain his Vitamin D3, aspirin, fluticasone, insulin detemir, hydrochlorothiazide, sertraline, carvedilol, atorvastatin, hydrALAZINE, and NovoLOG.  No orders of the defined types were placed in this encounter.   I discussed the assessment and treatment plan with the patient. The  patient was provided an opportunity to ask questions and all were answered. The patient agreed with the plan and demonstrated an understanding of the instructions.   The patient was advised to call back or seek an in-person evaluation if the symptoms worsen or if the condition fails to improve as anticipated.   Danise Edge, MD [facility] [phone] (phone) [phone] (fax)  [facility]

## 2022-01-04 ENCOUNTER — Other Ambulatory Visit: Payer: Self-pay | Admitting: Family Medicine

## 2022-01-09 ENCOUNTER — Encounter: Payer: Self-pay | Admitting: Family

## 2022-01-09 ENCOUNTER — Other Ambulatory Visit: Payer: Self-pay | Admitting: Family

## 2022-01-09 ENCOUNTER — Ambulatory Visit: Payer: No Typology Code available for payment source | Admitting: Family

## 2022-01-09 VITALS — BP 160/70 | HR 66 | Temp 98.5°F | Ht 67.0 in | Wt 195.0 lb

## 2022-01-09 DIAGNOSIS — E162 Hypoglycemia, unspecified: Secondary | ICD-10-CM

## 2022-01-09 NOTE — Progress Notes (Signed)
Scott Adkins is a 61 y.o. male with the following history as recorded in EpicCare:  Patient Active Problem List   Diagnosis Date Noted   Decreased visual acuity 01/15/2021   Peripheral neuropathy 09/08/2018   Insomnia 01/16/2018   Hyperkalemia 01/16/2018   Constipation 12/22/2016   Pain in joint, lower leg 01/24/2016   Anemia 12/22/2015   Sun-damaged skin 12/06/2015   Right flank pain 09/12/2015   Muscle cramping 11/24/2014   DM (diabetes mellitus), type 1 with ophthalmic complications ([place]) 04/08/7251   Back pain without radiation 09/05/2013   Low libido 08/14/2013   Chicken pox    Mumps    Preventative health care 02/19/2013   Injury of right rotator cuff 08/16/2012   Asthma 06/01/2012   Paresthesia 02/17/2012   CONTACT DERMATITIS 10/01/2008   Depression with anxiety 09/17/2008   Hyperlipidemia 07/02/2008   Essential hypertension 07/02/2008   ALLERGIC RHINITIS 07/02/2008    Current Outpatient Medications  Medication Sig Dispense Refill   aspirin 81 MG tablet Take 81 mg by mouth daily.     atorvastatin (LIPITOR) 20 MG tablet Take 1 tablet by mouth once daily 90 tablet 0   carvedilol (COREG) 12.5 MG tablet Take 1 tablet (12.5 mg total) by mouth 2 (two) times daily with a meal. 60 tablet 3   Cholecalciferol (VITAMIN D3) 1000 UNITS CAPS Take 2,000 Units by mouth daily.     fluticasone (FLONASE) 50 MCG/ACT nasal spray Place 2 sprays into both nostrils daily. 16 g 1   hydrALAZINE (APRESOLINE) 25 MG tablet Take 2 tablets (50 mg total) by mouth 3 (three) times daily. 270 tablet 1   hydrochlorothiazide (HYDRODIURIL) 25 MG tablet Take 1 tablet (25 mg total) by mouth daily. 30 tablet 3   insulin detemir (LEVEMIR) 100 UNIT/ML injection INJECT 12 UNITS SUBCUTANEOUSLY AT BEDTIME 10 mL 1   NOVOLOG 100 UNIT/ML injection Inject 4 Units into the skin 2 (two) times daily with a meal. 40 mL 0   sertraline (ZOLOFT) 50 MG tablet Take 1 tablet by mouth once daily 90 tablet 0   No current  facility-administered medications for this visit.    Allergies: Humalog [insulin lispro], Lisinopril, and Viagra [sildenafil citrate]  Past Medical History:  Diagnosis Date   Anemia 12/22/2015   Arthritis    Chicken pox as a child   Constipation 12/22/2016   Diabetes mellitus type I ([place])    uses insulin pump// managed by Dr. Legrand Como Altheimer   DM (diabetes mellitus), type 1 with ophthalmic complications ([place]) 09/09/4401   Hyperlipidemia    Hypertension    Injury of right rotator cuff 08/16/2012   Has been seen by [facility]   Left flank pain 09/12/2015   Mumps as a child   Pain in joint, lower leg 01/24/2016   Preventative health care 02/19/2013   Sun-damaged skin 12/06/2015    Past Surgical History:  Procedure Laterality Date   insulin pump     placed and removed   REFRACTIVE SURGERY Right 2007   [place]  2004   left    Family History  Problem Relation Age of Onset   Hypertension Mother    Stroke Father    Hypertension Father    Hyperlipidemia Father    Hypertension Brother    Hyperlipidemia Brother    Mental illness Brother        depression   Heart disease Maternal Grandmother        MI   Heart  disease Maternal Grandfather        MI   Hyperlipidemia Brother    Hypertension Brother    Colon cancer Neg Hx    Prostate cancer Neg Hx    Breast cancer Neg Hx    Rectal cancer Neg Hx    Diabetes Neg Hx     Social History   Tobacco Use   Smoking status: Never   Smokeless tobacco: Never  Substance Use Topics   Alcohol use: No    Alcohol/week: 1.0 standard drink of alcohol    Types: 1 Shots of liquor per week    Subjective:   Notes that yesterday woke up "not feeling well"; opted to stay home from work to rest; did have an episode of hypoglycemia early this morning- wife checked on patient and his blood sugar was at 35; ate honey sticks and notes that blood sugar was up to 277 by 6 am; notes that as day has progressed, he has felt better and  blood sugar was averaging 110;      Objective:  Vitals:   01/09/22 1529  BP: (!) 160/70  Pulse: 66  Temp: 98.5 F (36.9 C)  TempSrc: Oral  SpO2: 99%  Weight: 195 lb (88.5 kg)  Height: _0  (1.702 m)    General: Well developed, well nourished, in no acute distress  Skin : Warm and dry.  Head: Normocephalic and atraumatic  Eyes: Sclera and conjunctiva clear; pupils round and reactive to light; extraocular movements intact  Ears: External normal; canals clear; tympanic membranes normal  Oropharynx: Pink, supple. No suspicious lesions  Neck: Supple without thyromegaly, adenopathy  Lungs: Respirations unlabored; clear to auscultation bilaterally without wheeze, rales, rhonchi  CVS exam: normal rate and regular rhythm.  Neurologic: Alert and oriented; speech intact; face symmetrical; moves all extremities well; CNII-XII intact without focal deficit   Assessment:  1. Hypoglycemia     Plan:  Work note given for yesterday and today as requested; will check CBC, CMP today; will reach out to our pharmacist to try and get CGM approved for patient.  Encouraged to schedule for follow up with his PCP by early January 2024;   No follow-ups on file.  Orders Placed This Encounter  Procedures   CBC with Differential/Platelet   Comp Met (CMET)    Requested Prescriptions    No prescriptions requested or ordered in this encounter

## 2022-01-10 LAB — COMPREHENSIVE METABOLIC PANEL
AG Ratio: 1.5 (calc) (ref 1.0–2.5)
ALT: 23 U/L (ref 9–46)
AST: 30 U/L (ref 10–35)
Albumin: 4 g/dL (ref 3.6–5.1)
Alkaline phosphatase (APISO): 78 U/L (ref 35–144)
BUN: 20 mg/dL (ref 7–25)
CO2: 27 mmol/L (ref 20–32)
Calcium: 9.2 mg/dL (ref 8.6–10.3)
Chloride: 105 mmol/L (ref 98–110)
Creat: 1 mg/dL (ref 0.70–1.35)
Globulin: 2.7 g/dL (calc) (ref 1.9–3.7)
Glucose, Bld: 35 mg/dL — CL (ref 65–99)
Potassium: 4.1 mmol/L (ref 3.5–5.3)
Sodium: 143 mmol/L (ref 135–146)
Total Bilirubin: 0.4 mg/dL (ref 0.2–1.2)
Total Protein: 6.7 g/dL (ref 6.1–8.1)

## 2022-01-10 LAB — CBC WITH DIFFERENTIAL/PLATELET
Absolute Monocytes: 1049 cells/uL — ABNORMAL HIGH (ref 200–950)
Basophils Absolute: 83 cells/uL (ref 0–200)
Basophils Relative: 0.9 %
Eosinophils Absolute: 129 cells/uL (ref 15–500)
Eosinophils Relative: 1.4 %
HCT: 41.4 % (ref 38.5–50.0)
Hemoglobin: 13.6 g/dL (ref 13.2–17.1)
Lymphs Abs: 1684 cells/uL (ref 850–3900)
MCH: 29.6 pg (ref 27.0–33.0)
MCHC: 32.9 g/dL (ref 32.0–36.0)
MCV: 90.2 fL (ref 80.0–100.0)
MPV: 10.7 fL (ref 7.5–12.5)
Monocytes Relative: 11.4 %
Neutro Abs: 6256 cells/uL (ref 1500–7800)
Neutrophils Relative %: 68 %
Platelets: 194 10*3/uL (ref 140–400)
RBC: 4.59 10*6/uL (ref 4.20–5.80)
RDW: 10.9 % — ABNORMAL LOW (ref 11.0–15.0)
Total Lymphocyte: 18.3 %
WBC: 9.2 10*3/uL (ref 3.8–10.8)

## 2022-01-12 ENCOUNTER — Encounter: Payer: Self-pay | Admitting: Family Medicine

## 2022-01-12 ENCOUNTER — Telehealth: Payer: Self-pay | Admitting: Family Medicine

## 2022-01-12 ENCOUNTER — Telehealth: Payer: Self-pay

## 2022-01-12 NOTE — Telephone Encounter (Signed)
[facility] called to report critical labs to a CMA, but none were available. They stated they would fax the labs to [place]. Labs were ordered by Mickel Baas.

## 2022-01-12 NOTE — Telephone Encounter (Signed)
Scott Adkins called-we got a critical low blood sugar from 3 days ago.  Patient reports his blood sugar is currently normal Hypoglycemia may have been due to blood processing time

## 2022-01-13 ENCOUNTER — Telehealth: Payer: Self-pay | Admitting: Pharmacist

## 2022-01-13 NOTE — Telephone Encounter (Signed)
I see that he is taking basal and bolus insulin and has had 2 documented hypoglycemic event < 50.  Called patient to discuss options. Patient has high deductible medical plan - $6000 deductible to meet.  It might be cheaper to use [employer] to get for abound $80 per month.  Will check with his insurance for cost.

## 2022-01-13 NOTE — Telephone Encounter (Signed)
-----   Message from Marrian Salvage, [place] sent at 01/13/2022  4:20 PM EDT ----- This gentleman said that Dr. Charlett Blake had not been able to get CGM approved for him. If I refer him to your schedule, would you have time to meet with him and discuss options? You seem to be able to make it happen when we fail.  ----- Message ----- From: Interface, [facility] Results In Sent: 01/09/2022  10:59 PM EDT To: Marrian Salvage, FNP

## 2022-01-13 NOTE — Telephone Encounter (Signed)
Critical lab was received by doc of day. We have contacted the pt and he stated that he was doing well. He will give [place] a call to follow up if he is not feeling well.

## 2022-01-14 ENCOUNTER — Ambulatory Visit (INDEPENDENT_AMBULATORY_CARE_PROVIDER_SITE_OTHER): Payer: No Typology Code available for payment source | Admitting: Pharmacist

## 2022-01-14 DIAGNOSIS — E103292 Type 1 diabetes mellitus with mild nonproliferative diabetic retinopathy without macular edema, left eye: Secondary | ICD-10-CM

## 2022-01-14 DIAGNOSIS — E162 Hypoglycemia, unspecified: Secondary | ICD-10-CM

## 2022-01-14 MED ORDER — DEXCOM G7 SENSOR MISC: 2 refills | Status: DC

## 2022-01-14 NOTE — Progress Notes (Addendum)
Pharmacy Note  01/14/2022 Name: Scott Adkins MRN: [id number] DOB: 1960-12-20  Subjective: Scott Adkins is a 61 y.o. year old male who is a primary care patient of Mosie Lukes, MD. Clinical Pharmacist Practitioner referral was placed to assist with getting Continuous Glucose Monitor approved thru insurance. Once approved Clinical Pharmacist Practitioner to initiate and provide education on Continuous Glucose Monitor.     Engaged with patient face to face for initial visit today.  Patient has been having frequent hypoglycemia events. Recently had low that lead to a seizure.   Diagnosed with type 1 DM at 61 yo (43 year ago)   Current therapy for DM:  Levemir 12 units at bedtime Novolog takes prior to meals - 3 units with breakfast (5am), 3 units with Lunch (11am) 10 to 15 units with evening meal (depends on how many carbohydrates in meal and if need to correct blood glucose)   Past therapies:  Humalog - drops blood glucose too much Insulin pump - wouldn't stay on.  Mix 70/30 insulin  - fixed mixture caused inconsistent blood glucose and lots of lows.  Objective: Review of patient status, including review of consultants reports, laboratory and other test data, was performed as part of comprehensive evaluation and provision of chronic care management services.   Lab Results  Component Value Date   CREATININE 1.00 01/09/2022   CREATININE 1.01 11/11/2021   CREATININE 1.02 10/17/2021    Lab Results  Component Value Date   HGBA1C 7.2 (H) 10/20/2021       Component Value Date/Time   CHOL 163 05/12/2021 1111   TRIG 82.0 05/12/2021 1111   TRIG 51 09/10/2008 0000   HDL 54.50 05/12/2021 1111   CHOLHDL 3 05/12/2021 1111   VLDL 16.4 05/12/2021 1111   LDLCALC 92 05/12/2021 1111   LDLDIRECT 71 09/10/2008 0000     Clinical ASCVD: No  The 10-year ASCVD risk score (Arnett DK, et al., 2019) is: 22.9%   Values used to calculate the score:     Age: 26 years     Sex: Male      Is Non-Hispanic African American: No     Diabetic: Yes     Tobacco smoker: No     Systolic Blood Pressure: 742 mmHg     Is BP treated: Yes     HDL Cholesterol: 54.5 mg/dL     Total Cholesterol: 163 mg/dL    BP Readings from Last 3 Encounters:  01/09/22 (!) 160/70  12/16/21 137/87  11/14/21 (!) 133/57     Allergies  Allergen Reactions   Humalog [Insulin Lispro] Other (See Comments)    Sugar drops dangerously low    Lisinopril     Hyperkalemia    Viagra [Sildenafil Citrate] Other (See Comments)    headache    Medications Reviewed Today     Reviewed by Cherre Robins, RPH-CPP (Pharmacist) on 01/14/22 at 1335  Med List Status: <None>   Medication Order Taking? Sig Documenting Provider Last Dose Status Informant  aspirin 81 MG tablet [id number]  Take 81 mg by mouth daily. [provider]  Active Self  atorvastatin (LIPITOR) 20 MG tablet [id number]  Take 1 tablet by mouth once daily Mosie Lukes, MD  Active   carvedilol (COREG) 12.5 MG tablet [id number]  Take 1 tablet (12.5 mg total) by mouth 2 (two) times daily with a meal. Debbrah Alar, NP  Active   Cholecalciferol (VITAMIN D3) 1000 UNITS CAPS [id number]  Take 2,000 Units by mouth daily.  [provider]  Active Self           Med Note Lesia Sago Aug 21, 2019  8:05 AM)    fluticasone (FLONASE) 50 MCG/ACT nasal spray [id number]  Place 2 sprays into both nostrils daily. Marrian Salvage, FNP  Active   hydrALAZINE (APRESOLINE) 25 MG tablet [id number]  Take 2 tablets (50 mg total) by mouth 3 (three) times daily. Debbrah Alar, NP  Active   hydrochlorothiazide (HYDRODIURIL) 25 MG tablet [id number]  Take 1 tablet (25 mg total) by mouth daily. Debbrah Alar, NP  Active   insulin detemir (LEVEMIR) 100 UNIT/ML injection [id number]  INJECT 12 UNITS SUBCUTANEOUSLY AT BEDTIME Mosie Lukes, MD  Active   NOVOLOG 100 UNIT/ML injection [id number]  Inject 4 Units into the skin 2 (two) times  daily with a meal. Mosie Lukes, MD  Active   sertraline (ZOLOFT) 50 MG tablet [id number]  Take 1 tablet by mouth once daily Mosie Lukes, MD  Active             Patient Active Problem List   Diagnosis Date Noted   Decreased visual acuity 01/15/2021   Peripheral neuropathy 09/08/2018   Insomnia 01/16/2018   Hyperkalemia 01/16/2018   Constipation 12/22/2016   Pain in joint, lower leg 01/24/2016   Anemia 12/22/2015   Sun-damaged skin 12/06/2015   Right flank pain 09/12/2015   Muscle cramping 11/24/2014   DM (diabetes mellitus), type 1 with ophthalmic complications ([place]) 0000000   Back pain without radiation 09/05/2013   Low libido 08/14/2013   Chicken pox    Mumps    Preventative health care 02/19/2013   Injury of right rotator cuff 08/16/2012   Asthma 06/01/2012   Paresthesia 02/17/2012   CONTACT DERMATITIS 10/01/2008   Depression with anxiety 09/17/2008   Hyperlipidemia 07/02/2008   Essential hypertension 07/02/2008   ALLERGIC RHINITIS 07/02/2008     Medication Assistance:  None required.  Patient affirms current coverage meets needs.   Assessment / Plan Type 1 DM with high incidence of severe hypoglycemia.  Patient received the following instruction for Dexcom 7 Continuous Glucose Monitor:  -  Assisted with downloading DexCom 7 app to patient's phone. Helped with registering and set up of app. Discussed preparation of placement site - clean with alcohol and allow to dry.  Sensor is to  only be place on back of upper arm.  Patient to rotate sides and site.  prepped sensor and taught how to apply sensor. Discussed care of sensor and site  reminded that sensor is waterproof  taught how to scan sensor and evaluted BG reading on reader reviewed reader screen. Discussed how blood glucose will show in app. Reviewed what trend arrows indicate and that graph at bottom of screen will provide history of blood glucose. Discussed alarms and when it os recommended to  confirm blood glucose with finger stick before making any treatment decisions. If there is no blood glucose number showing or if there is no trend arrow present.    Reviewed how to charge sensor Printed invitation and code for patient to use on DexCom clarity to connect with our practice.    Follow Up:  Telephone follow up appointment with care management team member scheduled for:  3 to 5 days.   Cherre Robins, PharmD Clinical Pharmacist [facility] [place] [phone]

## 2022-01-16 ENCOUNTER — Ambulatory Visit (INDEPENDENT_AMBULATORY_CARE_PROVIDER_SITE_OTHER): Payer: No Typology Code available for payment source | Admitting: Pharmacist

## 2022-01-16 DIAGNOSIS — E162 Hypoglycemia, unspecified: Secondary | ICD-10-CM

## 2022-01-16 DIAGNOSIS — E103292 Type 1 diabetes mellitus with mild nonproliferative diabetic retinopathy without macular edema, left eye: Secondary | ICD-10-CM

## 2022-01-16 MED ORDER — DEXCOM G7 RECEIVER DEVI
0 refills | Status: DC
Start: 2022-01-16 — End: 2022-11-15

## 2022-01-16 NOTE — Progress Notes (Signed)
Pharmacy Note  01/16/2022 Name: Scott Adkins MRN: [id number] DOB: [birth year]  Subjective: Scott Adkins is a 61 y.o. year old male who is a primary care patient of Scott Lukes, MD. Clinical Pharmacist Practitioner referral was placed to assist with getting Continuous Glucose Monitor approved thru insurance. Once approved Clinical Pharmacist Practitioner to initiate and provide education on Continuous Glucose Monitor.     Engaged with patient face to face for initial visit today.  Patient has been having frequent hypoglycemia events. Recently had low that lead to a seizure. Started DexCom 7 sensors 2 days ago. Education provided in office. Patient reports he has had 2 lows that he was able to catch fairly quickly and correct.  He asks if he can get a receiver / reader to have as a backup incase something happens with his phone.   Diagnosed with type 1 DM at 61 yo (43 year ago)   Current therapy for DM:  Levemir 12 units at bedtime Novolog takes prior to meals - 3 units with breakfast (5am), 3 units with Lunch (11am) 10 to 15 units with evening meal (depends on how many carbohydrates in meal and if need to correct blood glucose)   Past therapies:  Humalog - drops blood glucose too much Insulin pump - wouldn't stay on.  Mix 70/30 insulin  - fixed mixture caused inconsistent blood glucose and lots of lows.  Objective: Review of patient status, including review of consultants reports, laboratory and other test data, was performed as part of comprehensive evaluation and provision of chronic care management services.   Lab Results  Component Value Date   CREATININE 1.00 01/09/2022   CREATININE 1.01 11/11/2021   CREATININE 1.02 10/17/2021    Lab Results  Component Value Date   HGBA1C 7.2 (H) 10/20/2021       Component Value Date/Time   CHOL 163 05/12/2021 1111   TRIG 82.0 05/12/2021 1111   TRIG 51 09/10/2008 0000   HDL 54.50 05/12/2021 1111   CHOLHDL 3 05/12/2021 1111    VLDL 16.4 05/12/2021 1111   LDLCALC 92 05/12/2021 1111   LDLDIRECT 71 09/10/2008 0000     Clinical ASCVD: No  The 10-year ASCVD risk score (Arnett DK, et al., 2019) is: 22.9%   Values used to calculate the score:     Age: 21 years     Sex: Male     Is Non-Hispanic African American: No     Diabetic: Yes     Tobacco smoker: No     Systolic Blood Pressure: 564 mmHg     Is BP treated: Yes     HDL Cholesterol: 54.5 mg/dL     Total Cholesterol: 163 mg/dL    BP Readings from Last 3 Encounters:  01/09/22 (!) 160/70  12/16/21 137/87  11/14/21 (!) 133/57     Allergies  Allergen Reactions   Humalog [Insulin Lispro] Other (See Comments)    Sugar drops dangerously low    Lisinopril     Hyperkalemia    Viagra [Sildenafil Citrate] Other (See Comments)    headache    Medications Reviewed Today     Reviewed by Cherre Robins, RPH-CPP (Pharmacist) on 01/14/22 at 1335  Med List Status: <None>   Medication Order Taking? Sig Documenting Provider Last Dose Status Informant  aspirin 81 MG tablet [id number]  Take 81 mg by mouth daily. [provider]  Active Self  atorvastatin (LIPITOR) 20 MG tablet [id number]  Take 1 tablet by mouth once daily Charlett Blake,  Bryon Lions, MD  Active   carvedilol (COREG) 12.5 MG tablet [id number]  Take 1 tablet (12.5 mg total) by mouth 2 (two) times daily with a meal. [name], NP  Active   Cholecalciferol (VITAMIN D3) 1000 UNITS CAPS [id number]  Take 2,000 Units by mouth daily. [provider]  Active Self           Med Note Rosina Lowenstein Aug 21, 2019  8:05 AM)    fluticasone (FLONASE) 50 MCG/ACT nasal spray [id number]  Place 2 sprays into both nostrils daily. Olive Bass, FNP  Active   hydrALAZINE (APRESOLINE) 25 MG tablet [id number]  Take 2 tablets (50 mg total) by mouth 3 (three) times daily. [name], NP  Active   hydrochlorothiazide (HYDRODIURIL) 25 MG tablet [id number]  Take 1 tablet (25 mg total) by mouth  daily. [name], NP  Active   insulin detemir (LEVEMIR) 100 UNIT/ML injection [id number]  INJECT 12 UNITS SUBCUTANEOUSLY AT BEDTIME Bradd Canary, MD  Active   NOVOLOG 100 UNIT/ML injection [id number]  Inject 4 Units into the skin 2 (two) times daily with a meal. Bradd Canary, MD  Active   sertraline (ZOLOFT) 50 MG tablet [id number]  Take 1 tablet by mouth once daily Bradd Canary, MD  Active             Patient Active Problem List   Diagnosis Date Noted   Decreased visual acuity 01/15/2021   Peripheral neuropathy 09/08/2018   Insomnia 01/16/2018   Hyperkalemia 01/16/2018   Constipation 12/22/2016   Pain in joint, lower leg 01/24/2016   Anemia 12/22/2015   Sun-damaged skin 12/06/2015   Right flank pain 09/12/2015   Muscle cramping 11/24/2014   DM (diabetes mellitus), type 1 with ophthalmic complications (HCC) 09/05/2014   Back pain without radiation 09/05/2013   Low libido 08/14/2013   Chicken pox    Mumps    Preventative health care 02/19/2013   Injury of right rotator cuff 08/16/2012   Asthma 06/01/2012   Paresthesia 02/17/2012   CONTACT DERMATITIS 10/01/2008   Depression with anxiety 09/17/2008   Hyperlipidemia 07/02/2008   Essential hypertension 07/02/2008   ALLERGIC RHINITIS 07/02/2008     Medication Assistance:  None required.  Patient affirms current coverage meets needs.   Assessment / Plan Type 1 DM with high incidence of severe hypoglycemia.  Sent in prescription for DexCom 7 receiver / reader - should have prior authorization already - got for sensors and reader earlier this week.  Sent invitation to connect on [employer].   Follow Up:  Telephone follow up appointment with care management team member scheduled for:  5 to 7 days.   Henrene Pastor, PharmD Clinical Pharmacist [facility]  - [facility] [phone]

## 2022-01-20 ENCOUNTER — Encounter: Payer: Self-pay | Admitting: Family Medicine

## 2022-01-21 ENCOUNTER — Telehealth: Payer: Self-pay | Admitting: Family Medicine

## 2022-01-21 ENCOUNTER — Encounter: Payer: Self-pay | Admitting: Family Medicine

## 2022-01-21 MED ORDER — INSULIN DETEMIR 100 UNIT/ML ~~LOC~~ SOLN
SUBCUTANEOUS | 3 refills | Status: DC
Start: 2022-01-21 — End: 2022-07-27

## 2022-01-21 NOTE — Telephone Encounter (Signed)
PA initiated via Covermymeds; KEY: [id number]. Awaiting determination.

## 2022-01-22 ENCOUNTER — Encounter: Payer: Self-pay | Admitting: *Deleted

## 2022-01-22 NOTE — Telephone Encounter (Signed)
PA denied. Denial letter printed and gave to [facility].

## 2022-01-30 NOTE — Telephone Encounter (Signed)
Appeal was approved on 01/23/22 and is good for 12 months.

## 2022-02-16 ENCOUNTER — Other Ambulatory Visit: Payer: Self-pay | Admitting: Family Medicine

## 2022-03-18 ENCOUNTER — Other Ambulatory Visit: Payer: Self-pay | Admitting: Family Medicine

## 2022-03-18 DIAGNOSIS — E103292 Type 1 diabetes mellitus with mild nonproliferative diabetic retinopathy without macular edema, left eye: Secondary | ICD-10-CM

## 2022-03-19 ENCOUNTER — Other Ambulatory Visit: Payer: Self-pay | Admitting: Family Medicine

## 2022-03-19 DIAGNOSIS — E103292 Type 1 diabetes mellitus with mild nonproliferative diabetic retinopathy without macular edema, left eye: Secondary | ICD-10-CM

## 2022-03-23 NOTE — Telephone Encounter (Signed)
Approved on December 15 Request Reference Number: [id number]. NOVOLOG INJ 100/ML is approved through 03/21/2023. Your patient may now fill this prescription and it will be covered. Authorization Expiration Date: 03/21/2023

## 2022-03-26 ENCOUNTER — Encounter: Payer: Self-pay | Admitting: Family Medicine

## 2022-04-05 ENCOUNTER — Other Ambulatory Visit: Payer: Self-pay | Admitting: Family Medicine

## 2022-04-08 ENCOUNTER — Other Ambulatory Visit: Payer: Self-pay | Admitting: Family Medicine

## 2022-04-15 ENCOUNTER — Encounter: Payer: Self-pay | Admitting: Family Medicine

## 2022-04-20 ENCOUNTER — Ambulatory Visit: Payer: No Typology Code available for payment source | Admitting: Family Medicine

## 2022-04-20 VITALS — BP 160/74 | HR 50 | Temp 97.5°F | Resp 16 | Ht 67.0 in | Wt 193.0 lb

## 2022-04-20 DIAGNOSIS — I10 Essential (primary) hypertension: Secondary | ICD-10-CM

## 2022-04-20 DIAGNOSIS — E103292 Type 1 diabetes mellitus with mild nonproliferative diabetic retinopathy without macular edema, left eye: Secondary | ICD-10-CM | POA: Diagnosis not present

## 2022-04-20 DIAGNOSIS — E782 Mixed hyperlipidemia: Secondary | ICD-10-CM

## 2022-04-20 DIAGNOSIS — R252 Cramp and spasm: Secondary | ICD-10-CM

## 2022-04-20 MED ORDER — VALSARTAN 40 MG PO TABS: 40.0000 mg | ORAL_TABLET | Freq: Every day | ORAL | 3 refills | Status: DC

## 2022-04-20 MED ORDER — NOVOLOG 100 UNIT/ML IJ SOLN: 15.0000 [IU] | Freq: Four times a day (QID) | INTRAMUSCULAR | 5 refills | Status: DC | PRN

## 2022-04-20 NOTE — Patient Instructions (Signed)
RSV, Respiratory Syncitial Virus, Arexvy at pharmacy  Hypertension, Adult High blood pressure (hypertension) is when the force of blood pumping through the arteries is too strong. The arteries are the blood vessels that carry blood from the heart throughout the body. Hypertension forces the heart to work harder to pump blood and may cause arteries to become narrow or stiff. Untreated or uncontrolled hypertension can lead to a heart attack, heart failure, a stroke, kidney disease, and other problems. A blood pressure reading consists of a higher number over a lower number. Ideally, your blood pressure should be below 120/80. The first ("top") number is called the systolic pressure. It is a measure of the pressure in your arteries as your heart beats. The second ("bottom") number is called the diastolic pressure. It is a measure of the pressure in your arteries as the heart relaxes. What are the causes? The exact cause of this condition is not known. There are some conditions that result in high blood pressure. What increases the risk? Certain factors may make you more likely to develop high blood pressure. Some of these risk factors are under your control, including: Smoking. Not getting enough exercise or physical activity. Being overweight. Having too much fat, sugar, calories, or salt (sodium) in your diet. Drinking too much alcohol. Other risk factors include: Having a personal history of heart disease, diabetes, high cholesterol, or kidney disease. Stress. Having a family history of high blood pressure and high cholesterol. Having obstructive sleep apnea. Age. The risk increases with age. What are the signs or symptoms? High blood pressure may not cause symptoms. Very high blood pressure (hypertensive crisis) may cause: Headache. Fast or irregular heartbeats (palpitations). Shortness of breath. Nosebleed. Nausea and vomiting. Vision changes. Severe chest pain, dizziness, and  seizures. How is this diagnosed? This condition is diagnosed by measuring your blood pressure while you are seated, with your arm resting on a flat surface, your legs uncrossed, and your feet flat on the floor. The cuff of the blood pressure monitor will be placed directly against the skin of your upper arm at the level of your heart. Blood pressure should be measured at least twice using the same arm. Certain conditions can cause a difference in blood pressure between your right and left arms. If you have a high blood pressure reading during one visit or you have normal blood pressure with other risk factors, you may be asked to: Return on a different day to have your blood pressure checked again. Monitor your blood pressure at home for 1 week or longer. If you are diagnosed with hypertension, you may have other blood or imaging tests to help your health care provider understand your overall risk for other conditions. How is this treated? This condition is treated by making healthy lifestyle changes, such as eating healthy foods, exercising more, and reducing your alcohol intake. You may be referred for counseling on a healthy diet and physical activity. Your health care provider may prescribe medicine if lifestyle changes are not enough to get your blood pressure under control and if: Your systolic blood pressure is above 130. Your diastolic blood pressure is above 80. Your personal target blood pressure may vary depending on your medical conditions, your age, and other factors. Follow these instructions at home: Eating and drinking  Eat a diet that is high in fiber and potassium, and low in sodium, added sugar, and fat. An example of this eating plan is called the DASH diet. DASH stands for Dietary Approaches  to Stop Hypertension. To eat this way: Eat plenty of fresh fruits and vegetables. Try to fill one half of your plate at each meal with fruits and vegetables. Eat whole grains, such as  whole-wheat pasta, brown rice, or whole-grain bread. Fill about one fourth of your plate with whole grains. Eat or drink low-fat dairy products, such as skim milk or low-fat yogurt. Avoid fatty cuts of meat, processed or cured meats, and poultry with skin. Fill about one fourth of your plate with lean proteins, such as fish, chicken without skin, beans, eggs, or tofu. Avoid pre-made and processed foods. These tend to be higher in sodium, added sugar, and fat. Reduce your daily sodium intake. Many people with hypertension should eat less than 1,500 mg of sodium a day. Do not drink alcohol if: Your health care provider tells you not to drink. You are pregnant, may be pregnant, or are planning to become pregnant. If you drink alcohol: Limit how much you have to: 0-1 drink a day for women. 0-2 drinks a day for men. Know how much alcohol is in your drink. In the [place] one drink equals one 12 oz bottle of beer (355 mL), one 5 oz glass of wine (148 mL), or one 1 oz glass of hard liquor (44 mL). Lifestyle  Work with your health care provider to maintain a healthy body weight or to lose weight. Ask what an ideal weight is for you. Get at least 30 minutes of exercise that causes your heart to beat faster (aerobic exercise) most days of the week. Activities may include walking, swimming, or biking. Include exercise to strengthen your muscles (resistance exercise), such as Pilates or lifting weights, as part of your weekly exercise routine. Try to do these types of exercises for 30 minutes at least 3 days a week. Do not use any products that contain nicotine or tobacco. These products include cigarettes, chewing tobacco, and vaping devices, such as e-cigarettes. If you need help quitting, ask your health care provider. Monitor your blood pressure at home as told by your health care provider. Keep all follow-up visits. This is important. Medicines Take over-the-counter and prescription medicines only as  told by your health care provider. Follow directions carefully. Blood pressure medicines must be taken as prescribed. Do not skip doses of blood pressure medicine. Doing this puts you at risk for problems and can make the medicine less effective. Ask your health care provider about side effects or reactions to medicines that you should watch for. Contact a health care provider if you: Think you are having a reaction to a medicine you are taking. Have headaches that keep coming back (recurring). Feel dizzy. Have swelling in your ankles. Have trouble with your vision. Get help right away if you: Develop a severe headache or confusion. Have unusual weakness or numbness. Feel faint. Have severe pain in your chest or abdomen. Vomit repeatedly. Have trouble breathing. These symptoms may be an emergency. Get help right away. Call 911. Do not wait to see if the symptoms will go away. Do not drive yourself to the hospital. Summary Hypertension is when the force of blood pumping through your arteries is too strong. If this condition is not controlled, it may put you at risk for serious complications. Your personal target blood pressure may vary depending on your medical conditions, your age, and other factors. For most people, a normal blood pressure is less than 120/80. Hypertension is treated with lifestyle changes, medicines, or a combination of both.  Lifestyle changes include losing weight, eating a healthy, low-sodium diet, exercising more, and limiting alcohol. This information is not intended to replace advice given to you by your health care provider. Make sure you discuss any questions you have with your health care provider. Document Revised: 01/28/2021 Document Reviewed: 01/28/2021 Elsevier Patient Education  [employer].

## 2022-04-20 NOTE — Telephone Encounter (Signed)
Appt today w/ PCP.  

## 2022-04-20 NOTE — Progress Notes (Signed)
Subjective:   By signing my name below, I, Scott Adkins, attest that this documentation has been prepared under the direction and in the presence of Scott Canary MD 04/20/2022   Patient ID: Scott Adkins, male    DOB: [birth year], 62 y.o.   MRN: [id number]  Chief Complaint  Patient presents with   Follow-up    Follow up    HPI Patient is in today for follow up.   He has been monitoring his blood pressure at home and ranges from 130-170/60-74. He notes that when his blood pressure is high, he feels very fatigued. He is compliant with hydrochlorothiazide. He has not been taking hydralizine or carvedilol.   He has been taking 12 units of levemir. He has been checking his blood sugar before he eats and tends to adjust his dose of Novolog depending on how high his blood sugar is. When his blood sugar is very high, he takes 20 units. We discussed a new dosage and decided to do 15 units of Novolog 4 times a day.   He drinks quite a bit of apple juice. He has quit drinking orange juice. He has been drinking a lot more water.   He is up to date on his tetanus, shingles, and pneumonia. We discussed the benefits of receiving the RSV vaccine.  He denies having any fever, new muscle pain, new joint pain, new moles, congestion, sinus pain, sore throat, chest pain, palpitations, cough, SOB, wheezing, n/v/d, constipation, blood in stool, dysuria, frequency, hematuria, or headaches at this time.  Past Medical History:  Diagnosis Date   Anemia 12/22/2015   Arthritis    Chicken pox as a child   Constipation 12/22/2016   Diabetes mellitus type I (HCC)    uses insulin pump// managed by Dr. 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/05/2014   Hyperlipidemia    Hypertension    Injury of right rotator cuff 08/16/2012   Has been seen by [facility]   Left flank pain 09/12/2015   Mumps as a child   Pain in joint, lower leg 01/24/2016   Preventative health care  02/19/2013   Sun-damaged skin 12/06/2015    Past Surgical History:  Procedure Laterality Date   insulin pump     placed and removed   REFRACTIVE SURGERY Right 2007   [facility]   ROTATOR CUFF REPAIR  2004   left    Family History  Problem Relation Age of Onset   Hypertension Mother    Stroke Father    Hypertension Father    Hyperlipidemia Father    Hypertension Brother    Hyperlipidemia Brother    Mental illness Brother        depression   Heart disease Maternal Grandmother        MI   Heart disease Maternal Grandfather        MI   Hyperlipidemia Brother    Hypertension Brother    Colon cancer Neg Hx    Prostate cancer Neg Hx    Breast cancer Neg Hx    Rectal cancer Neg Hx    Diabetes Neg Hx     Social History   Socioeconomic History   Marital status: Married    Spouse name: Not on file   Number of children: 3   Years of education: Not on file   Highest education level: Not on file  Occupational History   Occupation: welder  Tobacco Use  Smoking status: Never   Smokeless tobacco: Never  Substance and Sexual Activity   Alcohol use: No    Alcohol/week: 1.0 standard drink of alcohol    Types: 1 Shots of liquor per week   Drug use: No   Sexual activity: Not on file    Comment: lives with wife and mother in law, no dietary restrictions  Other Topics Concern   Not on file  Social History Narrative   Married 14 yrs   2 daughters; 1 son   Occupation: Psychologist, occupational      Social Determinants of Corporate investment banker Strain: Not on file  Food Insecurity: Not on file  Transportation Needs: Not on file  Physical Activity: Not on file  Stress: Not on file  Social Connections: Not on file  Intimate Partner Violence: Not on file    Outpatient Medications Prior to Visit  Medication Sig Dispense Refill   aspirin 81 MG tablet Take 81 mg by mouth daily.     atorvastatin (LIPITOR) 20 MG tablet Take 1 tablet (20 mg total) by mouth daily. 90 tablet 1    carvedilol (COREG) 12.5 MG tablet Take 1 tablet (12.5 mg total) by mouth 2 (two) times daily with a meal. 60 tablet 3   Cholecalciferol (VITAMIN D3) 1000 UNITS CAPS Take 2,000 Units by mouth daily.     Continuous Blood Gluc Receiver (DEXCOM G7 RECEIVER) DEVI Use with sensors to check blood glucose (back up or cell phone) 1 each 0   Continuous Blood Gluc Sensor (DEXCOM G7 SENSOR) MISC USE TO CHECK BLOOD GLUCOSE CONTINUOUSLY CHANGE SENSOR EVERY 10 DAYS 3 each 0   fluticasone (FLONASE) 50 MCG/ACT nasal spray Place 2 sprays into both nostrils daily. 16 g 1   hydrochlorothiazide (HYDRODIURIL) 25 MG tablet Take 1 tablet (25 mg total) by mouth daily. 30 tablet 3   insulin detemir (LEVEMIR) 100 UNIT/ML injection INJECT 12 UNITS SUBCUTANEOUSLY AT BEDTIME 10 mL 3   sertraline (ZOLOFT) 50 MG tablet Take 1 tablet by mouth once daily 90 tablet 0   NOVOLOG 100 UNIT/ML injection INJECT 4 UNITS SUBCUTANEOUSLY TWICE DAILY WITH A MEAL 40 mL PRN   hydrALAZINE (APRESOLINE) 25 MG tablet Take 2 tablets (50 mg total) by mouth 3 (three) times daily. (Patient not taking: Reported on 04/20/2022) 270 tablet 1   No facility-administered medications prior to visit.    Allergies  Allergen Reactions   Humalog [Insulin Lispro] Other (See Comments)    Sugar drops dangerously low    Lisinopril     Hyperkalemia    Viagra [Sildenafil Citrate] Other (See Comments)    headache    Review of Systems  Constitutional:  Positive for malaise/fatigue (when blood pressure is high).       Objective:    Physical Exam Constitutional:      General: He is not in acute distress.    Appearance: Normal appearance.  HENT:     Head: Normocephalic and atraumatic.     Right Ear: Tympanic membrane, ear canal and external ear normal.     Left Ear: Tympanic membrane, ear canal and external ear normal.  Eyes:     Extraocular Movements: Extraocular movements intact.     Pupils: Pupils are equal, round, and reactive to light.   Cardiovascular:     Rate and Rhythm: Normal rate and regular rhythm.     Pulses: Normal pulses.     Heart sounds: Normal heart sounds. No murmur heard.    No gallop.  Pulmonary:     Effort: Pulmonary effort is normal. No respiratory distress.     Breath sounds: Normal breath sounds. No wheezing.  Abdominal:     General: Abdomen is flat.     Palpations: Abdomen is soft. There is no mass.     Tenderness: There is no guarding.  Musculoskeletal:     Right lower leg: No edema.     Left lower leg: No edema.  Skin:    General: Skin is warm and dry.  Neurological:     General: No focal deficit present.     Mental Status: He is alert and oriented to person, place, and time.     Cranial Nerves: No cranial nerve deficit.     Coordination: Coordination normal.  Psychiatric:        Judgment: Judgment normal.     BP (!) 160/74 (BP Location: Left Arm, Patient Position: Sitting)   Pulse (!) 50   Temp (!) 97.5 F (36.4 C) (Oral)   Resp 16   Ht 5\' 7"  (1.702 m)   Wt 193 lb (87.5 kg)   SpO2 98%   BMI 30.23 kg/m  Wt Readings from Last 3 Encounters:  04/20/22 193 lb (87.5 kg)  01/09/22 195 lb (88.5 kg)  11/14/21 199 lb (90.3 kg)    Diabetic Foot Exam - Simple   No data filed    Lab Results  Component Value Date   WBC 8.5 04/20/2022   HGB 14.4 04/20/2022   HCT 43.2 04/20/2022   PLT 217.0 04/20/2022   GLUCOSE 109 (H) 04/20/2022   CHOL 170 04/20/2022   TRIG 97.0 04/20/2022   HDL 48.30 04/20/2022   LDLDIRECT 71 09/10/2008   LDLCALC 102 (H) 04/20/2022   ALT 19 04/20/2022   AST 17 04/20/2022   NA 141 04/20/2022   K 5.0 04/20/2022   CL 101 04/20/2022   CREATININE 0.97 04/20/2022   BUN 21 04/20/2022   CO2 31 04/20/2022   TSH 2.79 04/20/2022   PSA 0.40 05/12/2021   HGBA1C 7.8 (H) 04/20/2022   MICROALBUR 1.2 05/12/2021    Lab Results  Component Value Date   TSH 2.79 04/20/2022   Lab Results  Component Value Date   WBC 8.5 04/20/2022   HGB 14.4 04/20/2022   HCT 43.2  04/20/2022   MCV 88.2 04/20/2022   PLT 217.0 04/20/2022   Lab Results  Component Value Date   NA 141 04/20/2022   K 5.0 04/20/2022   CO2 31 04/20/2022   GLUCOSE 109 (H) 04/20/2022   BUN 21 04/20/2022   CREATININE 0.97 04/20/2022   BILITOT 0.5 04/20/2022   ALKPHOS 77 04/20/2022   AST 17 04/20/2022   ALT 19 04/20/2022   PROT 6.9 04/20/2022   ALBUMIN 4.2 04/20/2022   CALCIUM 9.4 04/20/2022   GFR 84.08 04/20/2022   Lab Results  Component Value Date   CHOL 170 04/20/2022   Lab Results  Component Value Date   HDL 48.30 04/20/2022   Lab Results  Component Value Date   LDLCALC 102 (H) 04/20/2022   Lab Results  Component Value Date   TRIG 97.0 04/20/2022   Lab Results  Component Value Date   CHOLHDL 4 04/20/2022   Lab Results  Component Value Date   HGBA1C 7.8 (H) 04/20/2022       Assessment & Plan:   Problem List Items Addressed This Visit     Hyperlipidemia    Tolerating statin, encouraged heart healthy diet, avoid trans fats, minimize simple  carbs and saturated fats. Increase exercise as tolerated      Relevant Medications   valsartan (DIOVAN) 40 MG tablet   Other Relevant Orders   Lipid panel (Completed)   Essential hypertension - Primary    Well controlled, no changes to meds. Encouraged heart healthy diet such as the DASH diet and exercise as tolerated.        Relevant Medications   valsartan (DIOVAN) 40 MG tablet   Other Relevant Orders   Hemoglobin A1c (Completed)   CBC with Differential/Platelet (Completed)   TSH (Completed)   DM (diabetes mellitus), type 1 with ophthalmic complications (HCC)    [id number] acceptable, minimize simple carbs. Increase exercise as tolerated. Continue current meds was given new prescription for Novolog.       Relevant Medications   NOVOLOG 100 UNIT/ML injection   valsartan (DIOVAN) 40 MG tablet   Other Relevant Orders   Hemoglobin A1c (Completed)   Comprehensive metabolic panel (Completed)   Muscle cramping     Hydrate and monitor       Relevant Orders   Hemoglobin A1c (Completed)    Meds ordered this encounter  Medications   NOVOLOG 100 UNIT/ML injection    Sig: Inject 15 Units into the skin 4 (four) times daily as needed for high blood sugar.    Dispense:  20 mL    Refill:  5   valsartan (DIOVAN) 40 MG tablet    Sig: Take 1 tablet (40 mg total) by mouth daily.    Dispense:  90 tablet    Refill:  3   I, Penni Homans, MD, personally preformed the services described in this documentation.  All medical record entries made by the scribe were at my direction and in my presence.  I have reviewed the chart and discharge instructions (if applicable) and agree that the record reflects my personal performance and is accurate and complete. 04/23/22  I,Rachel Rivera,acting as a scribe for Penni Homans, MD.,have documented all relevant documentation on the behalf of Penni Homans, MD,as directed by  Penni Homans, MD while in the presence of Penni Homans, MD.   Penni Homans, MD

## 2022-04-21 ENCOUNTER — Encounter: Payer: Self-pay | Admitting: Gastroenterology

## 2022-04-21 LAB — CBC WITH DIFFERENTIAL/PLATELET
Basophils Absolute: 0.1 10*3/uL (ref 0.0–0.1)
Basophils Relative: 1 % (ref 0.0–3.0)
Eosinophils Absolute: 0.2 10*3/uL (ref 0.0–0.7)
Eosinophils Relative: 2.2 % (ref 0.0–5.0)
HCT: 43.2 % (ref 39.0–52.0)
Hemoglobin: 14.4 g/dL (ref 13.0–17.0)
Lymphocytes Relative: 22.1 % (ref 12.0–46.0)
Lymphs Abs: 1.9 10*3/uL (ref 0.7–4.0)
MCHC: 33.2 g/dL (ref 30.0–36.0)
MCV: 88.2 fl (ref 78.0–100.0)
Monocytes Absolute: 0.7 10*3/uL (ref 0.1–1.0)
Monocytes Relative: 7.6 % (ref 3.0–12.0)
Neutro Abs: 5.7 10*3/uL (ref 1.4–7.7)
Neutrophils Relative %: 67.1 % (ref 43.0–77.0)
Platelets: 217 10*3/uL (ref 150.0–400.0)
RBC: 4.9 Mil/uL (ref 4.22–5.81)
RDW: 12.7 % (ref 11.5–15.5)
WBC: 8.5 10*3/uL (ref 4.0–10.5)

## 2022-04-21 LAB — TSH: TSH: 2.79 u[IU]/mL (ref 0.35–5.50)

## 2022-04-21 LAB — COMPREHENSIVE METABOLIC PANEL
ALT: 19 U/L (ref 0–53)
AST: 17 U/L (ref 0–37)
Albumin: 4.2 g/dL (ref 3.5–5.2)
Alkaline Phosphatase: 77 U/L (ref 39–117)
BUN: 21 mg/dL (ref 6–23)
CO2: 31 mEq/L (ref 19–32)
Calcium: 9.4 mg/dL (ref 8.4–10.5)
Chloride: 101 mEq/L (ref 96–112)
Creatinine, Ser: 0.97 mg/dL (ref 0.40–1.50)
GFR: 84.08 mL/min (ref >60.00)
Glucose, Bld: 109 mg/dL — ABNORMAL HIGH (ref 70–99)
Potassium: 5 mEq/L (ref 3.5–5.1)
Sodium: 141 mEq/L (ref 135–145)
Total Bilirubin: 0.5 mg/dL (ref 0.2–1.2)
Total Protein: 6.9 g/dL (ref 6.0–8.3)

## 2022-04-21 LAB — LIPID PANEL
Cholesterol: 170 mg/dL (ref 0–200)
HDL: 48.3 mg/dL (ref >39.00)
LDL Cholesterol: 102 mg/dL — ABNORMAL HIGH (ref 0–99)
NonHDL: 121.87
Total CHOL/HDL Ratio: 4
Triglycerides: 97 mg/dL (ref 0.0–149.0)
VLDL: 19.4 mg/dL (ref 0.0–40.0)

## 2022-04-21 LAB — HEMOGLOBIN A1C: Hgb A1c MFr Bld: 7.8 % — ABNORMAL HIGH (ref 4.6–6.5)

## 2022-04-23 NOTE — Assessment & Plan Note (Signed)
Well controlled, no changes to meds. Encouraged heart healthy diet such as the DASH diet and exercise as tolerated.  °

## 2022-04-23 NOTE — Assessment & Plan Note (Signed)
hgba1c acceptable, minimize simple carbs. Increase exercise as tolerated. Continue current meds was given new prescription for Novolog.

## 2022-04-23 NOTE — Assessment & Plan Note (Signed)
Hydrate and monitor 

## 2022-04-23 NOTE — Assessment & Plan Note (Signed)
Tolerating statin, encouraged heart healthy diet, avoid trans fats, minimize simple carbs and saturated fats. Increase exercise as tolerated 

## 2022-04-28 ENCOUNTER — Ambulatory Visit: Payer: No Typology Code available for payment source

## 2022-05-05 ENCOUNTER — Other Ambulatory Visit: Payer: Self-pay | Admitting: Family Medicine

## 2022-05-06 ENCOUNTER — Ambulatory Visit (INDEPENDENT_AMBULATORY_CARE_PROVIDER_SITE_OTHER): Payer: No Typology Code available for payment source | Admitting: Family Medicine

## 2022-05-06 VITALS — BP 156/78 | HR 61

## 2022-05-06 DIAGNOSIS — I10 Essential (primary) hypertension: Secondary | ICD-10-CM | POA: Diagnosis not present

## 2022-05-06 MED ORDER — VALSARTAN 40 MG PO TABS: 80.0000 mg | ORAL_TABLET | Freq: Every day | ORAL | 3 refills | Status: DC

## 2022-05-06 NOTE — Progress Notes (Signed)
Pt here for Blood pressure check per Dr. Charlett Blake.  Pt currently takes: valsartan 40mg   There were 3 other prescriptions on file for bp but pt stated he was not taking those (hydralazine, carvedilol, and hctz).  MAR has been updated.   Pt reports compliance with medication.  BP Readings from Last 3 Encounters:  04/20/22 (!) 160/74  01/09/22 (!) 160/70  12/16/21 137/87   Pt has been checking at home and averages about 175/75.  He does know and states that it is high.  BP today Right arm 166/80 HR 63 Left arm 158/78 HR 62  BP Recheck Right arm 158/80 HR 62 Left arm 156/78 HR 61   Pt advised per Dr. Nani Ravens double dose increase valsartan to 80mg  and recheck with Dr. Charlett Blake in 2 weeks.  Dr. Charlett Blake had no openings in 2 weeks so we scheduled with her NP.

## 2022-05-25 ENCOUNTER — Ambulatory Visit (INDEPENDENT_AMBULATORY_CARE_PROVIDER_SITE_OTHER): Payer: No Typology Code available for payment source | Admitting: Family Medicine

## 2022-05-25 ENCOUNTER — Encounter: Payer: Self-pay | Admitting: Family Medicine

## 2022-05-25 VITALS — BP 165/78 | HR 65 | Temp 97.6°F | Resp 16 | Ht 67.0 in | Wt 203.0 lb

## 2022-05-25 DIAGNOSIS — I10 Essential (primary) hypertension: Secondary | ICD-10-CM

## 2022-05-25 LAB — BASIC METABOLIC PANEL
BUN: 22 mg/dL (ref 6–23)
CO2: 31 mEq/L (ref 19–32)
Calcium: 9.6 mg/dL (ref 8.4–10.5)
Chloride: 99 mEq/L (ref 96–112)
Creatinine, Ser: 0.84 mg/dL (ref 0.40–1.50)
GFR: 93.86 mL/min (ref >60.00)
Glucose, Bld: 142 mg/dL — ABNORMAL HIGH (ref 70–99)
Potassium: 4.6 mEq/L (ref 3.5–5.1)
Sodium: 136 mEq/L (ref 135–145)

## 2022-05-25 MED ORDER — HYDROCHLOROTHIAZIDE 12.5 MG PO TABS: 12.5000 mg | ORAL_TABLET | Freq: Every day | ORAL | 3 refills | Status: DC

## 2022-05-25 NOTE — Patient Instructions (Signed)
Blood pressure is not at goal for age and co-morbidities.   Recommendations: continue valsartan 80 mg daily, start hydrochlorothiazide 12.5 mg daily - BP goal <130/80 - monitor and log blood pressures at home - check around the same time each day in a relaxed setting - Limit salt to <2000 mg/day - Follow DASH eating plan (heart healthy diet) - limit alcohol to 2 standard drinks per day for men and 1 per day for women - avoid tobacco products - get at least 2 hours of regular aerobic exercise weekly Patient aware of signs/symptoms requiring further/urgent evaluation. BMP updated today. Follow-up in 2 weeks.

## 2022-05-25 NOTE — Progress Notes (Signed)
   Established Patient Office Visit  Subjective   Patient ID: Scott Adkins, male    DOB: 04-16-1960  Age: 62 y.o. MRN: [id number]  Chief Complaint  Patient presents with   Follow-up    BP    HPI  Patient is here for blood pressure follow-up. At nurse visit on 05/06/22 BP was high and Dr. Nani Ravens increased his valsartan to 80 mg daily. Reports he has been feeling fine since then. No changes.   Hypertension: - Medications: valsartan 80 mg daily  - Compliance: good - Checking BP at home: no, reports his home machine stopped working  - Denies any SOB, recurrent headaches, CP, vision changes, LE edema, dizziness, palpitations, or medication side effects. - Diet: low sodium - Exercise: walks his dog multiple times a day        ROS All review of systems negative except what is listed in the HPI    Objective:     BP (!) 165/78   Pulse 65   Temp 97.6 F (36.4 C)   Resp 16   Ht 5' 7"$  (1.702 m)   Wt 203 lb (92.1 kg)   SpO2 98%   BMI 31.79 kg/m    Physical Exam Vitals reviewed.  Constitutional:      Appearance: Normal appearance.  Cardiovascular:     Rate and Rhythm: Normal rate and regular rhythm.     Pulses: Normal pulses.     Heart sounds: Normal heart sounds.  Pulmonary:     Effort: Pulmonary effort is normal.     Breath sounds: Normal breath sounds.  Skin:    General: Skin is warm and dry.  Neurological:     Mental Status: He is alert and oriented to person, place, and time.  Psychiatric:        Mood and Affect: Mood normal.        Behavior: Behavior normal.        Thought Content: Thought content normal.        Judgment: Judgment normal.      No results found for any visits on 05/25/22.    The 10-year ASCVD risk score (Arnett DK, et al., 2019) is: 26.7%    Assessment & Plan:   Problem List Items Addressed This Visit       Cardiovascular and Mediastinum   Essential hypertension - Primary    Blood pressure is not at goal for age and  co-morbidities.   Recommendations: continue valsartan 80 mg daily, start hydrochlorothiazide 12.5 mg daily - BP goal <130/80 - monitor and log blood pressures at home - check around the same time each day in a relaxed setting - Limit salt to <2000 mg/day - Follow DASH eating plan (heart healthy diet) - limit alcohol to 2 standard drinks per day for men and 1 per day for women - avoid tobacco products - get at least 2 hours of regular aerobic exercise weekly Patient aware of signs/symptoms requiring further/urgent evaluation. BMP updated today. Follow-up in 2 weeks.       Relevant Medications   hydrochlorothiazide (HYDRODIURIL) 12.5 MG tablet   Other Relevant Orders   Basic Metabolic Panel (BMET)    Return in about 2 weeks (around 06/08/2022) for BP follow-up.    Terrilyn Saver, NP

## 2022-05-25 NOTE — Assessment & Plan Note (Signed)
Blood pressure is not at goal for age and co-morbidities.   Recommendations: continue valsartan 80 mg daily, start hydrochlorothiazide 12.5 mg daily - BP goal <130/80 - monitor and log blood pressures at home - check around the same time each day in a relaxed setting - Limit salt to <2000 mg/day - Follow DASH eating plan (heart healthy diet) - limit alcohol to 2 standard drinks per day for men and 1 per day for women - avoid tobacco products - get at least 2 hours of regular aerobic exercise weekly Patient aware of signs/symptoms requiring further/urgent evaluation. BMP updated today. Follow-up in 2 weeks.

## 2022-06-04 ENCOUNTER — Other Ambulatory Visit: Payer: Self-pay | Admitting: Family Medicine

## 2022-06-08 ENCOUNTER — Ambulatory Visit: Payer: No Typology Code available for payment source | Admitting: Family Medicine

## 2022-06-08 VITALS — BP 152/82 | HR 59

## 2022-06-08 DIAGNOSIS — I10 Essential (primary) hypertension: Secondary | ICD-10-CM

## 2022-06-08 MED ORDER — HYDROCHLOROTHIAZIDE 25 MG PO TABS: 25.0000 mg | ORAL_TABLET | Freq: Every day | ORAL | 0 refills | Status: DC

## 2022-06-08 NOTE — Progress Notes (Unsigned)
Pt here for Blood pressure check per Dr. Charlett Blake  Pt currently takes: valsartan '40mg'$ , hctz 12.'5mg'$    Pt reports compliance with medication.  BP Readings from Last 3 Encounters:  05/25/22 (!) 165/78  05/06/22 (!) 156/78  04/20/22 (!) 160/74    BP today:   Right arm 152/82  HR 60 Left arm 158/80  HR 59  BP recheck Right arm 150/80  HR 57 Let arm 152/82  HR 59  Pt advised per Dr. Charlett Blake and Lovena Le that blood pressure still a little high.  We will increase to  hctz '25mg'$  and recheck blood pressure and cmp in 3-4 weeks.  Also advised patient to purchase new blood pressure machine and to log them and bring in at next visit if possible.  Prescription sent in and future order placed.  Appointment made for 06/29/22, patient if off on Mondays and need an appointment on Monday.

## 2022-06-29 ENCOUNTER — Other Ambulatory Visit (INDEPENDENT_AMBULATORY_CARE_PROVIDER_SITE_OTHER): Payer: No Typology Code available for payment source

## 2022-06-29 ENCOUNTER — Ambulatory Visit: Payer: No Typology Code available for payment source | Admitting: Family Medicine

## 2022-06-29 ENCOUNTER — Ambulatory Visit: Payer: No Typology Code available for payment source

## 2022-06-29 VITALS — BP 138/77 | HR 63

## 2022-06-29 DIAGNOSIS — I10 Essential (primary) hypertension: Secondary | ICD-10-CM

## 2022-06-29 LAB — COMPREHENSIVE METABOLIC PANEL
ALT: 21 U/L (ref 0–53)
AST: 19 U/L (ref 0–37)
Albumin: 4 g/dL (ref 3.5–5.2)
Alkaline Phosphatase: 78 U/L (ref 39–117)
BUN: 20 mg/dL (ref 6–23)
CO2: 32 mEq/L (ref 19–32)
Calcium: 9.2 mg/dL (ref 8.4–10.5)
Chloride: 99 mEq/L (ref 96–112)
Creatinine, Ser: 0.97 mg/dL (ref 0.40–1.50)
GFR: 83.97 mL/min (ref >60.00)
Glucose, Bld: 235 mg/dL — ABNORMAL HIGH (ref 70–99)
Potassium: 4.5 mEq/L (ref 3.5–5.1)
Sodium: 137 mEq/L (ref 135–145)
Total Bilirubin: 0.5 mg/dL (ref 0.2–1.2)
Total Protein: 6.4 g/dL (ref 6.0–8.3)

## 2022-06-29 NOTE — Progress Notes (Unsigned)
Pt here for Blood pressure check per Dr. Charlett Blake  Pt currently takes: valsartan 40mg , hctz 25mg   Has been checking at home and has still been running high top number between 150-159.  He takes both medications at 9pm at night.  He checks blood pressures around 330am.  Pt reports compliance with medication.  BP Readings from Last 3 Encounters:  06/08/22 (!) 152/82  05/25/22 (!) 165/78  05/06/22 (!) 156/78   BP today right arm 138/77  HR: 63  Pt advised per Dr. Charlett Blake to take hctz 25mg  in the morning and valsartan at night to see if it will take care of the spikes.  We will follow up at next office visit on 07/27/22.

## 2022-07-05 ENCOUNTER — Other Ambulatory Visit: Payer: Self-pay | Admitting: Family Medicine

## 2022-07-09 ENCOUNTER — Other Ambulatory Visit: Payer: Self-pay | Admitting: Family Medicine

## 2022-07-26 NOTE — Assessment & Plan Note (Signed)
Tolerating statin, encouraged heart healthy diet, avoid trans fats, minimize simple carbs and saturated fats. Increase exercise as tolerated 

## 2022-07-26 NOTE — Assessment & Plan Note (Signed)
Hydrate and monitor 

## 2022-07-26 NOTE — Assessment & Plan Note (Signed)
Doing well on Sertraline 50 mg daily.  

## 2022-07-26 NOTE — Assessment & Plan Note (Signed)
Well controlled, no changes to meds. Encouraged heart healthy diet such as the DASH diet and exercise as tolerated.  °

## 2022-07-26 NOTE — Assessment & Plan Note (Addendum)
hgba1c unacceptable, minimize simple carbs. Increase exercise as tolerated. Continue current meds but increase Levemir to 20 units by increasing by 2 units every 3 days as able. Increase protein intake. He had been out of his insulin prior to visit

## 2022-07-27 ENCOUNTER — Other Ambulatory Visit: Payer: Self-pay

## 2022-07-27 ENCOUNTER — Ambulatory Visit (INDEPENDENT_AMBULATORY_CARE_PROVIDER_SITE_OTHER): Payer: No Typology Code available for payment source | Admitting: Family Medicine

## 2022-07-27 VITALS — BP 140/80 | HR 71 | Temp 97.5°F | Resp 16 | Ht 67.0 in | Wt 202.2 lb

## 2022-07-27 DIAGNOSIS — E103292 Type 1 diabetes mellitus with mild nonproliferative diabetic retinopathy without macular edema, left eye: Secondary | ICD-10-CM

## 2022-07-27 DIAGNOSIS — F418 Other specified anxiety disorders: Secondary | ICD-10-CM

## 2022-07-27 DIAGNOSIS — E782 Mixed hyperlipidemia: Secondary | ICD-10-CM | POA: Diagnosis not present

## 2022-07-27 DIAGNOSIS — R252 Cramp and spasm: Secondary | ICD-10-CM

## 2022-07-27 DIAGNOSIS — I10 Essential (primary) hypertension: Secondary | ICD-10-CM | POA: Diagnosis not present

## 2022-07-27 MED ORDER — INSULIN DETEMIR 100 UNIT/ML ~~LOC~~ SOLN: 20.0000 [IU] | Freq: Every day | SUBCUTANEOUS | 1 refills | Status: DC

## 2022-07-27 NOTE — Patient Instructions (Signed)

## 2022-07-27 NOTE — Progress Notes (Signed)
Subjective:   By signing my name below, I, Barrett Shell, attest that this documentation has been prepared under the direction and in the presence of Bradd Canary, MD. 07/27/2022   Patient ID: Scott Adkins, male    DOB: 1960/04/20, 62 y.o.   MRN: [id number]  No chief complaint on file.   HPI Patient is in today for a follow-up appointment.  Acute He denies having any trouble swallowing, bowel issues, chest pain, or SOB.   Blood sugar His A1C was 7.8 during his last blood work. His blood sugar high is 430 after eating and his lowest is 65. The 430 reading was taken after eating peanut butter and jelly sandwiches and the 65 reading was taken after eating a small breakfast. His average blood sugar reading is 160. He denies having any associated symptoms with his high and low blood sugars. He takes 100 mL novolog injections 4 times daily PRN. He ran out of 100 mL levimir injections and is currently not taking them.  Blood pressure His blood pressure is elevated during this visit.  BP Readings from Last 3 Encounters:  07/27/22 (!) 140/80  06/29/22 138/77  06/08/22 (!) 152/82   Pulse Readings from Last 3 Encounters:  07/27/22 71  06/29/22 63  06/08/22 (!) 59    Exercise  He walks his dog about 3 miles a day.  Past Medical History:  Diagnosis Date   Anemia 12/22/2015   Arthritis    Chicken pox as a child   Constipation 12/22/2016   Diabetes mellitus type I    uses insulin pump// managed by Dr. Casimiro Needle Altheimer   DM (diabetes mellitus), type 1 with ophthalmic complications 09/05/2014   Hyperlipidemia    Hypertension    Injury of right rotator cuff 08/16/2012   Has been seen by [facility]   Left flank pain 09/12/2015   Mumps as a child   Pain in joint, lower leg 01/24/2016   Preventative health care 02/19/2013   Sun-damaged skin 12/06/2015    Past Surgical History:  Procedure Laterality Date   insulin pump     placed and removed   REFRACTIVE SURGERY Right 2007   [facility]   ROTATOR CUFF REPAIR  2004   left    Family History  Problem Relation Age of Onset   Hypertension Mother    Stroke Father    Hypertension Father    Hyperlipidemia Father    Hypertension Brother    Hyperlipidemia Brother    Mental illness Brother        depression   Heart disease Maternal Grandmother        MI   Heart disease Maternal Grandfather        MI   Hyperlipidemia Brother    Hypertension Brother    Colon cancer Neg Hx    Prostate cancer Neg Hx    Breast cancer Neg Hx    Rectal cancer Neg Hx    Diabetes Neg Hx     Social History   Socioeconomic History   Marital status: Married    Spouse name: Not on file   Number of children: 3   Years of education: Not on file   Highest education level: 12th grade  Occupational History   Occupation: welder  Tobacco Use   Smoking status: Never   Smokeless tobacco: Never  Substance and Sexual Activity   Alcohol use: No    Alcohol/week: 1.0 standard drink of alcohol    Types:  1 Shots of liquor per week   Drug use: No   Sexual activity: Not on file    Comment: lives with wife and mother in law, no dietary restrictions  Other Topics Concern   Not on file  Social History Narrative   Married 14 yrs   2 daughters; 1 son   Occupation: Psychologist, occupational      Social Determinants of Corporate investment banker Strain: Medium Risk (07/20/2022)   Overall Financial Resource Strain (CARDIA)    Difficulty of Paying Living Expenses: Somewhat hard  Food Insecurity: No Food Insecurity (07/20/2022)   Hunger Vital Sign    Worried About Running Out of Food in the Last Year: Never true    Ran Out of Food in the Last Year: Never true  Transportation Needs: No Transportation Needs (07/20/2022)   PRAPARE - Administrator, Civil Service (Medical): No    Lack of Transportation (Non-Medical): No  Physical Activity: Sufficiently Active (07/20/2022)   Exercise Vital Sign    Days of Exercise per Week: 7 days    Minutes of Exercise  per Session: 30 min  Stress: No Stress Concern Present (07/20/2022)   [employer] of Occupational Health - Occupational Stress Questionnaire    Feeling of Stress : Only a little  Social Connections: Unknown (07/20/2022)   Social Connection and Isolation Panel [NHANES]    Frequency of Communication with Friends and Family: More than three times a week    Frequency of Social Gatherings with Friends and Family: Once a week    Attends Religious Services: Patient declined    Database administrator or Organizations: No    Attends Engineer, structural: Not on file    Marital Status: Married  Catering manager Violence: Not on file    Outpatient Medications Prior to Visit  Medication Sig Dispense Refill   aspirin 81 MG tablet Take 81 mg by mouth daily.     atorvastatin (LIPITOR) 20 MG tablet Take 1 tablet (20 mg total) by mouth daily. 90 tablet 1   Cholecalciferol (VITAMIN D3) 1000 UNITS CAPS Take 2,000 Units by mouth daily.     Continuous Blood Gluc Receiver (DEXCOM G7 RECEIVER) DEVI Use with sensors to check blood glucose (back up or cell phone) 1 each 0   Continuous Blood Gluc Sensor (DEXCOM G7 SENSOR) MISC USE TO CHECK BLOOD GLUCOSE CONTINUOUSLY. CHANGE SENSOR EVERY 10 DAYS 3 each 0   fluticasone (FLONASE) 50 MCG/ACT nasal spray Place 2 sprays into both nostrils daily. 16 g 1   hydrochlorothiazide (HYDRODIURIL) 25 MG tablet Take 1 tablet (25 mg total) by mouth daily. 30 tablet 0   insulin detemir (LEVEMIR) 100 UNIT/ML injection INJECT 12 UNITS SUBCUTANEOUSLY AT BEDTIME 10 mL 3   NOVOLOG 100 UNIT/ML injection Inject 15 Units into the skin 4 (four) times daily as needed for high blood sugar. 20 mL 5   sertraline (ZOLOFT) 50 MG tablet Take 1 tablet by mouth once daily 90 tablet 0   valsartan (DIOVAN) 40 MG tablet Take 2 tablets (80 mg total) by mouth daily. 90 tablet 3   No facility-administered medications prior to visit.    Allergies  Allergen Reactions   Humalog [Insulin  Lispro] Other (See Comments)    Sugar drops dangerously low    Lisinopril     Hyperkalemia    Viagra [Sildenafil Citrate] Other (See Comments)    headache    Review of Systems  HENT:         (-)  trouble swallowing  Respiratory:  Negative for cough and shortness of breath.   Cardiovascular:  Negative for chest pain.  Gastrointestinal:        (-) bowel issues        Objective:    Physical Exam Constitutional:      General: He is not in acute distress.    Appearance: Normal appearance.  HENT:     Head: Normocephalic and atraumatic.     Right Ear: External ear normal.     Left Ear: External ear normal.  Eyes:     Extraocular Movements: Extraocular movements intact.     Pupils: Pupils are equal, round, and reactive to light.  Cardiovascular:     Rate and Rhythm: Normal rate and regular rhythm.     Heart sounds: No murmur heard.    No gallop.  Pulmonary:     Effort: Pulmonary effort is normal. No respiratory distress.     Breath sounds: Normal breath sounds. No wheezing or rales.  Skin:    General: Skin is warm.  Neurological:     Mental Status: He is alert and oriented to person, place, and time.  Psychiatric:        Judgment: Judgment normal.     There were no vitals taken for this visit. 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  All medical record entries made by the scribe were at my direction and in my presence.  I have reviewed the chart and discharge instructions (if applicable) and agree that the record reflects my personal performance and is accurate and complete.  07/27/2022  Barrett Shell  Mercer Pod as a scribe for Danise Edge, MD.,have documented all relevant documentation on the behalf of Danise Edge, MD,as directed by  Danise Edge, MD while in the presence of Danise Edge, MD.

## 2022-07-28 ENCOUNTER — Other Ambulatory Visit: Payer: Self-pay

## 2022-07-28 ENCOUNTER — Encounter: Payer: Self-pay | Admitting: Family Medicine

## 2022-07-28 LAB — LIPID PANEL
Cholesterol: 175 mg/dL (ref 0–200)
HDL: 51.5 mg/dL (ref >39.00)
LDL Cholesterol: 85 mg/dL (ref 0–99)
NonHDL: 123.87
Total CHOL/HDL Ratio: 3
Triglycerides: 196 mg/dL — ABNORMAL HIGH (ref 0.0–149.0)
VLDL: 39.2 mg/dL (ref 0.0–40.0)

## 2022-07-28 LAB — CBC WITH DIFFERENTIAL/PLATELET
Basophils Absolute: 0.1 10*3/uL (ref 0.0–0.1)
Basophils Relative: 0.6 % (ref 0.0–3.0)
Eosinophils Absolute: 0.1 10*3/uL (ref 0.0–0.7)
Eosinophils Relative: 1.5 % (ref 0.0–5.0)
HCT: 41.3 % (ref 39.0–52.0)
Hemoglobin: 13.7 g/dL (ref 13.0–17.0)
Lymphocytes Relative: 15.1 % (ref 12.0–46.0)
Lymphs Abs: 1.4 10*3/uL (ref 0.7–4.0)
MCHC: 33.1 g/dL (ref 30.0–36.0)
MCV: 89 fl (ref 78.0–100.0)
Monocytes Absolute: 0.7 10*3/uL (ref 0.1–1.0)
Monocytes Relative: 7.6 % (ref 3.0–12.0)
Neutro Abs: 6.7 10*3/uL (ref 1.4–7.7)
Neutrophils Relative %: 75.2 % (ref 43.0–77.0)
Platelets: 203 10*3/uL (ref 150.0–400.0)
RBC: 4.64 Mil/uL (ref 4.22–5.81)
RDW: 12.7 % (ref 11.5–15.5)
WBC: 8.9 10*3/uL (ref 4.0–10.5)

## 2022-07-28 LAB — COMPREHENSIVE METABOLIC PANEL
ALT: 23 U/L (ref 0–53)
AST: 23 U/L (ref 0–37)
Albumin: 3.9 g/dL (ref 3.5–5.2)
Alkaline Phosphatase: 73 U/L (ref 39–117)
BUN: 20 mg/dL (ref 6–23)
CO2: 30 mEq/L (ref 19–32)
Calcium: 9.2 mg/dL (ref 8.4–10.5)
Chloride: 100 mEq/L (ref 96–112)
Creatinine, Ser: 0.98 mg/dL (ref 0.40–1.50)
GFR: 82.89 mL/min (ref >60.00)
Glucose, Bld: 140 mg/dL — ABNORMAL HIGH (ref 70–99)
Potassium: 4 mEq/L (ref 3.5–5.1)
Sodium: 138 mEq/L (ref 135–145)
Total Bilirubin: 0.3 mg/dL (ref 0.2–1.2)
Total Protein: 6.4 g/dL (ref 6.0–8.3)

## 2022-07-28 LAB — TSH: TSH: 2.3 u[IU]/mL (ref 0.35–5.50)

## 2022-07-28 LAB — HEMOGLOBIN A1C: Hgb A1c MFr Bld: 8 % — ABNORMAL HIGH (ref 4.6–6.5)

## 2022-07-28 MED ORDER — PEN NEEDLES 32G X 4 MM MISC: 1 refills | Status: DC

## 2022-07-28 MED ORDER — ATORVASTATIN CALCIUM 20 MG PO TABS: 20.0000 mg | ORAL_TABLET | Freq: Every day | ORAL | 1 refills | Status: DC

## 2022-07-28 MED ORDER — HYDROCHLOROTHIAZIDE 25 MG PO TABS: 25.0000 mg | ORAL_TABLET | Freq: Every day | ORAL | 0 refills | Status: DC

## 2022-08-03 ENCOUNTER — Encounter: Payer: Self-pay | Admitting: Family Medicine

## 2022-08-03 MED ORDER — VALSARTAN 40 MG PO TABS: 80.0000 mg | ORAL_TABLET | Freq: Every day | ORAL | 1 refills | Status: DC

## 2022-08-04 ENCOUNTER — Other Ambulatory Visit: Payer: Self-pay | Admitting: Family Medicine

## 2022-09-13 ENCOUNTER — Encounter: Payer: Self-pay | Admitting: Family Medicine

## 2022-09-14 MED ORDER — HYDROCHLOROTHIAZIDE 25 MG PO TABS: 25.0000 mg | ORAL_TABLET | Freq: Every day | ORAL | 1 refills | Status: DC

## 2022-09-19 ENCOUNTER — Encounter: Payer: Self-pay | Admitting: Family Medicine

## 2022-09-22 ENCOUNTER — Encounter: Payer: Self-pay | Admitting: Family Medicine

## 2022-09-22 DIAGNOSIS — E103292 Type 1 diabetes mellitus with mild nonproliferative diabetic retinopathy without macular edema, left eye: Secondary | ICD-10-CM

## 2022-09-23 MED ORDER — INSULIN DETEMIR 100 UNIT/ML ~~LOC~~ SOLN: 20.0000 [IU] | Freq: Every day | SUBCUTANEOUS | 1 refills | Status: DC

## 2022-09-23 MED ORDER — NOVOLOG 100 UNIT/ML IJ SOLN: 15.0000 [IU] | Freq: Four times a day (QID) | INTRAMUSCULAR | 5 refills | Status: DC | PRN

## 2022-10-04 ENCOUNTER — Other Ambulatory Visit: Payer: Self-pay | Admitting: Family Medicine

## 2022-10-05 ENCOUNTER — Encounter: Payer: Self-pay | Admitting: Family Medicine

## 2022-10-09 ENCOUNTER — Encounter: Payer: Self-pay | Admitting: Family Medicine

## 2022-10-09 MED ORDER — INSULIN DETEMIR 100 UNIT/ML ~~LOC~~ SOLN: 20.0000 [IU] | Freq: Every day | SUBCUTANEOUS | 1 refills | Status: DC

## 2022-10-09 MED ORDER — SERTRALINE HCL 50 MG PO TABS: 50.0000 mg | ORAL_TABLET | Freq: Every day | ORAL | 1 refills | Status: DC

## 2022-11-08 NOTE — Assessment & Plan Note (Signed)
Hydrate and monitor 

## 2022-11-08 NOTE — Assessment & Plan Note (Signed)
No recent exacerbation no changes 

## 2022-11-08 NOTE — Assessment & Plan Note (Signed)
hgba1c unacceptable, minimize simple carbs. Increase exercise as tolerated. Continue current meds

## 2022-11-08 NOTE — Assessment & Plan Note (Signed)
Tolerating statin, encouraged heart healthy diet, avoid trans fats, minimize simple carbs and saturated fats. Increase exercise as tolerated 

## 2022-11-08 NOTE — Assessment & Plan Note (Signed)
Well controlled, no changes to meds. Encouraged heart healthy diet such as the DASH diet and exercise as tolerated.  °

## 2022-11-09 ENCOUNTER — Ambulatory Visit: Payer: No Typology Code available for payment source | Admitting: Family Medicine

## 2022-11-09 ENCOUNTER — Encounter: Payer: Self-pay | Admitting: Family Medicine

## 2022-11-09 VITALS — BP 138/82 | HR 74 | Temp 98.0°F | Resp 16 | Ht 67.0 in | Wt 202.0 lb

## 2022-11-09 DIAGNOSIS — E782 Mixed hyperlipidemia: Secondary | ICD-10-CM

## 2022-11-09 DIAGNOSIS — I10 Essential (primary) hypertension: Secondary | ICD-10-CM | POA: Diagnosis not present

## 2022-11-09 DIAGNOSIS — J45909 Unspecified asthma, uncomplicated: Secondary | ICD-10-CM

## 2022-11-09 DIAGNOSIS — E103292 Type 1 diabetes mellitus with mild nonproliferative diabetic retinopathy without macular edema, left eye: Secondary | ICD-10-CM | POA: Diagnosis not present

## 2022-11-09 DIAGNOSIS — R252 Cramp and spasm: Secondary | ICD-10-CM

## 2022-11-09 LAB — MICROALBUMIN / CREATININE URINE RATIO
Creatinine,U: 31.9 mg/dL
Microalb Creat Ratio: 2.2 mg/g (ref 0.0–30.0)
Microalb, Ur: <0.7 mg/dL (ref 0.0–1.9)

## 2022-11-09 NOTE — Progress Notes (Signed)
Subjective:    Patient ID: Scott Adkins, male    DOB: November 20, 1960, 62 y.o.   MRN: [id number]  Chief Complaint  Patient presents with  . Follow-up    Follow up    HPI Discussed the use of AI scribe software for clinical note transcription with the patient, who gave verbal consent to proceed.  History of Present Illness   The patient, with a history of diabetes and depression, presents with left shoulder pain due to a work-related injury that occurred over a month ago. The pain has been severe enough to disrupt sleep, leading the patient to sleep in a recliner to avoid rolling over onto the injured shoulder. Despite the pain, the patient has continued to work, albeit with difficulty due to the inability to lift with the injured arm.  In addition to the shoulder pain, the patient has been experiencing fluctuations in blood sugar levels, ranging from 36 to 185. The patient has noticed an improvement in his ability to manage these fluctuations since starting medication for depression. The patient has been proactive in managing his blood sugar levels, carrying milk to work to consume when levels drop and maintaining a diet high in protein.        Past Medical History:  Diagnosis Date  . Anemia 12/22/2015  . Arthritis   . Chicken pox as a child  . Constipation 12/22/2016  . Diabetes mellitus type I (HCC)    uses insulin pump// managed by Dr. Casimiro Needle Altheimer  . 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 Hyperlipidemia   . Hypertension   . Injury of right rotator cuff 08/16/2012   Has been seen by [facility]  . Left flank pain 09/12/2015  . Mumps as a child  . Pain in joint, lower leg 01/24/2016  . Preventative health care 02/19/2013  . Sun-damaged skin 12/06/2015    Past Surgical History:  Procedure Laterality Date  . insulin pump     placed and removed  . REFRACTIVE SURGERY Right 2007   [facility]  . ROTATOR CUFF REPAIR  2004   left     Family History  Problem Relation Age of Onset  . Hypertension Mother   . Stroke Father   . Hypertension Father   . Hyperlipidemia Father   . Hypertension Brother   . Hyperlipidemia Brother   . Mental illness Brother        depression  . Heart disease Maternal Grandmother        MI  . Heart disease Maternal Grandfather        MI  . Hyperlipidemia Brother   . Hypertension Brother   . Colon cancer Neg Hx   . Prostate cancer Neg Hx   . Breast cancer Neg Hx   . Rectal cancer Neg Hx   . Diabetes Neg Hx     Social History   Socioeconomic History  . Marital status: Married    Spouse name: Not on file  . Number of children: 3  . Years of education: Not on file  . Highest education level: 12th grade  Occupational History  . Occupation: welder  Tobacco Use  . Smoking status: Never  . Smokeless tobacco: Never  Substance and Sexual Activity  . Alcohol use: No    Alcohol/week: 1.0 standard drink of alcohol    Types: 1 Shots of liquor per week  . Drug use: No  . Sexual activity: Not on file    Comment:  lives with wife and mother in law, no dietary restrictions  Other Topics Concern  . Not on file  Social History Narrative   Married 14 yrs   2 daughters; 1 son   Occupation: Psychologist, occupational      Social Determinants of Health   Financial Resource Strain: Medium Risk (07/20/2022)   Overall Financial Resource Strain (CARDIA)   . Difficulty of Paying Living Expenses: Somewhat hard  Food Insecurity: No Food Insecurity (07/20/2022)   Hunger Vital Sign   . Worried About Programme researcher, broadcasting/film/video in the Last Year: Never true   . Ran Out of Food in the Last Year: Never true  Transportation Needs: No Transportation Needs (07/20/2022)   PRAPARE - Transportation   . Lack of Transportation (Medical): No   . Lack of Transportation (Non-Medical): No  Physical Activity: Sufficiently Active (07/20/2022)   Exercise Vital Sign   . Days of Exercise per Week: 7 days   . Minutes of Exercise per  Session: 30 min  Stress: No Stress Concern Present (07/20/2022)   [employer] of Occupational Health - Occupational Stress Questionnaire   . Feeling of Stress : Only a little  Social Connections: Unknown (07/20/2022)   Social Connection and Isolation Panel [NHANES]   . Frequency of Communication with Friends and Family: More than three times a week   . Frequency of Social Gatherings with Friends and Family: Once a week   . Attends Religious Services: Patient declined   . Active Member of Clubs or Organizations: No   . Attends Banker Meetings: Not on file   . Marital Status: Married  Catering manager Violence: Unknown (07/08/2021)   Received from [facility]   HITS   . Physically Hurt: Not on file   . Insult or Talk Down To: Not on file   . Threaten Physical Harm: Not on file   . Scream or Curse: Not on file    Outpatient Medications Prior to Visit  Medication Sig Dispense Refill  . aspirin 81 MG tablet Take 81 mg by mouth daily.    Marland Kitchen atorvastatin (LIPITOR) 20 MG tablet Take 1 tablet (20 mg total) by mouth daily. 90 tablet 1  . Cholecalciferol (VITAMIN D3) 1000 UNITS CAPS Take 2,000 Units by mouth daily.    . Continuous Blood Gluc Receiver (DEXCOM G7 RECEIVER) DEVI Use with sensors to check blood glucose (back up or cell phone) 1 each 0  . Continuous Glucose Sensor (DEXCOM G7 SENSOR) MISC USE 1 SENSOR  TO CHECK BLOOD SUGAR CONTINUOUSLY  AND CHANGE EVERY 10 DAYS. 3 each 3  . fluticasone (FLONASE) 50 MCG/ACT nasal spray Place 2 sprays into both nostrils daily. 16 g 1  . hydrochlorothiazide (HYDRODIURIL) 25 MG tablet Take 1 tablet (25 mg total) by mouth daily. 90 tablet 1  . insulin detemir (LEVEMIR) 100 UNIT/ML injection Inject 0.2 mLs (20 Units total) into the skin daily. 20 mL 1  . Insulin Pen Needle (PEN NEEDLES) 32G X 4 MM MISC Use as direct once 500 each 1  . NOVOLOG 100 UNIT/ML injection Inject 15 Units into the skin 4 (four) times daily as needed  for high blood sugar. 20 mL 5  . sertraline (ZOLOFT) 50 MG tablet Take 1 tablet (50 mg total) by mouth daily. 90 tablet 1  . valsartan (DIOVAN) 40 MG tablet Take 2 tablets (80 mg total) by mouth daily. 180 tablet 1   No facility-administered medications prior to visit.    Allergies  Allergen Reactions  . Humalog [Insulin Lispro] Other (See Comments)    Sugar drops dangerously low   . Lisinopril     Hyperkalemia   . Viagra [Sildenafil Citrate] Other (See Comments)    headache    Review of Systems  Constitutional:  Negative for fever and malaise/fatigue.  HENT:  Negative for congestion.   Eyes:  Negative for blurred vision.  Respiratory:  Negative for shortness of breath.   Cardiovascular:  Negative for chest pain, palpitations and leg swelling.  Gastrointestinal:  Negative for abdominal pain, blood in stool and nausea.  Genitourinary:  Negative for dysuria and frequency.  Musculoskeletal:  Positive for joint pain. Negative for falls.  Skin:  Negative for rash.  Neurological:  Negative for dizziness, loss of consciousness and headaches.  Endo/Heme/Allergies:  Negative for environmental allergies.  Psychiatric/Behavioral:  Negative for depression. The patient is not nervous/anxious.       Objective:    Physical Exam Vitals reviewed.  Constitutional:      Appearance: Normal appearance. He is not ill-appearing.  HENT:     Head: Normocephalic and atraumatic.     Nose: Nose normal.  Eyes:     Conjunctiva/sclera: Conjunctivae normal.  Cardiovascular:     Rate and Rhythm: Normal rate.     Pulses: Normal pulses.     Heart sounds: Normal heart sounds. No murmur heard. Pulmonary:     Effort: Pulmonary effort is normal.     Breath sounds: Normal breath sounds. No wheezing.  Abdominal:     Palpations: Abdomen is soft. There is no mass.     Tenderness: There is no abdominal tenderness.  Musculoskeletal:     Cervical back: Normal range of motion.     Right lower leg: No edema.      Left lower leg: No edema.  Skin:    General: Skin is warm and dry.  Neurological:     General: No focal deficit present.     Mental Status: He is alert and oriented to person, place, and time.  Psychiatric:        Mood and Affect: Mood normal.   BP 138/82 (BP Location: Left Arm, Patient Position: Sitting, Cuff Size: Normal)   Pulse 74   Temp 98 F (36.7 C) (Oral)   Resp 16   Ht 5\' 7"  (1.702 m)   Wt 202 lb (91.6 kg)   SpO2 95%   BMI 31.64 kg/m  Wt Readings from Last 3 Encounters:  11/09/22 202 lb (91.6 kg)  07/27/22 202 lb 3.2 oz (91.7 kg)  05/25/22 203 lb (92.1 kg)    Diabetic Foot Exam - Simple   No data filed    Lab Results  Component Value Date   WBC 8.9 07/27/2022   HGB 13.7 07/27/2022   HCT 41.3 07/27/2022   PLT 203.0 07/27/2022   GLUCOSE 140 (H) 07/27/2022   CHOL 175 07/27/2022   TRIG 196.0 (H) 07/27/2022   HDL 51.50 07/27/2022   LDLDIRECT 71 09/10/2008   LDLCALC 85 07/27/2022   ALT 23 07/27/2022   AST 23 07/27/2022   NA 138 07/27/2022   K 4.0 07/27/2022   CL 100 07/27/2022   CREATININE 0.98 07/27/2022   BUN 20 07/27/2022   CO2 30 07/27/2022   TSH 2.30 07/27/2022   PSA 0.40 05/12/2021   HGBA1C 8.0 (H) 07/27/2022   MICROALBUR 1.2 05/12/2021    Lab Results  Component Value Date   TSH 2.30 07/27/2022   Lab Results  Component Value  Date   WBC 8.9 07/27/2022   HGB 13.7 07/27/2022   HCT 41.3 07/27/2022   MCV 89.0 07/27/2022   PLT 203.0 07/27/2022   Lab Results  Component Value Date   NA 138 07/27/2022   K 4.0 07/27/2022   CO2 30 07/27/2022   GLUCOSE 140 (H) 07/27/2022   BUN 20 07/27/2022   CREATININE 0.98 07/27/2022   BILITOT 0.3 07/27/2022   ALKPHOS 73 07/27/2022   AST 23 07/27/2022   ALT 23 07/27/2022   PROT 6.4 07/27/2022   ALBUMIN 3.9 07/27/2022   CALCIUM 9.2 07/27/2022   GFR 82.89 07/27/2022   Lab Results  Component Value Date   CHOL 175 07/27/2022   Lab Results  Component Value Date   HDL 51.50 07/27/2022   Lab  Results  Component Value Date   LDLCALC 85 07/27/2022   Lab Results  Component Value Date   TRIG 196.0 (H) 07/27/2022   Lab Results  Component Value Date   CHOLHDL 3 07/27/2022   Lab Results  Component Value Date   HGBA1C 8.0 (H) 07/27/2022       Assessment & Plan:  Uncomplicated asthma, unspecified asthma severity, unspecified whether persistent Assessment & Plan: No recent exacerbation no changes  Orders: -     Magnesium  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 Increase exercise as tolerated. Continue current meds   Orders: -     Hemoglobin A1c -     Microalbumin / creatinine urine ratio  Essential hypertension Assessment & Plan: Well controlled, no changes to meds. Encouraged heart healthy diet such as the DASH diet and exercise as tolerated.    Orders: -     CBC with Differential/Platelet -     Comprehensive metabolic panel -     TSH  Mixed hyperlipidemia Assessment & Plan: Tolerating statin, encouraged heart healthy diet, avoid trans fats, minimize simple carbs and saturated fats. Increase exercise as tolerated  Orders: -     Lipid panel  Muscle cramping Assessment & Plan: Hydrate and monitor      Assessment and Plan    Shoulder Injury Work-related injury over a month ago, causing significant pain and sleep disturbance. No surgical intervention yet, pending orthopedic surgeon consultation. -Continue current management and follow up with orthopedic surgeon.  Hypoglycemia Reports of blood glucose dropping to the 30s, causing symptoms of shaking. Patient has been able to correct these episodes. -Continue current management and self-monitoring of blood glucose. -Report any frequent episodes of hypoglycemia.  Hyperglycemia Reports of blood glucose levels reaching 185. A1c has been climbing. -Order blood work to check A1c,  magnesium, kidney function, and basic sugar and cholesterol levels. -Consider referral to endocrinologist if A1c continues to climb.  Insomnia Difficulty sleeping due to shoulder pain. Currently managing with over-the-counter remedies and sleep hygiene practices. -Consider prescription sleep aid or muscle relaxer if sleep disturbance continues.  Follow-up Plans -Schedule 61-month follow-up appointment. -Schedule annual physical for the second half of February 2025.         Danise Edge, MD

## 2022-11-09 NOTE — Patient Instructions (Addendum)
- *+/-

## 2022-11-12 ENCOUNTER — Encounter: Payer: Self-pay | Admitting: Family Medicine

## 2022-11-15 ENCOUNTER — Other Ambulatory Visit: Payer: Self-pay | Admitting: Family

## 2022-11-15 MED ORDER — DEXCOM G7 RECEIVER DEVI: 0 refills | Status: DC

## 2022-11-15 MED ORDER — DEXCOM G7 SENSOR MISC: 3 refills | Status: DC

## 2022-11-20 ENCOUNTER — Encounter: Payer: Self-pay | Admitting: Family Medicine

## 2023-02-15 ENCOUNTER — Other Ambulatory Visit: Payer: Self-pay | Admitting: Family Medicine

## 2023-02-16 NOTE — Telephone Encounter (Signed)
Refill sent.

## 2023-02-21 NOTE — Assessment & Plan Note (Signed)
hgba1c unacceptable, minimize simple carbs. Increase exercise as tolerated. Continue current meds

## 2023-02-21 NOTE — Assessment & Plan Note (Signed)
Tolerating statin, encouraged heart healthy diet, avoid trans fats, minimize simple carbs and saturated fats. Increase exercise as tolerated 

## 2023-02-21 NOTE — Assessment & Plan Note (Signed)
Well controlled, no changes to meds. Encouraged heart healthy diet such as the DASH diet and exercise as tolerated.  °

## 2023-02-21 NOTE — Assessment & Plan Note (Signed)
Hydrate and monitor 

## 2023-02-22 ENCOUNTER — Telehealth: Payer: No Typology Code available for payment source | Admitting: Family Medicine

## 2023-02-22 ENCOUNTER — Encounter: Payer: Self-pay | Admitting: Family Medicine

## 2023-02-22 DIAGNOSIS — E782 Mixed hyperlipidemia: Secondary | ICD-10-CM

## 2023-02-22 DIAGNOSIS — R252 Cramp and spasm: Secondary | ICD-10-CM | POA: Diagnosis not present

## 2023-02-22 DIAGNOSIS — I10 Essential (primary) hypertension: Secondary | ICD-10-CM

## 2023-02-22 DIAGNOSIS — E103292 Type 1 diabetes mellitus with mild nonproliferative diabetic retinopathy without macular edema, left eye: Secondary | ICD-10-CM | POA: Diagnosis not present

## 2023-02-22 DIAGNOSIS — M79672 Pain in left foot: Secondary | ICD-10-CM

## 2023-02-22 NOTE — Progress Notes (Signed)
MyChart Video Visit    Virtual Visit via Video Note   This patient is at least at moderate risk for complications without adequate follow up. This format is felt to be most appropriate for this patient at this time. Physical exam was limited by quality of the video and audio technology used for the visit. Juanetta, CMA was able to get the patient set up on a video visit.  Patient location: Home Patient and provider in visit Provider location: Office  I discussed the limitations of evaluation and management by telemedicine and the availability of in person appointments. The patient expressed understanding and agreed to proceed.  Visit Date: 02/22/2023  Today's healthcare provider: Danise Edge, MD     Subjective:    Patient ID: Scott Adkins, male    DOB: 09/01/1960, 62 y.o.   MRN: [id number]  No chief complaint on file.   HPI Discussed the use of AI scribe software for clinical note transcription with the patient, who gave verbal consent to proceed.  History of Present Illness   The patient, with a history of diabetes and a recent work-related shoulder injury, reports persistent shoulder pain. Despite the discomfort, the patient has regained full motion in the arm and the pain has lessened compared to before the accident. The patient has decided not to pursue a worker's compensation claim for the injury at this time.  The patient's blood sugar levels have been unusually high this week, reaching the 180s, compared to their usual range of 120s to 140s. The patient cannot identify any specific changes in diet, stress, or illness that might account for this increase.  The patient also reports a recent incident at work where they stepped on an airline and felt something give in their left foot. The pain has persisted, and the patient suspects a possible fracture. The pain is located on the outside of the left foot, towards the top near the ankle. The ankle is also swollen.         Past Medical History:  Diagnosis Date   Anemia 12/22/2015   Arthritis    Chicken pox as a child   Constipation 12/22/2016   Diabetes mellitus type I (HCC)    uses insulin pump// managed by Dr. 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   Hyperlipidemia    Hypertension    Injury of right rotator cuff 08/16/2012   Has been seen by [facility]   Left flank pain 09/12/2015   Mumps as a child   Pain in joint, lower leg 01/24/2016   Preventative health care 02/19/2013   Sun-damaged skin 12/06/2015    Past Surgical History:  Procedure Laterality Date   insulin pump     placed and removed   REFRACTIVE SURGERY Right 2007   [facility]   ROTATOR CUFF REPAIR  2004   left    Family History  Problem Relation Age of Onset   Hypertension Mother    Stroke Father    Hypertension Father    Hyperlipidemia Father    Hypertension Brother    Hyperlipidemia Brother    Mental illness Brother        depression   Heart disease Maternal Grandmother        MI   Heart disease Maternal Grandfather        MI   Hyperlipidemia Brother    Hypertension Brother    Colon cancer Neg Hx    Prostate cancer  Neg Hx    Breast cancer Neg Hx    Rectal cancer Neg Hx    Diabetes Neg Hx     Social History   Socioeconomic History   Marital status: Married    Spouse name: Not on file   Number of children: 3   Years of education: Not on file   Highest education level: 12th grade  Occupational History   Occupation: welder  Tobacco Use   Smoking status: Never   Smokeless tobacco: Never  Substance and Sexual Activity   Alcohol use: No    Alcohol/week: 1.0 standard drink of alcohol    Types: 1 Shots of liquor per week   Drug use: No   Sexual activity: Not on file    Comment: lives with wife and mother in law, no dietary restrictions  Other Topics Concern   Not on file  Social History Narrative   Married 14 yrs   2 daughters; 1 son    Occupation: Psychologist, occupational      Social Determinants of Corporate investment banker Strain: High Risk (11/17/2022)   Received from [facility]   Overall Financial Resource Strain (CARDIA)    Difficulty of Paying Living Expenses: Hard  Food Insecurity: Food Insecurity Present (11/17/2022)   Received from [facility]   Hunger Vital Sign    Worried About Running Out of Food in the Last Year: Sometimes true    Ran Out of Food in the Last Year: Patient declined  Transportation Needs: No Transportation Needs (11/17/2022)   Received from [facility] - Transportation    Lack of Transportation (Medical): No    Lack of Transportation (Non-Medical): No  Physical Activity: Sufficiently Active (11/17/2022)   Received from [facility]   Exercise Vital Sign    Days of Exercise per Week: 7 days    Minutes of Exercise per Session: 30 min  Stress: Stress Concern Present (11/17/2022)   Received from [facility] - Occupational Stress Questionnaire    Feeling of Stress : To some extent  Social Connections: Socially Integrated (11/17/2022)   Received from [facility]   Social Network    How would you rate your social network (family, work, friends)?: Good participation with social networks  Intimate Partner Violence: Not At Risk (11/17/2022)   Received from [facility]   HITS    Over the last 12 months how often did your partner physically hurt you?: Never    Over the last 12 months how often did your partner insult you or talk down to you?: Never    Over the last 12 months how often did your partner threaten you with physical harm?: Never    Over the last 12 months how often did your partner scream or curse at you?: Never    Outpatient Medications Prior to Visit  Medication Sig Dispense Refill   aspirin 81 MG tablet Take 81 mg by mouth daily.     atorvastatin (LIPITOR) 20 MG tablet Take 1 tablet (20 mg total) by mouth daily. 90 tablet 1    Cholecalciferol (VITAMIN D3) 1000 UNITS CAPS Take 2,000 Units by mouth daily.     Continuous Glucose Receiver (DEXCOM G7 RECEIVER) DEVI Use with sensors to check blood glucose (back up or cell phone) 1 each 0   Continuous Glucose Sensor (DEXCOM G7 SENSOR) MISC USE 1 SENSOR  TO CHECK BLOOD SUGAR CONTINUOUSLY  AND CHANGE EVERY 10 DAYS. 3 each 3  fluticasone (FLONASE) 50 MCG/ACT nasal spray Place 2 sprays into both nostrils daily. 16 g 1   hydrochlorothiazide (HYDRODIURIL) 25 MG tablet Take 1 tablet (25 mg total) by mouth daily. 90 tablet 1   insulin detemir (LEVEMIR) 100 UNIT/ML injection INJECT 0.2 MLS (20 UNITS TOTAL) INTO THE SKIN DAILY. 20 mL 2   Insulin Pen Needle (PEN NEEDLES) 32G X 4 MM MISC Use as direct once 500 each 1   NOVOLOG 100 UNIT/ML injection Inject 15 Units into the skin 4 (four) times daily as needed for high blood sugar. 20 mL 5   sertraline (ZOLOFT) 50 MG tablet Take 1 tablet (50 mg total) by mouth daily. 90 tablet 1   valsartan (DIOVAN) 40 MG tablet Take 2 tablets (80 mg total) by mouth daily. 180 tablet 1   No facility-administered medications prior to visit.    Allergies  Allergen Reactions   Humalog [Insulin Lispro] Other (See Comments)    Sugar drops dangerously low    Lisinopril     Hyperkalemia    Viagra [Sildenafil Citrate] Other (See Comments)    headache    Review of Systems  Constitutional:  Negative for fever and malaise/fatigue.  HENT:  Negative for congestion.   Eyes:  Negative for blurred vision.  Respiratory:  Negative for shortness of breath.   Cardiovascular:  Negative for chest pain, palpitations and leg swelling.  Gastrointestinal:  Negative for abdominal pain, blood in stool and nausea.  Genitourinary:  Negative for dysuria and frequency.  Musculoskeletal:  Positive for joint pain. Negative for falls.  Skin:  Negative for rash.  Neurological:  Negative for dizziness, loss of consciousness and headaches.  Endo/Heme/Allergies:  Negative for  environmental allergies.  Psychiatric/Behavioral:  Negative for depression. The patient is not nervous/anxious.        Objective:    Physical Exam Constitutional:      General: He is not in acute distress.    Appearance: Normal appearance. He is not ill-appearing or toxic-appearing.  HENT:     Head: Normocephalic and atraumatic.     Right Ear: External ear normal.     Left Ear: External ear normal.     Nose: Nose normal.  Eyes:     General:        Right eye: No discharge.        Left eye: No discharge.  Pulmonary:     Effort: Pulmonary effort is normal.  Skin:    Findings: No rash.  Neurological:     Mental Status: He is alert and oriented to person, place, and time.  Psychiatric:        Behavior: Behavior normal.     There were no vitals taken for this visit. 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 Increase exercise as tolerated. Continue current meds   Orders: -     Hemoglobin A1c; Future -     Microalbumin / creatinine urine ratio; Future  Essential hypertension Assessment & Plan: Well controlled, no changes to meds. Encouraged heart healthy diet such as the DASH diet and exercise as tolerated.    Orders: -     CBC with Differential/Platelet; Future -     Comprehensive metabolic  panel; Future -     TSH; Future  Mixed hyperlipidemia Assessment & Plan: Tolerating statin, encouraged heart healthy diet, avoid trans fats, minimize simple carbs and saturated fats. Increase exercise as tolerated  Orders: -     Lipid panel; Future  Muscle cramping Assessment & Plan: Hydrate and monitor    Left foot pain -     DG Foot Complete Left; Future     Assessment and Plan    Left  Shoulder Pain Improvement in pain and range of motion since work-related injury. No current plan for legal action against workman's comp denial. -Continue current management and monitor for any changes.  Left Foot Pain Recent onset of pain and swelling on the outside of the left foot, possibly related to a work incident. No current imaging or formal evaluation. -Order X-ray of left foot to rule out fracture. -Consider use of topical ice and lidocaine gel for pain management.  Hyperglycemia Recent increase in blood glucose levels, with readings in the 180s. No clear precipitating factors identified. -Monitor blood glucose levels closely. -Consider potential need for adjustment in diabetes management.  General Health Maintenance -Plan to receive COVID and flu boosters at pharmacy. -Schedule lab work and follow-up appointments with nurse practitioner Ladona Ridgel in February and with primary provider in six months.         I discussed the assessment and treatment plan with the patient. The patient was provided an opportunity to ask questions and all were answered. The patient agreed with the plan and demonstrated an understanding of the instructions.   The patient was advised to call back or seek an in-person evaluation if the symptoms worsen or if the condition fails to improve as anticipated.  Danise Edge, MD [address] [facility] at [facility] [phone] (phone) [phone] (fax)  [facility]

## 2023-02-23 ENCOUNTER — Other Ambulatory Visit (INDEPENDENT_AMBULATORY_CARE_PROVIDER_SITE_OTHER): Payer: No Typology Code available for payment source

## 2023-02-23 DIAGNOSIS — E782 Mixed hyperlipidemia: Secondary | ICD-10-CM

## 2023-02-23 DIAGNOSIS — I10 Essential (primary) hypertension: Secondary | ICD-10-CM | POA: Diagnosis not present

## 2023-02-23 DIAGNOSIS — E103292 Type 1 diabetes mellitus with mild nonproliferative diabetic retinopathy without macular edema, left eye: Secondary | ICD-10-CM | POA: Diagnosis not present

## 2023-02-24 LAB — CBC WITH DIFFERENTIAL/PLATELET
Basophils Absolute: 0.1 10*3/uL (ref 0.0–0.1)
Basophils Relative: 1.1 % (ref 0.0–3.0)
Eosinophils Absolute: 0.1 10*3/uL (ref 0.0–0.7)
Eosinophils Relative: 1.4 % (ref 0.0–5.0)
HCT: 40.9 % (ref 39.0–52.0)
Hemoglobin: 13.5 g/dL (ref 13.0–17.0)
Lymphocytes Relative: 20.8 % (ref 12.0–46.0)
Lymphs Abs: 1.8 10*3/uL (ref 0.7–4.0)
MCHC: 33 g/dL (ref 30.0–36.0)
MCV: 89.6 fL (ref 78.0–100.0)
Monocytes Absolute: 0.8 10*3/uL (ref 0.1–1.0)
Monocytes Relative: 9.8 % (ref 3.0–12.0)
Neutro Abs: 5.8 10*3/uL (ref 1.4–7.7)
Neutrophils Relative %: 66.9 % (ref 43.0–77.0)
Platelets: 219 10*3/uL (ref 150.0–400.0)
RBC: 4.56 Mil/uL (ref 4.22–5.81)
RDW: 12.6 % (ref 11.5–15.5)
WBC: 8.6 10*3/uL (ref 4.0–10.5)

## 2023-02-24 LAB — COMPREHENSIVE METABOLIC PANEL
ALT: 19 U/L (ref 0–53)
AST: 21 U/L (ref 0–37)
Albumin: 4.2 g/dL (ref 3.5–5.2)
Alkaline Phosphatase: 81 U/L (ref 39–117)
BUN: 29 mg/dL — ABNORMAL HIGH (ref 6–23)
CO2: 32 meq/L (ref 19–32)
Calcium: 9.8 mg/dL (ref 8.4–10.5)
Chloride: 101 meq/L (ref 96–112)
Creatinine, Ser: 1.04 mg/dL (ref 0.40–1.50)
GFR: 76.88 mL/min (ref >60.00)
Glucose, Bld: 97 mg/dL (ref 70–99)
Potassium: 4.9 meq/L (ref 3.5–5.1)
Sodium: 139 meq/L (ref 135–145)
Total Bilirubin: 0.4 mg/dL (ref 0.2–1.2)
Total Protein: 6.7 g/dL (ref 6.0–8.3)

## 2023-02-24 LAB — HEMOGLOBIN A1C: Hgb A1c MFr Bld: 7.7 % — ABNORMAL HIGH (ref 4.6–6.5)

## 2023-02-24 LAB — LIPID PANEL
Cholesterol: 288 mg/dL — ABNORMAL HIGH (ref 0–200)
HDL: 47.7 mg/dL (ref >39.00)
LDL Cholesterol: 198 mg/dL — ABNORMAL HIGH (ref 0–99)
NonHDL: 240.3
Total CHOL/HDL Ratio: 6
Triglycerides: 212 mg/dL — ABNORMAL HIGH (ref 0.0–149.0)
VLDL: 42.4 mg/dL — ABNORMAL HIGH (ref 0.0–40.0)

## 2023-02-24 LAB — TSH: TSH: 2.66 u[IU]/mL (ref 0.35–5.50)

## 2023-02-24 LAB — MICROALBUMIN / CREATININE URINE RATIO
Creatinine,U: 157.7 mg/dL
Microalb Creat Ratio: 0.7 mg/g (ref 0.0–30.0)
Microalb, Ur: 1.1 mg/dL (ref 0.0–1.9)

## 2023-02-25 ENCOUNTER — Encounter: Payer: Self-pay | Admitting: Emergency Medicine

## 2023-02-25 ENCOUNTER — Other Ambulatory Visit: Payer: Self-pay | Admitting: Emergency Medicine

## 2023-02-25 MED ORDER — ATORVASTATIN CALCIUM 20 MG PO TABS: 40.0000 mg | ORAL_TABLET | Freq: Every day | ORAL | 3 refills | Status: DC

## 2023-02-26 ENCOUNTER — Other Ambulatory Visit: Payer: Self-pay | Admitting: *Deleted

## 2023-02-26 MED ORDER — ATORVASTATIN CALCIUM 40 MG PO TABS: 40.0000 mg | ORAL_TABLET | Freq: Every day | ORAL | 1 refills | Status: DC

## 2023-03-05 ENCOUNTER — Other Ambulatory Visit: Payer: Self-pay | Admitting: Family

## 2023-03-07 ENCOUNTER — Encounter: Payer: Self-pay | Admitting: Family Medicine

## 2023-03-08 ENCOUNTER — Other Ambulatory Visit: Payer: Self-pay | Admitting: Family Medicine

## 2023-03-08 ENCOUNTER — Other Ambulatory Visit: Payer: Self-pay | Admitting: Emergency Medicine

## 2023-03-08 DIAGNOSIS — E103292 Type 1 diabetes mellitus with mild nonproliferative diabetic retinopathy without macular edema, left eye: Secondary | ICD-10-CM

## 2023-03-08 MED ORDER — DEXCOM G7 SENSOR MISC: 3 refills | Status: DC

## 2023-03-10 ENCOUNTER — Other Ambulatory Visit: Payer: Self-pay | Admitting: Emergency Medicine

## 2023-03-10 DIAGNOSIS — E103292 Type 1 diabetes mellitus with mild nonproliferative diabetic retinopathy without macular edema, left eye: Secondary | ICD-10-CM

## 2023-03-10 MED ORDER — DEXCOM G7 SENSOR MISC: 3 refills | Status: DC

## 2023-04-04 ENCOUNTER — Other Ambulatory Visit: Payer: Self-pay | Admitting: Family Medicine

## 2023-04-26 ENCOUNTER — Other Ambulatory Visit: Payer: Self-pay | Admitting: Family Medicine

## 2023-04-29 ENCOUNTER — Telehealth: Payer: Self-pay | Admitting: Family Medicine

## 2023-04-29 ENCOUNTER — Other Ambulatory Visit: Payer: Self-pay | Admitting: Family Medicine

## 2023-04-29 ENCOUNTER — Other Ambulatory Visit: Payer: Self-pay | Admitting: Family

## 2023-04-29 DIAGNOSIS — E103292 Type 1 diabetes mellitus with mild nonproliferative diabetic retinopathy without macular edema, left eye: Secondary | ICD-10-CM

## 2023-04-29 MED ORDER — LANTUS SOLOSTAR 100 UNIT/ML ~~LOC~~ SOPN: 20.0000 [IU] | PEN_INJECTOR | Freq: Every day | SUBCUTANEOUS | 3 refills | Status: DC

## 2023-04-29 MED ORDER — INSULIN GLARGINE 100 UNIT/ML ~~LOC~~ SOLN: 20.0000 [IU] | Freq: Every day | SUBCUTANEOUS | 5 refills | Status: DC

## 2023-04-29 MED ORDER — NOVOLOG 100 UNIT/ML IJ SOLN: 15.0000 [IU] | Freq: Four times a day (QID) | INTRAMUSCULAR | 5 refills | Status: DC | PRN

## 2023-04-29 NOTE — Telephone Encounter (Addendum)
Spoke with patient and advised that [facility] sent in Lantus in and he stated that he was allergic to.  I asked what happens when he takes it and he stated that his sugars go too low.  Advised patient that we did not have that on file anywhere and that we just have the Humalog on the allergy list.  He is willing to change to something else other than the Lantus, since the Levemir is no longer available.  Per Dr. Abner Greenspan ok to send in [place].  Patient notified of change and he also needed refill for Novolog.

## 2023-04-30 ENCOUNTER — Other Ambulatory Visit: Payer: Self-pay | Admitting: Family Medicine

## 2023-04-30 MED ORDER — BASAGLAR KWIKPEN 100 UNIT/ML ~~LOC~~ SOPN: 20.0000 [IU] | PEN_INJECTOR | Freq: Every day | SUBCUTANEOUS | 5 refills | Status: DC

## 2023-04-30 NOTE — Telephone Encounter (Signed)
PA initiated via Covermymeds; KEY: [id number]. Awaiting determination.

## 2023-04-30 NOTE — Telephone Encounter (Signed)
Spoke with pharmacy and they stated that Scott Adkins is available.  When they tried to run through ins it had a communication error, which means they need to run back through when ins is back up.  Also spoke with ins and Mariella Saa is covered.  The other alternatives is Lantus (which pt says he cannot take) and [employer].  Called and spoke with patient and advised him to call pharmacy later to have them rerun med back thru ins.

## 2023-04-30 NOTE — Telephone Encounter (Signed)
Rx changed to [employer]. Pt notified.  Hopefully this on will be covered.

## 2023-04-30 NOTE — Telephone Encounter (Deleted)
Copied from CRM [id number]. Topic: Clinical - Medication Question >> Apr 30, 2023  1:17 PM Leavy Cella D wrote: Reason for CRM: Patient called in would like someone to call out to his insurance company to see which insulin is covered under his plan . Patient would like a call back with information

## 2023-04-30 NOTE — Telephone Encounter (Signed)
Copied from CRM [id number]. Topic: Clinical - Medication Question >> Apr 30, 2023  1:17 PM Leavy Cella D wrote: Reason for CRM: Patient called in would like someone to call out to his insurance company to see which insulin is covered under his plan . Patient would like a call back with information

## 2023-04-30 NOTE — Telephone Encounter (Signed)
PA denied.   The denial was based on our criteria for [place] Inj 100u/Ml.  The requested drug is not covered by the plan. Please contact member services for further assistance. Reviewed by: R.Ph

## 2023-05-10 ENCOUNTER — Encounter: Payer: Self-pay | Admitting: Family Medicine

## 2023-05-10 ENCOUNTER — Ambulatory Visit (INDEPENDENT_AMBULATORY_CARE_PROVIDER_SITE_OTHER): Payer: No Typology Code available for payment source | Admitting: Family Medicine

## 2023-05-10 ENCOUNTER — Encounter: Payer: No Typology Code available for payment source | Admitting: Family Medicine

## 2023-05-10 VITALS — BP 133/75 | HR 58 | Ht 67.0 in | Wt 212.0 lb

## 2023-05-10 DIAGNOSIS — E782 Mixed hyperlipidemia: Secondary | ICD-10-CM

## 2023-05-10 DIAGNOSIS — Z0000 Encounter for general adult medical examination without abnormal findings: Secondary | ICD-10-CM | POA: Diagnosis not present

## 2023-05-10 DIAGNOSIS — I10 Essential (primary) hypertension: Secondary | ICD-10-CM | POA: Diagnosis not present

## 2023-05-10 DIAGNOSIS — E103292 Type 1 diabetes mellitus with mild nonproliferative diabetic retinopathy without macular edema, left eye: Secondary | ICD-10-CM | POA: Diagnosis not present

## 2023-05-10 NOTE — Progress Notes (Signed)
Complete physical exam  Patient: Scott Adkins   DOB: [birth year]   63 y.o. Male  MRN: [id number]  Subjective:    Chief Complaint  Patient presents with   Annual Exam    Scott Adkins is a 63 y.o. male who presents today for a complete physical exam. He reports consuming a  general  diet. Home exercise routine includes walking his dog about 6 times per day. He generally feels fairly well. He reports sleeping fairly well. He does not have additional problems to discuss today.   Currently lives with: wife Acute concerns or interim problems since last visit: blood sugars are fluctuating, but he is not restricting diet at all  Vision concerns: no Dental concerns: no STD concerns: no  ETOH use: rarely Nicotine use: no Recreational drugs/illegal substances: no   Discussed the use of AI scribe software for clinical note transcription with the patient, who gave verbal consent to proceed.  History of Present Illness   The patient, with diabetes, presents with poorly controlled blood sugar levels.  He has experienced poorly controlled blood sugar levels since a change in his diabetes medication from Levemir to [employer]. His fasting blood sugar levels have increased from around 100 mg/dL to approximately 119 mg/dL since the switch. He uses NovoLog with meals, calculating doses based on carbohydrate intake, averaging around 14 units per meal. He finds it challenging to adhere to a low-carb diet due to concerns about hypoglycemia affecting his work.  He experiences significant drops in blood sugar, particularly after taking the prescribed 20 units of Basaglar, which led to a severe hypoglycemic episode requiring multiple interventions. To prevent such episodes, especially while driving, he has reduced his Basaglar dose to 12 units.  He walks his dog multiple times a day, which he considers his primary form of exercise. He lives with his wife, does not smoke or use nicotine products, and consumes  alcohol very rarely. No vision or dental concerns.         Most recent fall risk assessment:    05/10/2023    9:14 AM  Fall Risk   Falls in the past year? 0  Number falls in past yr: 0  Injury with Fall? 0  Risk for fall due to : No Fall Risks  Follow up Falls evaluation completed     Most recent depression screenings:    05/10/2023    9:14 AM 11/09/2022   11:16 AM  PHQ 2/9 Scores  PHQ - 2 Score 2 0  PHQ- 9 Score 10        05/10/2023    9:15 AM 07/27/2022    2:44 PM  GAD 7 : Generalized Anxiety Score  Nervous, Anxious, on Edge 0 0  Control/stop worrying 0 0  Worry too much - different things 0 0  Trouble relaxing 1 0  Restless 0 0  Easily annoyed or irritable 0 0  Afraid - awful might happen 0 0  Total GAD 7 Score 1 0  Anxiety Difficulty Not difficult at all Not difficult at all    No SI/HI. States his biggest issue is with sleep, but this is chronic and he does not desire additional intervention at this time.         Patient Care Team: Bradd Canary, MD as PCP - General (Family Medicine)   Outpatient Medications Prior to Visit  Medication Sig   aspirin 81 MG tablet Take 81 mg by mouth daily.   atorvastatin (LIPITOR) 40 MG tablet Take  1 tablet (40 mg total) by mouth daily.   Cholecalciferol (VITAMIN D3) 1000 UNITS CAPS Take 2,000 Units by mouth daily.   Continuous Glucose Receiver (DEXCOM G7 RECEIVER) DEVI Use with sensors to check blood glucose (back up or cell phone)   Continuous Glucose Sensor (DEXCOM G7 SENSOR) MISC USE 1 SENSOR  TO CHECK BLOOD SUGAR CONTINUOUSLY  AND CHANGE EVERY 10 DAYS.   fluticasone (FLONASE) 50 MCG/ACT nasal spray Place 2 sprays into both nostrils daily.   hydrochlorothiazide (HYDRODIURIL) 25 MG tablet TAKE 1 TABLET (25 MG TOTAL) BY MOUTH DAILY.   Insulin Glargine (BASAGLAR KWIKPEN) 100 UNIT/ML Inject 20 Units into the skin daily.   Insulin Pen Needle (PEN NEEDLES) 32G X 4 MM MISC Use as direct once   NOVOLOG 100 UNIT/ML injection  Inject 15 Units into the skin 4 (four) times daily as needed for high blood sugar.   sertraline (ZOLOFT) 50 MG tablet TAKE 1 TABLET BY MOUTH EVERY DAY   valsartan (DIOVAN) 40 MG tablet TAKE 2 TABS BY MOUTH ONCE DAILY   No facility-administered medications prior to visit.    ROS All review of systems negative except what is listed in the HPI        Objective:     BP 133/75   Pulse (!) 58   Ht 5\' 7"  (1.702 m)   Wt 212 lb (96.2 kg)   SpO2 100%   BMI 33.20 kg/m    Physical Exam Vitals reviewed.  Constitutional:      General: He is not in acute distress.    Appearance: Normal appearance. He is not ill-appearing.  HENT:     Head: Normocephalic and atraumatic.     Right Ear: Tympanic membrane normal.     Left Ear: Tympanic membrane normal.     Nose: Nose normal.     Mouth/Throat:     Mouth: Mucous membranes are moist.     Pharynx: Oropharynx is clear.  Eyes:     Extraocular Movements: Extraocular movements intact.     Conjunctiva/sclera: Conjunctivae normal.     Pupils: Pupils are equal, round, and reactive to light.  Neck:     Vascular: No carotid bruit.  Cardiovascular:     Rate and Rhythm: Normal rate and regular rhythm.     Pulses: Normal pulses.     Heart sounds: Normal heart sounds.  Pulmonary:     Effort: Pulmonary effort is normal.     Breath sounds: Normal breath sounds.  Abdominal:     General: Abdomen is flat. Bowel sounds are normal. There is no distension.     Palpations: Abdomen is soft. There is no mass.     Tenderness: There is no abdominal tenderness. There is no right CVA tenderness, left CVA tenderness, guarding or rebound.  Genitourinary:    Comments: Deferred exam Musculoskeletal:        General: Normal range of motion.     Cervical back: Normal range of motion and neck supple. No tenderness.     Right lower leg: No edema.     Left lower leg: No edema.  Lymphadenopathy:     Cervical: No cervical adenopathy.  Skin:    General: Skin is  warm and dry.     Capillary Refill: Capillary refill takes less than 2 seconds.  Neurological:     General: No focal deficit present.     Mental Status: He is alert and oriented to person, place, and time. Mental status is at baseline.  Psychiatric:  Mood and Affect: Mood normal.        Behavior: Behavior normal.        Thought Content: Thought content normal.        Judgment: Judgment normal.          No results found for any visits on 05/10/23.     Assessment & Plan:    Routine Health Maintenance and Physical Exam Discussed health promotion and safety including diet and exercise recommendations, dental health, and injury prevention. Tobacco cessation if applicable. Seat belts, sunscreen, smoke detectors, etc.    Immunization History  Administered Date(s) Administered   19-influenza Whole 12/08/2016   Influenza Inj Mdck Quad Pf 01/25/2019   Influenza Split 12/05/2012   Influenza Whole 03/19/2008, 12/27/2009   Influenza,inj,Quad PF,6+ Mos 02/15/2015, 01/23/2016, 01/13/2018, 01/14/2021   Influenza-Unspecified 01/24/2014, 01/05/2022   PFIZER(Purple Top)SARS-COV-2 Vaccination 08/04/2019, 08/25/2019, 03/05/2020   Pneumococcal Conjugate-13 02/15/2015   Pneumococcal Polysaccharide-23 03/19/2008, 01/23/2016   Tdap 08/14/2013, 12/08/2019   Zoster Recombinant(Shingrix) 05/12/2021, 11/11/2021    Health Maintenance  Topic Date Due   Colonoscopy  12/24/2021   FOOT EXAM  01/14/2022   OPHTHALMOLOGY EXAM  05/02/2022   INFLUENZA VACCINE  07/05/2023 (Originally 11/05/2022)   HEMOGLOBIN A1C  08/23/2023   Diabetic kidney evaluation - eGFR measurement  02/23/2024   Diabetic kidney evaluation - Urine ACR  02/23/2024   Pneumococcal Vaccine 28-70 Years old (3 of 3 - PPSV23 or PCV20) 06/28/2025   DTaP/Tdap/Td (3 - Td or Tdap) 12/07/2029   Hepatitis C Screening  Completed   HIV Screening  Completed   Zoster Vaccines- Shingrix  Completed   HPV VACCINES  Aged Out   COVID-19 Vaccine   Discontinued        Problem List Items Addressed This Visit       Active Problems   Hyperlipidemia   Medication management: atorvastatin 40 mg daily Lifestyle factors for lowering cholesterol include: Diet therapy - heart-healthy diet rich in fruits, veggies, fiber-rich whole grains, lean meats, chicken, fish (at least twice a week), fat-free or 1% dairy products; foods low in saturated/trans fats, cholesterol, sodium, and sugar. Mediterranean diet has shown to be very heart healthy. Regular exercise - recommend at least 30 minutes a day, 5 times per week Weight management  Repeat CMP and lipid panel        Relevant Orders   Comprehensive metabolic panel   Lipid panel   Essential hypertension   Blood pressure is at goal for age and co-morbidities.   Recommendations: continue HCTZ AND VALSARTAN - BP goal <130/80 - monitor and log blood pressures at home - check around the same time each day in a relaxed setting - Limit salt to <2000 mg/day - Follow DASH eating plan (heart healthy diet) - limit alcohol to 2 standard drinks per day for men and 1 per day for women - avoid tobacco products - get at least 2 hours of regular aerobic exercise weekly Patient aware of signs/symptoms requiring further/urgent evaluation. Labs ordered       Relevant Orders   Comprehensive metabolic panel   TSH   DM (diabetes mellitus), type 1 with ophthalmic complications (HCC)   Poor control with recent medication change from Levemir to [employer]. Patient reports hypoglycemic episodes with prescribed dose of 20 units daily, has self-adjusted to 12 units daily. Also taking NovoLog with meals, approximately 14 units per meal. -Continue Basaglar at 12 units daily and NovoLog with meals. -Encourage patient to maintain a low-carb diet and pair carbs with  proteins. Provided nutrition handout. -Check labs in about two weeks (too early for A1c today) -Consider referral if no improvement in glycemic  control.      Relevant Orders   CBC with Differential/Platelet   Comprehensive metabolic panel   Hemoglobin A1c   Lipid panel   TSH   Other Visit Diagnoses       Annual physical exam    -  Primary   Relevant Orders   CBC with Differential/Platelet   Comprehensive metabolic panel   Hemoglobin A1c   Lipid panel   TSH      Return for lab appt around 05/26/23, keep routine f/u with PCP in May.     Clayborne Dana, NP

## 2023-05-10 NOTE — Patient Instructions (Signed)

## 2023-05-10 NOTE — Assessment & Plan Note (Signed)
Medication management: atorvastatin 40 mg daily Lifestyle factors for lowering cholesterol include: Diet therapy - heart-healthy diet rich in fruits, veggies, fiber-rich whole grains, lean meats, chicken, fish (at least twice a week), fat-free or 1% dairy products; foods low in saturated/trans fats, cholesterol, sodium, and sugar. Mediterranean diet has shown to be very heart healthy. Regular exercise - recommend at least 30 minutes a day, 5 times per week Weight management  Repeat CMP and lipid panel

## 2023-05-10 NOTE — Assessment & Plan Note (Signed)
Blood pressure is at goal for age and co-morbidities.   Recommendations: continue HCTZ AND VALSARTAN - BP goal <130/80 - monitor and log blood pressures at home - check around the same time each day in a relaxed setting - Limit salt to <2000 mg/day - Follow DASH eating plan (heart healthy diet) - limit alcohol to 2 standard drinks per day for men and 1 per day for women - avoid tobacco products - get at least 2 hours of regular aerobic exercise weekly Patient aware of signs/symptoms requiring further/urgent evaluation. Labs ordered

## 2023-05-10 NOTE — Assessment & Plan Note (Signed)
Poor control with recent medication change from Levemir to [employer]. Patient reports hypoglycemic episodes with prescribed dose of 20 units daily, has self-adjusted to 12 units daily. Also taking NovoLog with meals, approximately 14 units per meal. -Continue Basaglar at 12 units daily and NovoLog with meals. -Encourage patient to maintain a low-carb diet and pair carbs with proteins. Provided nutrition handout. -Check labs in about two weeks (too early for A1c today) -Consider referral if no improvement in glycemic control.

## 2023-05-26 ENCOUNTER — Other Ambulatory Visit: Payer: No Typology Code available for payment source

## 2023-05-28 ENCOUNTER — Other Ambulatory Visit: Payer: No Typology Code available for payment source

## 2023-05-28 ENCOUNTER — Encounter: Payer: Self-pay | Admitting: Family Medicine

## 2023-05-28 MED ORDER — DEXCOM G6 RECEIVER DEVI: 0 refills | Status: DC

## 2023-05-28 MED ORDER — DEXCOM G6 SENSOR MISC: 3 refills | Status: DC

## 2023-05-28 MED ORDER — DEXCOM G6 TRANSMITTER MISC: 0 refills | Status: DC

## 2023-05-31 ENCOUNTER — Other Ambulatory Visit (INDEPENDENT_AMBULATORY_CARE_PROVIDER_SITE_OTHER): Payer: No Typology Code available for payment source

## 2023-05-31 DIAGNOSIS — Z0000 Encounter for general adult medical examination without abnormal findings: Secondary | ICD-10-CM | POA: Diagnosis not present

## 2023-05-31 DIAGNOSIS — E782 Mixed hyperlipidemia: Secondary | ICD-10-CM

## 2023-05-31 DIAGNOSIS — E103292 Type 1 diabetes mellitus with mild nonproliferative diabetic retinopathy without macular edema, left eye: Secondary | ICD-10-CM | POA: Diagnosis not present

## 2023-05-31 DIAGNOSIS — I10 Essential (primary) hypertension: Secondary | ICD-10-CM

## 2023-06-01 ENCOUNTER — Encounter: Payer: Self-pay | Admitting: Family Medicine

## 2023-06-01 LAB — COMPREHENSIVE METABOLIC PANEL
ALT: 25 U/L (ref 0–53)
AST: 24 U/L (ref 0–37)
Albumin: 4.4 g/dL (ref 3.5–5.2)
Alkaline Phosphatase: 82 U/L (ref 39–117)
BUN: 25 mg/dL — ABNORMAL HIGH (ref 6–23)
CO2: 32 meq/L (ref 19–32)
Calcium: 9.5 mg/dL (ref 8.4–10.5)
Chloride: 101 meq/L (ref 96–112)
Creatinine, Ser: 1.05 mg/dL (ref 0.40–1.50)
GFR: 75.86 mL/min (ref >60.00)
Glucose, Bld: 176 mg/dL — ABNORMAL HIGH (ref 70–99)
Potassium: 5.1 meq/L (ref 3.5–5.1)
Sodium: 141 meq/L (ref 135–145)
Total Bilirubin: 0.6 mg/dL (ref 0.2–1.2)
Total Protein: 7.1 g/dL (ref 6.0–8.3)

## 2023-06-01 LAB — CBC WITH DIFFERENTIAL/PLATELET
Basophils Absolute: 0 10*3/uL (ref 0.0–0.1)
Basophils Relative: 0.6 % (ref 0.0–3.0)
Eosinophils Absolute: 0.1 10*3/uL (ref 0.0–0.7)
Eosinophils Relative: 0.9 % (ref 0.0–5.0)
HCT: 42.1 % (ref 39.0–52.0)
Hemoglobin: 13.7 g/dL (ref 13.0–17.0)
Lymphocytes Relative: 20.4 % (ref 12.0–46.0)
Lymphs Abs: 1.6 10*3/uL (ref 0.7–4.0)
MCHC: 32.5 g/dL (ref 30.0–36.0)
MCV: 90.8 fl (ref 78.0–100.0)
Monocytes Absolute: 0.7 10*3/uL (ref 0.1–1.0)
Monocytes Relative: 8.9 % (ref 3.0–12.0)
Neutro Abs: 5.5 10*3/uL (ref 1.4–7.7)
Neutrophils Relative %: 69.2 % (ref 43.0–77.0)
Platelets: 198 10*3/uL (ref 150.0–400.0)
RBC: 4.64 Mil/uL (ref 4.22–5.81)
RDW: 12.9 % (ref 11.5–15.5)
WBC: 8 10*3/uL (ref 4.0–10.5)

## 2023-06-01 LAB — LIPID PANEL
Cholesterol: 134 mg/dL (ref 0–200)
HDL: 51.7 mg/dL (ref >39.00)
LDL Cholesterol: 67 mg/dL (ref 0–99)
NonHDL: 82.33
Total CHOL/HDL Ratio: 3
Triglycerides: 75 mg/dL (ref 0.0–149.0)
VLDL: 15 mg/dL (ref 0.0–40.0)

## 2023-06-01 LAB — HEMOGLOBIN A1C: Hgb A1c MFr Bld: 7.4 % — ABNORMAL HIGH (ref 4.6–6.5)

## 2023-06-01 LAB — TSH: TSH: 3.52 u[IU]/mL (ref 0.35–5.50)

## 2023-06-01 MED ORDER — DEXCOM G6 TRANSMITTER MISC: 0 refills | Status: DC

## 2023-06-01 MED ORDER — DEXCOM G6 SENSOR MISC: 3 refills | Status: DC

## 2023-06-01 MED ORDER — DEXCOM G6 RECEIVER DEVI: 0 refills | Status: DC

## 2023-06-01 NOTE — Addendum Note (Signed)
 Addended by: Thelma Barge D on: 06/01/2023 11:25 AM   Modules accepted: Orders

## 2023-06-22 ENCOUNTER — Encounter: Payer: Self-pay | Admitting: Family Medicine

## 2023-06-22 ENCOUNTER — Ambulatory Visit: Admitting: Family Medicine

## 2023-06-22 ENCOUNTER — Ambulatory Visit (INDEPENDENT_AMBULATORY_CARE_PROVIDER_SITE_OTHER)

## 2023-06-22 ENCOUNTER — Ambulatory Visit: Admitting: Emergency Medicine

## 2023-06-22 VITALS — BP 130/76 | HR 56 | Temp 97.5°F | Ht 67.0 in | Wt 209.2 lb

## 2023-06-22 DIAGNOSIS — M79641 Pain in right hand: Secondary | ICD-10-CM

## 2023-06-22 DIAGNOSIS — M19041 Primary osteoarthritis, right hand: Secondary | ICD-10-CM | POA: Diagnosis not present

## 2023-06-22 NOTE — Progress Notes (Signed)
 Musculoskeletal Exam  Patient: Scott Adkins DOB: Aug 20, 1960  DOS: 06/22/2023  SUBJECTIVE:  Chief Complaint:   Chief Complaint  Patient presents with   Hand Pain    Patient presents today for right hand pain. He reports falling in flooring of a house that he was remodeling. Reports hand is swollen.    Scott Adkins is a 62 y.o.  male for evaluation and treatment of R hand pain.   Onset:  3 days ago. Fell through a floor, hit on a piece of wood Location: palm of hand over 3rd digit Character:  aching  Progression of issue:  is unchanged Associated symptoms: swelling No redness, bruising Treatment: to date has been none.   Neurovascular symptoms: no  Past Medical History:  Diagnosis Date   Anemia 12/22/2015   Arthritis    Chicken pox as a child   Constipation 12/22/2016   Diabetes mellitus type I (HCC)    uses insulin pump// managed by Dr. 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   Hyperlipidemia    Hypertension    Injury of right rotator cuff 08/16/2012   Has been seen by [facility]   Left flank pain 09/12/2015   Mumps as a child   Pain in joint, lower leg 01/24/2016   Preventative health care 02/19/2013   Sun-damaged skin 12/06/2015    Objective: VITAL SIGNS: BP 130/76 (BP Location: Left Arm, Cuff Size: Normal)   Pulse (!) 56   Temp (!) 97.5 F (36.4 C)   Ht 5\' 7"  (1.702 m)   Wt 209 lb 3.2 oz (94.9 kg)   SpO2 97%   BMI 32.77 kg/m  Constitutional: Well formed, well developed. No acute distress. Thorax & Lungs: No accessory muscle use Musculoskeletal: R hand.   Pain w movement.  Tenderness to palpation: yes over distal 3rd MC on palmar surface Deformity: no Ecchymosis: no Soft tissue edema noted over area.  Neurologic: Normal sensory function.  Psychiatric: Normal mood. Age appropriate judgment and insight. Alert & oriented x 3.    Assessment:  Right hand pain - Plan: DG Hand Complete  Right  Plan: Stretches/exercises (told not to do until Xray results back), ice, Tylenol.  F/u as originally scheduled. The patient voiced understanding and agreement to the plan.   Jilda Roche Edroy, DO 06/22/23  2:09 PM

## 2023-06-22 NOTE — Patient Instructions (Signed)
 Please get your X-ray done at the [facility] in [place]: [address], [place], [place] [address] [phone]  You do not need an appointment for this location.   Ice/cold pack over area for 10-15 min twice daily.  OK to take Tylenol 1000 mg (2 extra strength tabs) or 975 mg (3 regular strength tabs) every 6 hours as needed.  Send me a message in 1-2 days if you do not hear anything about your X-ray results.   Let us know if you need anything.  Hand Exercises Hand exercises can be helpful for almost anyone. These exercises can strengthen the hands, improve flexibility and movement, and increase blood flow to the hands. These results can make work and daily tasks easier. Hand exercises can be especially helpful for people who have joint pain from arthritis or have nerve damage from overuse (carpal tunnel syndrome). These exercises can also help people who have injured a hand. Exercises Most of these hand exercises are gentle stretching and motion exercises. It is usually safe to do them often throughout the day. Warming up your hands before exercise may help to reduce stiffness. You can do this with gentle massage or by placing your hands in warm water for 10-15 minutes. It is normal to feel some stretching, pulling, tightness, or mild discomfort as you begin new exercises. This will gradually improve. Stop an exercise right away if you feel sudden, severe pain or your pain gets worse. Ask your health care provider which exercises are best for you. Knuckle bend or "claw" fist Stand or sit with your arm, hand, and all five fingers pointed straight up. Make sure to keep your wrist straight during the exercise. Gently bend your fingers down toward your palm until the tips of your fingers are touching the top of your palm. Keep your big knuckle straight and just bend the small knuckles in your fingers. Hold this position for 3 seconds. Straighten (extend) your fingers back to the  starting position. Repeat this exercise 5-10 times with each hand. Full finger fist Stand or sit with your arm, hand, and all five fingers pointed straight up. Make sure to keep your wrist straight during the exercise. Gently bend your fingers into your palm until the tips of your fingers are touching the middle of your palm. Hold this position for 3 seconds. Extend your fingers back to the starting position, stretching every joint fully. Repeat this exercise 5-10 times with each hand. Straight fist Stand or sit with your arm, hand, and all five fingers pointed straight up. Make sure to keep your wrist straight during the exercise. Gently bend your fingers at the big knuckle, where your fingers meet your hand, and the middle knuckle. Keep the knuckle at the tips of your fingers straight and try to touch the bottom of your palm. Hold this position for 3 seconds. Extend your fingers back to the starting position, stretching every joint fully. Repeat this exercise 5-10 times with each hand. Tabletop Stand or sit with your arm, hand, and all five fingers pointed straight up. Make sure to keep your wrist straight during the exercise. Gently bend your fingers at the big knuckle, where your fingers meet your hand, as far down as you can while keeping the small knuckles in your fingers straight. Think of forming a tabletop with your fingers. Hold this position for 3 seconds. Extend your fingers back to the starting position, stretching every joint fully. Repeat this exercise 5-10 times with each hand.  Finger spread Place your hand flat on a table with your palm facing down. Make sure your wrist stays straight as you do this exercise. Spread your fingers and thumb apart from each other as far as you can until you feel a gentle stretch. Hold this position for 3 seconds. Bring your fingers and thumb tight together again. Hold this position for 3 seconds. Repeat this exercise 5-10 times with each  hand. Making circles Stand or sit with your arm, hand, and all five fingers pointed straight up. Make sure to keep your wrist straight during the exercise. Make a circle by touching the tip of your thumb to the tip of your index finger. Hold for 3 seconds. Then open your hand wide. Repeat this motion with your thumb and each finger on your hand. Repeat this exercise 5-10 times with each hand. Thumb motion Sit with your forearm resting on a table and your wrist straight. Your thumb should be facing up toward the ceiling. Keep your fingers relaxed as you move your thumb. Lift your thumb up as high as you can toward the ceiling. Hold for 3 seconds. Bend your thumb across your palm as far as you can, reaching the tip of your thumb for the small finger (pinkie) side of your palm. Hold for 3 seconds. Repeat this exercise 5-10 times with each hand. Grip strengthening  Hold a stress ball or other soft ball in the middle of your hand. Slowly increase the pressure, squeezing the ball as much as you can without causing pain. Think of bringing the tips of your fingers into the middle of your palm. All of your finger joints should bend when doing this exercise. Hold your squeeze for 3 seconds, then relax. Repeat this exercise 5-10 times with each hand. Contact a health care provider if: Your hand pain or discomfort gets much worse when you do an exercise. Your hand pain or discomfort does not improve within 2 hours after you exercise. If you have any of these problems, stop doing these exercises right away. Do not do them again unless your health care provider says that you can. Get help right away if: You develop sudden, severe hand pain or swelling. If this happens, stop doing these exercises right away. Do not do them again unless your health care provider says that you can. Make sure you discuss any questions you have with your health care provider. Document Revised: 07/14/2018 Document Reviewed:  03/24/2018 Elsevier Patient Education  2020 [employer].

## 2023-06-23 ENCOUNTER — Encounter: Payer: Self-pay | Admitting: Family Medicine

## 2023-07-22 ENCOUNTER — Encounter: Payer: Self-pay | Admitting: Family Medicine

## 2023-07-23 ENCOUNTER — Other Ambulatory Visit: Payer: Self-pay | Admitting: Family Medicine

## 2023-07-26 ENCOUNTER — Other Ambulatory Visit: Payer: Self-pay

## 2023-07-26 MED ORDER — PEN NEEDLES 32G X 4 MM MISC: 1 refills | Status: AC

## 2023-08-22 NOTE — Assessment & Plan Note (Signed)
 Well controlled, no changes to meds. Encouraged heart healthy diet such as the DASH diet and exercise as tolerated.

## 2023-08-22 NOTE — Assessment & Plan Note (Signed)
 Hydrate and stay active, increase fiber intake

## 2023-08-22 NOTE — Assessment & Plan Note (Signed)
 Tolerating statin, encouraged heart healthy diet, avoid trans fats, minimize simple carbs and saturated fats. Increase exercise as tolerated

## 2023-08-22 NOTE — Assessment & Plan Note (Signed)
hgba1c unacceptable, minimize simple carbs. Increase exercise as tolerated. Continue current meds

## 2023-08-22 NOTE — Assessment & Plan Note (Signed)
 Hydrate and monitor

## 2023-08-22 NOTE — Assessment & Plan Note (Signed)
 Encouraged good sleep hygiene such as dark, quiet room. No blue/green glowing lights such as computer screens in bedroom. No alcohol or stimulants in evening. Cut down on caffeine as able. Regular exercise is helpful but not just prior to bed time. Has not responded to melatonin.

## 2023-08-23 ENCOUNTER — Ambulatory Visit: Payer: No Typology Code available for payment source | Admitting: Family Medicine

## 2023-08-23 ENCOUNTER — Encounter: Payer: Self-pay | Admitting: Family Medicine

## 2023-08-23 VITALS — BP 138/76 | HR 67 | Resp 16 | Ht 67.0 in | Wt 206.8 lb

## 2023-08-23 DIAGNOSIS — R252 Cramp and spasm: Secondary | ICD-10-CM

## 2023-08-23 DIAGNOSIS — G4700 Insomnia, unspecified: Secondary | ICD-10-CM | POA: Diagnosis not present

## 2023-08-23 DIAGNOSIS — I10 Essential (primary) hypertension: Secondary | ICD-10-CM | POA: Diagnosis not present

## 2023-08-23 DIAGNOSIS — E103292 Type 1 diabetes mellitus with mild nonproliferative diabetic retinopathy without macular edema, left eye: Secondary | ICD-10-CM

## 2023-08-23 DIAGNOSIS — E782 Mixed hyperlipidemia: Secondary | ICD-10-CM

## 2023-08-23 DIAGNOSIS — Z23 Encounter for immunization: Secondary | ICD-10-CM | POA: Diagnosis not present

## 2023-08-23 DIAGNOSIS — Z1211 Encounter for screening for malignant neoplasm of colon: Secondary | ICD-10-CM

## 2023-08-23 DIAGNOSIS — K5900 Constipation, unspecified: Secondary | ICD-10-CM

## 2023-08-23 NOTE — Patient Instructions (Signed)
 RSV, Respiratory Syncitial Virus Vaccine at pharmacy

## 2023-08-23 NOTE — Progress Notes (Signed)
 Subjective:    Patient ID: Scott Adkins, male    DOB: 05-04-1960, 63 y.o.   MRN: [id number]  Chief Complaint  Patient presents with  . Medical Management of Chronic Issues    Patient presents today for a 6 month follow-up  . Quality Metric Gaps    Colonoscopy, pneumococcal, foot & eye exam    HPI Discussed the use of AI scribe software for clinical note transcription with the patient, who gave verbal consent to proceed.  History of Present Illness Scott Adkins "Scott Adkins" is a 63 year old male who presents for a routine follow-up and screening colonoscopy.  He is due for a screening colonoscopy as it has been over ten years since his last one. He has no bowel issues, including no blood in stools or difficulty with bowel movements, and no family history of colon cancer.  He has type 1 diabetes mellitus with a recent hemoglobin A1c of 7.4%. He manages his diabetes independently without an endocrinologist. He has not had a diabetic eye exam in three years and has not experienced recent vision changes, though he feels it's time to update his glasses prescription.  He experiences sleep disturbances, specifically difficulty staying asleep. He has tried various medications and natural supplements like magnesium and L-tryptophan without success. He is accustomed to these sleep issues and does not wish to pursue further treatment at this time.  He frequently feels overheated and wears light clothing year-round. He has not had any recent illnesses, emergency room visits, or respiratory issues. He works as a Psychologist, occupational and describes his work as going well with no recent significant issues.    Past Medical History:  Diagnosis Date  . Anemia 12/22/2015  . Arthritis   . Chicken pox as a child  . Constipation 12/22/2016  . Diabetes mellitus type I (HCC)    uses insulin  pump// managed by Dr. Bambi Lever Altheimer  . 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 Hyperlipidemia    . Hypertension   . Injury of right rotator cuff 08/16/2012   Has been seen by [facility]  . Left flank pain 09/12/2015  . Mumps as a child  . Pain in joint, lower leg 01/24/2016  . Preventative health care 02/19/2013  . Sun-damaged skin 12/06/2015    Past Surgical History:  Procedure Laterality Date  . insulin  pump     placed and removed  . REFRACTIVE SURGERY Right 2007   [facility]  . ROTATOR CUFF REPAIR  2004   left    Family History  Problem Relation Age of Onset  . Hypertension Mother   . Stroke Father   . Hypertension Father   . Hyperlipidemia Father   . Hypertension Brother   . Hyperlipidemia Brother   . Mental illness Brother        depression  . Heart disease Maternal Grandmother        MI  . Heart disease Maternal Grandfather        MI  . Hyperlipidemia Brother   . Hypertension Brother   . Colon cancer Neg Hx   . Prostate cancer Neg Hx   . Breast cancer Neg Hx   . Rectal cancer Neg Hx   . Diabetes Neg Hx     Social History   Socioeconomic History  . Marital status: Married    Spouse name: Not on file  . Number of children: 3  . Years of education: Not on file  . Highest  education level: 12th grade  Occupational History  . Occupation: welder  Tobacco Use  . Smoking status: Never  . Smokeless tobacco: Never  Substance and Sexual Activity  . Alcohol use: No    Alcohol/week: 1.0 standard drink of alcohol    Types: 1 Shots of liquor per week  . Drug use: No  . Sexual activity: Not on file    Comment: lives with wife and mother in law, no dietary restrictions  Other Topics Concern  . Not on file  Social History Narrative   Married 14 yrs   2 daughters; 1 son   Occupation: Psychologist, occupational      Social Drivers of Corporate investment banker Strain: Medium Risk (06/21/2023)   Overall Financial Resource Strain (CARDIA)   . Difficulty of Paying Living Expenses: Somewhat hard  Food Insecurity: Food Insecurity Present (06/21/2023)   Hunger Vital Sign   .  Worried About Programme researcher, broadcasting/film/video in the Last Year: Sometimes true   . Ran Out of Food in the Last Year: Never true  Transportation Needs: No Transportation Needs (06/21/2023)   PRAPARE - Transportation   . Lack of Transportation (Medical): No   . Lack of Transportation (Non-Medical): No  Physical Activity: Inactive (06/21/2023)   Exercise Vital Sign   . Days of Exercise per Week: 7 days   . Minutes of Exercise per Session: 0 min  Stress: No Stress Concern Present (06/21/2023)   [employer] of Occupational Health - Occupational Stress Questionnaire   . Feeling of Stress : Not at all  Social Connections: Moderately Integrated (06/21/2023)   Social Connection and Isolation Panel [NHANES]   . Frequency of Communication with Friends and Family: Three times a week   . Frequency of Social Gatherings with Friends and Family: Once a week   . Attends Religious Services: 1 to 4 times per year   . Active Member of Clubs or Organizations: No   . Attends Banker Meetings: Not on file   . Marital Status: Married  Catering manager Violence: Not At Risk (11/17/2022)   Received from [facility]   HITS   . Over the last 12 months how often did your partner physically hurt you?: Never   . Over the last 12 months how often did your partner insult you or talk down to you?: Never   . Over the last 12 months how often did your partner threaten you with physical harm?: Never   . Over the last 12 months how often did your partner scream or curse at you?: Never    Outpatient Medications Prior to Visit  Medication Sig Dispense Refill  . aspirin 81 MG tablet Take 81 mg by mouth daily.    . atorvastatin  (LIPITOR) 20 MG tablet TAKE 2 TABLETS (40 MG TOTAL) BY MOUTH DAILY. 180 tablet 1  . atorvastatin  (LIPITOR) 40 MG tablet Take 1 tablet (40 mg total) by mouth daily. 90 tablet 1  . Cholecalciferol (VITAMIN D3) 1000 UNITS CAPS Take 2,000 Units by mouth daily.    . Continuous Glucose Receiver  (DEXCOM G6 RECEIVER) DEVI USE WITH SENSORS TO CHECK BLOOD GLUCOSE. 1 each 0  . Continuous Glucose Sensor (DEXCOM G6 SENSOR) MISC USE TO CHECK BLOOD SUGAR CONTINUOUSLY. CHANGE EVERY 10 DAYS 3 each 3  . Continuous Glucose Transmitter (DEXCOM G6 TRANSMITTER) MISC USE AS DIRECTED 1 each 0  . fluticasone  (FLONASE ) 50 MCG/ACT nasal spray Place 2 sprays into both nostrils daily. 16 g 1  .  hydrochlorothiazide  (HYDRODIURIL ) 25 MG tablet TAKE 1 TABLET (25 MG TOTAL) BY MOUTH DAILY. 90 tablet 1  . Insulin  Glargine (BASAGLAR  KWIKPEN) 100 UNIT/ML Inject 20 Units into the skin daily. 15 mL 5  . Insulin  Pen Needle (PEN NEEDLES) 32G X 4 MM MISC Use as direct once 500 each 1  . NOVOLOG  100 UNIT/ML injection Inject 15 Units into the skin 4 (four) times daily as needed for high blood sugar. 20 mL 5  . sertraline  (ZOLOFT ) 50 MG tablet TAKE 1 TABLET BY MOUTH EVERY DAY 90 tablet 1  . valsartan  (DIOVAN ) 40 MG tablet TAKE 2 TABS BY MOUTH ONCE DAILY 180 tablet 1   No facility-administered medications prior to visit.    Allergies  Allergen Reactions  . Humalog  [Insulin  Lispro] Other (See Comments)    Sugar drops dangerously low   . Lantus  [Insulin  Glargine]     Pt states sugar drops dangerously low  . Lisinopril      Hyperkalemia   . Viagra  [Sildenafil  Citrate] Other (See Comments)    headache    Review of Systems  Constitutional:  Negative for fever and malaise/fatigue.  HENT:  Negative for congestion.   Eyes:  Negative for blurred vision.  Respiratory:  Negative for shortness of breath.   Cardiovascular:  Negative for chest pain, palpitations and leg swelling.  Gastrointestinal:  Negative for abdominal pain, blood in stool and nausea.  Genitourinary:  Negative for dysuria and frequency.  Musculoskeletal:  Negative for falls.  Skin:  Negative for rash.  Neurological:  Negative for dizziness, loss of consciousness and headaches.  Endo/Heme/Allergies:  Negative for environmental allergies.   Psychiatric/Behavioral:  Negative for depression. The patient has insomnia. The patient is not nervous/anxious.   All other systems reviewed and are negative.      Objective:     Physical Exam Vitals reviewed.  Constitutional:      Appearance: Normal appearance. He is not ill-appearing.  HENT:     Head: Normocephalic and atraumatic.     Nose: Nose normal.  Eyes:     Conjunctiva/sclera: Conjunctivae normal.  Cardiovascular:     Rate and Rhythm: Normal rate.     Pulses: Normal pulses.     Heart sounds: Normal heart sounds. No murmur heard. Pulmonary:     Effort: Pulmonary effort is normal.     Breath sounds: Normal breath sounds. No wheezing.  Abdominal:     Palpations: Abdomen is soft. There is no mass.     Tenderness: There is no abdominal tenderness.  Musculoskeletal:     Cervical back: Normal range of motion.     Right lower leg: No edema.     Left lower leg: No edema.  Skin:    General: Skin is warm and dry.  Neurological:     General: No focal deficit present.     Mental Status: He is alert and oriented to person, place, and time.  Psychiatric:        Mood and Affect: Mood normal.    BP 138/76   Pulse 67   Resp 16   Ht 5\' 7"  (1.702 m)   Wt 206 lb 12.8 oz (93.8 kg)   SpO2 96%   BMI 32.39 kg/m  Wt Readings from Last 3 Encounters:  08/23/23 206 lb 12.8 oz (93.8 kg)  06/22/23 209 lb 3.2 oz (94.9 kg)  05/10/23 212 lb (96.2 kg)    Diabetic Foot Exam - Simple   No data filed    Lab Results  Component Value Date   WBC 8.0 05/31/2023   HGB 13.7 05/31/2023   HCT 42.1 05/31/2023   PLT 198.0 05/31/2023   GLUCOSE 176 (H) 05/31/2023   CHOL 134 05/31/2023   TRIG 75.0 05/31/2023   HDL 51.70 05/31/2023   LDLDIRECT 71 09/10/2008   LDLCALC 67 05/31/2023   ALT 25 05/31/2023   AST 24 05/31/2023   NA 141 05/31/2023   K 5.1 05/31/2023   CL 101 05/31/2023   CREATININE 1.05 05/31/2023   BUN 25 (H) 05/31/2023   CO2 32 05/31/2023   TSH 3.52 05/31/2023   PSA  0.40 05/12/2021   HGBA1C 7.4 (H) 05/31/2023   MICROALBUR 1.1 02/23/2023    Lab Results  Component Value Date   TSH 3.52 05/31/2023   Lab Results  Component Value Date   WBC 8.0 05/31/2023   HGB 13.7 05/31/2023   HCT 42.1 05/31/2023   MCV 90.8 05/31/2023   PLT 198.0 05/31/2023   Lab Results  Component Value Date   NA 141 05/31/2023   K 5.1 05/31/2023   CO2 32 05/31/2023   GLUCOSE 176 (H) 05/31/2023   BUN 25 (H) 05/31/2023   CREATININE 1.05 05/31/2023   BILITOT 0.6 05/31/2023   ALKPHOS 82 05/31/2023   AST 24 05/31/2023   ALT 25 05/31/2023   PROT 7.1 05/31/2023   ALBUMIN 4.4 05/31/2023   CALCIUM  9.5 05/31/2023   GFR 75.86 05/31/2023   Lab Results  Component Value Date   CHOL 134 05/31/2023   Lab Results  Component Value Date   HDL 51.70 05/31/2023   Lab Results  Component Value Date   LDLCALC 67 05/31/2023   Lab Results  Component Value Date   TRIG 75.0 05/31/2023   Lab Results  Component Value Date   CHOLHDL 3 05/31/2023   Lab Results  Component Value Date   HGBA1C 7.4 (H) 05/31/2023       Assessment & Plan:  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 Increase exercise as tolerated. Continue current meds   Orders: -     Ambulatory referral to Ophthalmology -     Comprehensive metabolic panel with GFR; Future -     Hemoglobin A1c; Future -     Microalbumin / creatinine urine ratio; Future  Essential hypertension Assessment & Plan: Well controlled, no changes to meds. Encouraged heart healthy diet such as the DASH diet and exercise as tolerated.    Orders: -     CBC with Differential/Platelet; Future -     TSH; Future  Mixed hyperlipidemia Assessment & Plan: Tolerating statin, encouraged heart healthy diet, avoid trans fats, minimize simple carbs and saturated fats. Increase exercise as tolerated  Orders: -      Lipid panel; Future  Insomnia, unspecified type Assessment & Plan: Encouraged good sleep hygiene such as dark, quiet room. No blue/green glowing lights such as computer screens in bedroom. No alcohol or stimulants in evening. Cut down on caffeine as able. Regular exercise is helpful but not just prior to bed time. Has not responded to melatonin.    Muscle cramping Assessment & Plan: Hydrate and monitor    Constipation, unspecified constipation type Assessment & Plan: Hydrate and stay active, increase fiber intake   Colon cancer screening -     Ambulatory referral to Gastroenterology  Need for pneumococcal 20-valent conjugate vaccination -     Pneumococcal conjugate vaccine 20-valent  Assessment and Plan Assessment & Plan Type 1 Diabetes Mellitus Type 1 diabetes with improved A1c from 7.7 to 7.4. Managed in primary care. - Schedule fasting blood work next week for A1c and kidney function. - Referral to ophthalmology for diabetic eye exam. - Adjust follow-up to every four months per insurance coverage for lab work.  Insomnia Chronic insomnia with difficulty staying asleep. Prefers non-medication management due to side effect concerns.  Colonoscopy Screening Due for screening colonoscopy, over ten years since last. No family history of colon cancer. - Referral placed for screening colonoscopy.  Pneumococcal Vaccination Eligible for Prevnar 20 booster. Discussed benefits in preventing pneumococcal infections. - Administer Prevnar 20 vaccine today.  RSV Vaccination Discussed benefits of RSV vaccination for individuals over 50 with diabetes. - Consider RSV vaccination at the pharmacy.      Randie Bustle, MD

## 2023-08-24 ENCOUNTER — Ambulatory Visit: Payer: No Typology Code available for payment source | Admitting: Family Medicine

## 2023-08-31 ENCOUNTER — Other Ambulatory Visit (INDEPENDENT_AMBULATORY_CARE_PROVIDER_SITE_OTHER)

## 2023-08-31 DIAGNOSIS — E103292 Type 1 diabetes mellitus with mild nonproliferative diabetic retinopathy without macular edema, left eye: Secondary | ICD-10-CM

## 2023-08-31 DIAGNOSIS — E782 Mixed hyperlipidemia: Secondary | ICD-10-CM | POA: Diagnosis not present

## 2023-08-31 DIAGNOSIS — I10 Essential (primary) hypertension: Secondary | ICD-10-CM | POA: Diagnosis not present

## 2023-09-01 ENCOUNTER — Ambulatory Visit: Payer: Self-pay | Admitting: Family Medicine

## 2023-09-01 LAB — CBC WITH DIFFERENTIAL/PLATELET
Basophils Absolute: 0.1 10*3/uL (ref 0.0–0.1)
Basophils Relative: 1.2 % (ref 0.0–3.0)
Eosinophils Absolute: 0.2 10*3/uL (ref 0.0–0.7)
Eosinophils Relative: 1.7 % (ref 0.0–5.0)
HCT: 40.1 % (ref 39.0–52.0)
Hemoglobin: 13.1 g/dL (ref 13.0–17.0)
Lymphocytes Relative: 18.1 % (ref 12.0–46.0)
Lymphs Abs: 1.8 10*3/uL (ref 0.7–4.0)
MCHC: 32.6 g/dL (ref 30.0–36.0)
MCV: 88.6 fl (ref 78.0–100.0)
Monocytes Absolute: 0.9 10*3/uL (ref 0.1–1.0)
Monocytes Relative: 8.6 % (ref 3.0–12.0)
Neutro Abs: 7.2 10*3/uL (ref 1.4–7.7)
Neutrophils Relative %: 70.4 % (ref 43.0–77.0)
Platelets: 213 10*3/uL (ref 150.0–400.0)
RBC: 4.53 Mil/uL (ref 4.22–5.81)
RDW: 12.6 % (ref 11.5–15.5)
WBC: 10.2 10*3/uL (ref 4.0–10.5)

## 2023-09-01 LAB — COMPREHENSIVE METABOLIC PANEL WITH GFR
ALT: 23 U/L (ref 0–53)
AST: 21 U/L (ref 0–37)
Albumin: 4 g/dL (ref 3.5–5.2)
Alkaline Phosphatase: 79 U/L (ref 39–117)
BUN: 25 mg/dL — ABNORMAL HIGH (ref 6–23)
CO2: 33 meq/L — ABNORMAL HIGH (ref 19–32)
Calcium: 9.5 mg/dL (ref 8.4–10.5)
Chloride: 103 meq/L (ref 96–112)
Creatinine, Ser: 1.01 mg/dL (ref 0.40–1.50)
GFR: 79.33 mL/min (ref >60.00)
Glucose, Bld: 102 mg/dL — ABNORMAL HIGH (ref 70–99)
Potassium: 4.3 meq/L (ref 3.5–5.1)
Sodium: 141 meq/L (ref 135–145)
Total Bilirubin: 0.3 mg/dL (ref 0.2–1.2)
Total Protein: 6.4 g/dL (ref 6.0–8.3)

## 2023-09-01 LAB — MICROALBUMIN / CREATININE URINE RATIO
Creatinine,U: 187.3 mg/dL
Microalb Creat Ratio: 5.9 mg/g (ref 0.0–30.0)
Microalb, Ur: 1.1 mg/dL (ref 0.0–1.9)

## 2023-09-01 LAB — LIPID PANEL
Cholesterol: 140 mg/dL (ref 0–200)
HDL: 46.9 mg/dL (ref >39.00)
LDL Cholesterol: 72 mg/dL (ref 0–99)
NonHDL: 93.02
Total CHOL/HDL Ratio: 3
Triglycerides: 103 mg/dL (ref 0.0–149.0)
VLDL: 20.6 mg/dL (ref 0.0–40.0)

## 2023-09-01 LAB — HEMOGLOBIN A1C: Hgb A1c MFr Bld: 7.3 % — ABNORMAL HIGH (ref 4.6–6.5)

## 2023-09-01 LAB — TSH: TSH: 3.36 u[IU]/mL (ref 0.35–5.50)

## 2023-09-08 ENCOUNTER — Other Ambulatory Visit: Payer: Self-pay | Admitting: Family Medicine

## 2023-09-08 DIAGNOSIS — E103292 Type 1 diabetes mellitus with mild nonproliferative diabetic retinopathy without macular edema, left eye: Secondary | ICD-10-CM

## 2023-09-14 ENCOUNTER — Ambulatory Visit: Admitting: Family Medicine

## 2023-09-14 ENCOUNTER — Encounter: Payer: Self-pay | Admitting: Family Medicine

## 2023-09-14 VITALS — BP 130/80 | HR 69 | Temp 98.0°F | Resp 16 | Ht 67.0 in | Wt 199.0 lb

## 2023-09-14 DIAGNOSIS — J4521 Mild intermittent asthma with (acute) exacerbation: Secondary | ICD-10-CM

## 2023-09-14 DIAGNOSIS — J069 Acute upper respiratory infection, unspecified: Secondary | ICD-10-CM | POA: Diagnosis not present

## 2023-09-14 LAB — POC COVID19 BINAXNOW: SARS Coronavirus 2 Ag: NEGATIVE

## 2023-09-14 MED ORDER — METHYLPREDNISOLONE ACETATE 80 MG/ML IJ SUSP
80.0000 mg | Freq: Once | INTRAMUSCULAR | Status: AC
  Administered 2023-09-14: 80 mg via INTRAMUSCULAR

## 2023-09-14 NOTE — Patient Instructions (Addendum)
 Continue to push fluids, practice good hand hygiene, and cover your mouth if you cough.  If you start having fevers, shaking or shortness of breath, seek immediate care.  OK to take Tylenol 1000 mg (2 extra strength tabs) or 975 mg (3 regular strength tabs) every 6 hours as needed.  Send me a message Wednesday morning if not significantly improved.   Let us  know if you need anything.

## 2023-09-14 NOTE — Progress Notes (Signed)
 Chief Complaint  Patient presents with   Sinus Problem    Scott Adkins here for URI complaints.  Duration: 1 week Associated symptoms: sinus congestion, sinus pain, rhinorrhea, ear pain, wheezing, and coughing Denies: itchy watery eyes, ear drainage, sore throat, shortness of breath, myalgia, and fevers, N/V/D, loss of taste/smell Treatment to date: SABA which helped Sick contacts: Yes- COVID in wife; he tested neg today  Past Medical History:  Diagnosis Date   Anemia 12/22/2015   Arthritis    Chicken pox as a child   Constipation 12/22/2016   Diabetes mellitus type I (HCC)    uses insulin  pump// managed by Dr. 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   Hyperlipidemia    Hypertension    Injury of right rotator cuff 08/16/2012   Has been seen by [facility]   Left flank pain 09/12/2015   Mild intermittent asthma    Mumps as a child   Pain in joint, lower leg 01/24/2016   Preventative health care 02/19/2013   Sun-damaged skin 12/06/2015    Objective BP 130/80 (BP Location: Left Arm, Patient Position: Sitting)   Pulse 69   Temp 98 F (36.7 C) (Oral)   Resp 16   Ht 5\' 7"  (1.702 m)   Wt 199 lb (90.3 kg)   SpO2 98%   BMI 31.17 kg/m  General: Awake, alert, appears stated age HEENT: AT, [place], ears patent b/l and TM's neg, nares patent w/o discharge, pharynx pink and with green exudate, MMM, no sinus ttp Neck: No masses or asymmetry Heart: RRR Lungs: Transmitted upper airway noises noted, scant wheezing at the bases, decreased air exchange, no accessory muscle use Psych: Age appropriate judgment and insight, normal mood and affect  Mild intermittent asthma with acute exacerbation - Plan: methylPREDNISolone  acetate (DEPO-MEDROL ) injection 80 mg  Viral URI with cough - Plan: POC COVID-19  Exacerbation of chronic issue.  Depo-Medrol  injection today.  Send message if not significantly proved in the next 24 to 48 hours.   COVID testing negative today.  Continue albuterol  as needed.  Continue to push fluids, practice good hand hygiene, cover mouth when coughing. F/u prn. If starting to experience fevers, shaking, or worsening shortness of breath, seek immediate care. Pt voiced understanding and agreement to the plan.  Shellie Dials [place], DO 09/14/23 8:59 AM

## 2023-09-19 ENCOUNTER — Other Ambulatory Visit: Payer: Self-pay | Admitting: Family Medicine

## 2023-09-21 ENCOUNTER — Encounter: Payer: Self-pay | Admitting: Gastroenterology

## 2023-10-09 ENCOUNTER — Other Ambulatory Visit: Payer: Self-pay | Admitting: Family Medicine

## 2023-10-19 ENCOUNTER — Ambulatory Visit (AMBULATORY_SURGERY_CENTER): Admitting: *Deleted

## 2023-10-19 ENCOUNTER — Encounter: Payer: Self-pay | Admitting: Gastroenterology

## 2023-10-19 VITALS — Ht 67.0 in | Wt 197.0 lb

## 2023-10-19 DIAGNOSIS — Z1211 Encounter for screening for malignant neoplasm of colon: Secondary | ICD-10-CM

## 2023-10-19 MED ORDER — NA SULFATE-K SULFATE-MG SULF 17.5-3.13-1.6 GM/177ML PO SOLN: 1.0000 | Freq: Once | ORAL | 0 refills | Status: AC

## 2023-10-19 NOTE — Progress Notes (Signed)
 Pt's name and DOB verified at the beginning of the pre-visit wit 2 identifiers  Pt denies any difficulty with ambulating,sitting, laying down or rolling side to side  Pt has no issues moving head neck or swallowing  No egg or soy allergy known to patient   Hard to wake per pt with anesthesia    Patient denies ever being intubated  No FH of Malignant Hyperthermia  Pt is not on home 02   Pt is not on blood thinners   Pt denies issues with constipation   Pt is not on dialysis  Pt denise any abnormal heart rhythms   Pt denies any upcoming cardiac testing  Patient's chart reviewed by Norleen Schillings CNRA prior to pre-visit and patient appropriate for the [facility].  Pre-visit completed and red dot placed by patient's name on their procedure day (on provider's schedule).    Visit by phone  Pt states weight is 197 lb  IInstructions reviewed. Pt given  both [facility] main # and MD on call # prior to instructions.  Pt states understanding of instructions. Instructed pt to review instructions again prior to procedure and call main # given if has questions.. Pt states they will.   Instructed pt on where to find instructions on My Chart.

## 2023-10-20 ENCOUNTER — Other Ambulatory Visit: Payer: Self-pay | Admitting: Family Medicine

## 2023-11-05 ENCOUNTER — Ambulatory Visit (AMBULATORY_SURGERY_CENTER): Payer: Self-pay | Admitting: Gastroenterology

## 2023-11-05 ENCOUNTER — Encounter: Payer: Self-pay | Admitting: Gastroenterology

## 2023-11-05 VITALS — BP 116/44 | HR 55 | Temp 97.7°F | Resp 10 | Ht 67.0 in | Wt 197.0 lb

## 2023-11-05 DIAGNOSIS — K648 Other hemorrhoids: Secondary | ICD-10-CM

## 2023-11-05 DIAGNOSIS — D120 Benign neoplasm of cecum: Secondary | ICD-10-CM | POA: Diagnosis not present

## 2023-11-05 DIAGNOSIS — Z1211 Encounter for screening for malignant neoplasm of colon: Secondary | ICD-10-CM | POA: Diagnosis present

## 2023-11-05 DIAGNOSIS — K635 Polyp of colon: Secondary | ICD-10-CM | POA: Diagnosis not present

## 2023-11-05 DIAGNOSIS — D123 Benign neoplasm of transverse colon: Secondary | ICD-10-CM

## 2023-11-05 MED ORDER — SODIUM CHLORIDE 0.9 % IV SOLN: 500.0000 mL | Freq: Once | INTRAVENOUS | Status: DC

## 2023-11-05 NOTE — Progress Notes (Signed)
 Called to room to assist during endoscopic procedure.  Patient ID and intended procedure confirmed with present staff. Received instructions for my participation in the procedure from the performing physician.

## 2023-11-05 NOTE — Progress Notes (Signed)
 Pt's states no medical or surgical changes since previsit or office visit.

## 2023-11-05 NOTE — Patient Instructions (Signed)

## 2023-11-05 NOTE — Progress Notes (Signed)
 Report to PACU, RN, vss, BBS= Clear.

## 2023-11-05 NOTE — Progress Notes (Signed)
 [facility] History and Physical   Primary Care Physician:  Scott Harlene LABOR, MD   Reason for Procedure:   Colon cancer screening  Plan:    colonoscopy     HPI: Scott Adkins is a 63 y.o. male  here for colonoscopy screening - last exam 2013 with Scott Adkins.    Patient denies any bowel symptoms at this time. No family history of colon cancer known. Otherwise feels well without any cardiopulmonary symptoms.   I have discussed risks / benefits of anesthesia and endoscopic procedure with Scott Adkins and they wish to proceed with the exams as outlined today.    Past Medical History:  Diagnosis Date   Anemia 12/22/2015   Arthritis    Chicken pox as a child   Constipation 12/22/2016   Diabetes mellitus type I (HCC)    uses insulin  pump// managed by Dr. 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 Authorizing Provider  aspirin 81 MG tablet Take 81 mg by mouth daily.    [provider]  atorvastatin  (LIPITOR) 20 MG tablet TAKE 2 TABLETS (40 MG TOTAL) BY MOUTH DAILY. 07/26/23   Scott Harlene LABOR, MD  Cholecalciferol (VITAMIN D3) 1000 UNITS CAPS Take 2,000 Units by mouth daily.    [provider]  Continuous Glucose Sensor (DEXCOM G7 SENSOR) MISC USE 1 SENSOR TO CHECK BLOOD SUGAR CONTINUOUSLY AND CHANGE EVERY 10 DAYS. 09/08/23    Scott Harlene LABOR, MD  Continuous Glucose Transmitter (DEXCOM G6 TRANSMITTER) MISC USE AS DIRECTED 06/01/23   Scott Harlene LABOR, MD  Cyanocobalamin (B-12) 100 MCG TABS Take by mouth.    [provider]  Ferrous Sulfate Dried (FERROUS SULFATE IRON PO) Take by mouth daily at 12 noon.    [provider]  fluticasone  (FLONASE ) 50 MCG/ACT nasal spray Place 2 sprays into both nostrils daily. Patient taking differently: Place 2 sprays into both nostrils as needed. 01/28/21   Scott Leita Repine, FNP  hydrochlorothiazide  (HYDRODIURIL ) 25 MG tablet TAKE 1 TABLET (25 MG TOTAL) BY MOUTH DAILY. 07/26/23   Scott Harlene LABOR, MD  Insulin  Glargine (BASAGLAR  KWIKPEN) 100 UNIT/ML Inject 20 Units into the skin daily. 10/11/23   Scott Harlene LABOR, MD  Insulin  Pen Needle (PEN NEEDLES) 32G X 4 MM MISC Use as direct once 07/26/23   Scott Harlene LABOR, MD  NOVOLOG  100 UNIT/ML injection Inject 15 Units into the skin 4 (four) times daily as needed for high blood sugar. 04/29/23 11/15/23  Scott Harlene LABOR, MD  sertraline  (ZOLOFT ) 50 MG tablet Take 1 tablet (50 mg total) by mouth daily. 09/20/23   Scott Harlene LABOR, MD  valsartan  (DIOVAN ) 40 MG tablet Take 2 tablets (80 mg total) by mouth daily. 10/20/23  Scott Harlene LABOR, MD    Current Outpatient Medications  Medication Sig Dispense Refill   aspirin 81 MG tablet Take 81 mg by mouth daily.     atorvastatin  (LIPITOR) 20 MG tablet TAKE 2 TABLETS (40 MG TOTAL) BY MOUTH DAILY. 180 tablet 1   Cholecalciferol (VITAMIN D3) 1000 UNITS CAPS Take 2,000 Units by mouth daily.     Continuous Glucose Sensor (DEXCOM G7 SENSOR) MISC USE 1 SENSOR TO CHECK BLOOD SUGAR CONTINUOUSLY AND CHANGE EVERY 10 DAYS. 3 each 3   Continuous Glucose Transmitter (DEXCOM G6 TRANSMITTER) MISC USE AS DIRECTED 1 each 0   Cyanocobalamin (B-12) 100 MCG TABS Take by mouth.     Ferrous Sulfate Dried (FERROUS SULFATE IRON PO) Take by mouth daily at 12 noon.     fluticasone  (FLONASE ) 50 MCG/ACT nasal spray Place 2  sprays into both nostrils daily. (Patient taking differently: Place 2 sprays into both nostrils as needed.) 16 g 1   hydrochlorothiazide  (HYDRODIURIL ) 25 MG tablet TAKE 1 TABLET (25 MG TOTAL) BY MOUTH DAILY. 90 tablet 1   Insulin  Glargine (BASAGLAR  KWIKPEN) 100 UNIT/ML Inject 20 Units into the skin daily. 15 mL 1   Insulin  Pen Needle (PEN NEEDLES) 32G X 4 MM MISC Use as direct once 500 each 1   NOVOLOG  100 UNIT/ML injection Inject 15 Units into the skin 4 (four) times daily as needed for high blood sugar. 20 mL 5   sertraline  (ZOLOFT ) 50 MG tablet Take 1 tablet (50 mg total) by mouth daily. 90 tablet 1   valsartan  (DIOVAN ) 40 MG tablet Take 2 tablets (80 mg total) by mouth daily. 180 tablet 1   Current Facility-Administered Medications  Medication Dose Route Frequency Provider Last Rate Last Admin   0.9 %  sodium chloride  infusion  500 mL Intravenous Once Scott Adkins, Scott SQUIBB, MD        Allergies as of 11/05/2023 - Review Complete 11/05/2023  Allergen Reaction Noted   Humalog  [insulin  lispro] Other (See Comments) 12/26/2018   Lantus  [insulin  glargine] Other (See Comments) 04/29/2023   Lisinopril  Other (See Comments) 05/18/2017   Viagra  [sildenafil  citrate] Other (See Comments) 12/06/2015    Family History  Problem Relation Age of Onset   Hypertension Mother    Stroke Father    Hypertension Father    Hyperlipidemia Father    Hypertension Brother    Hyperlipidemia Brother    Mental illness Brother        depression   Hyperlipidemia Brother    Hypertension Brother    Heart disease Maternal Grandmother        MI   Heart disease Maternal Grandfather        MI   Colon cancer Neg Hx    Prostate cancer Neg Hx    Breast cancer Neg Hx    Rectal cancer Neg Hx    Diabetes Neg Hx    Colon polyps Neg Hx    Esophageal cancer Neg Hx     Social History   Socioeconomic History   Marital status: Married    Spouse name: Not on file   Number of children: 3   Years of education: Not on  file   Highest education level: 12th grade  Occupational History   Occupation: welder  Tobacco Use   Smoking status: Never   Smokeless tobacco: Never  Vaping Use   Vaping status: Never Used  Substance and Sexual Activity   Alcohol use: No    Alcohol/week: 1.0 standard drink of alcohol  Types: 1 Shots of liquor per week   Drug use: No   Sexual activity: Not on file    Comment: lives with wife and mother in law, no dietary restrictions  Other Topics Concern   Not on file  Social History Narrative   Married 14 yrs   2 daughters; 1 son   Occupation: Psychologist, occupational      Social Drivers of Corporate investment banker Strain: Medium Risk (06/21/2023)   Overall Financial Resource Strain (CARDIA)    Difficulty of Paying Living Expenses: Somewhat hard  Food Insecurity: Food Insecurity Present (06/21/2023)   Hunger Vital Sign    Worried About Running Out of Food in the Last Year: Sometimes true    Ran Out of Food in the Last Year: Never true  Transportation Needs: No Transportation Needs (06/21/2023)   PRAPARE - Administrator, Civil Service (Medical): No    Lack of Transportation (Non-Medical): No  Physical Activity: Inactive (06/21/2023)   Exercise Vital Sign    Days of Exercise per Week: 7 days    Minutes of Exercise per Session: 0 min  Stress: No Stress Concern Present (06/21/2023)   [employer] of Occupational Health - Occupational Stress Questionnaire    Feeling of Stress : Not at all  Social Connections: Moderately Integrated (06/21/2023)   Social Connection and Isolation Panel    Frequency of Communication with Friends and Family: Three times a week    Frequency of Social Gatherings with Friends and Family: Once a week    Attends Religious Services: 1 to 4 times per year    Active Member of [employer] or Organizations: No    Attends Engineer, structural: Not on file    Marital Status: Married  Catering manager Violence: Not At Risk (11/17/2022)   Received from  [facility]   HITS    Over the last 12 months how often did your partner physically hurt you?: Never    Over the last 12 months how often did your partner insult you or talk down to you?: Never    Over the last 12 months how often did your partner threaten you with physical harm?: Never    Over the last 12 months how often did your partner scream or curse at you?: Never    Review of Systems: All other review of systems negative except as mentioned in the HPI.  Physical Exam: Vital signs BP (!) 154/92   Pulse 62   Temp 97.7 F (36.5 C) (Temporal)   Ht 5' 7 (1.702 m)   Wt 197 lb (89.4 kg)   SpO2 98%   BMI 30.85 kg/m   General:   Alert,  Well-developed, pleasant and cooperative in NAD Lungs:  Clear throughout to auscultation.   Heart:  Regular rate and rhythm Abdomen:  Soft, nontender and nondistended.   Neuro/Psych:  Alert and cooperative. Normal mood and affect. A and O x 3  Marcey Naval, MD [facility]

## 2023-11-05 NOTE — Op Note (Signed)
  [facility] Patient Name: Scott Adkins Procedure Date: 11/05/2023 1:33 PM MRN: [id number] Endoscopist: Elspeth P. Leigh , MD, [id number] Age: 63 Referring MD:  Date of Birth: 1960-06-25 Gender: Male Account #: [id number] Procedure:                Colonoscopy Indications:              Screening for colorectal malignant neoplasm - last                            exam 2013 - Dr. Obie Medicines:                Monitored Anesthesia Care Procedure:                Pre-Anesthesia Assessment:                           - Prior to the procedure, a History and Physical                            was performed, and patient medications and                            allergies were reviewed. The patient's tolerance of                            previous anesthesia was also reviewed. The risks                            and benefits of the procedure and the sedation                            options and risks were discussed with the patient.                            All questions were answered, and informed consent                            was obtained. Prior Anticoagulants: The patient has                            taken no anticoagulant or antiplatelet agents. ASA                            Grade Assessment: II - A patient with mild systemic                            disease. After reviewing the risks and benefits,                            the patient was deemed in satisfactory condition to                            undergo the procedure.  After obtaining informed consent, the colonoscope                            was passed under direct vision. Throughout the                            procedure, the patient's blood pressure, pulse, and                            oxygen saturations were monitored continuously. The                            Olympus Scope SN: [id number] was introduced through                            the anus and advanced to the the  cecum, identified                            by appendiceal orifice and ileocecal valve. The                            colonoscopy was performed without difficulty. The                            patient tolerated the procedure well. The quality                            of the bowel preparation was good. The ileocecal                            valve, appendiceal orifice, and rectum were                            photographed. Scope In: 1:40:23 PM Scope Out: 2:00:05 PM Scope Withdrawal Time: 0 hours 13 minutes 4 seconds  Total Procedure Duration: 0 hours 19 minutes 42 seconds  Findings:                 The perianal and digital rectal examinations were                            normal.                           A 4 mm polyp was found in the cecum. The polyp was                            flat. The polyp was removed with a cold snare.                            Resection and retrieval were complete.                           A 3 mm polyp was found in the ileocecal valve. The  polyp was sessile. The polyp was removed with a                            cold snare. Resection and retrieval were complete.                           A 12 mm polyp was found in the distal transverse                            colon. The polyp was sessile. The polyp was removed                            with a cold snare. Resection and retrieval were                            complete.                           Internal hemorrhoids were found during                            retroflexion. The hemorrhoids were small.                           The exam was otherwise without abnormality. Complications:            No immediate complications. Estimated blood loss:                            Minimal. Estimated Blood Loss:     Estimated blood loss was minimal. Impression:               - One 4 mm polyp in the cecum, removed with a cold                            snare. Resected and  retrieved.                           - One 3 mm polyp at the ileocecal valve, removed                            with a cold snare. Resected and retrieved.                           - One 12 mm polyp in the distal transverse colon,                            removed with a cold snare. Resected and retrieved.                           - Internal hemorrhoids.                           - The examination was otherwise normal. Recommendation:           -  Patient has a contact number available for                            emergencies. The signs and symptoms of potential                            delayed complications were discussed with the                            patient. Return to normal activities tomorrow.                            Written discharge instructions were provided to the                            patient.                           - Resume previous diet.                           - Continue present medications.                           - Await pathology results. Elspeth P. Elam Ellis, MD 11/05/2023 2:04:12 PM This report has been signed electronically.

## 2023-11-08 ENCOUNTER — Telehealth: Payer: Self-pay | Admitting: Lactation Services

## 2023-11-08 NOTE — Telephone Encounter (Signed)
  Follow up Call-     11/05/2023    1:15 PM  Call back number  Post procedure Call Back phone  # [phone]  Permission to leave phone message Yes     Patient questions:  Do you have a fever, pain , or abdominal swelling? No. Pain Score  0 *  Have you tolerated food without any problems? Yes.    Have you been able to return to your normal activities? Yes.    Do you have any questions about your discharge instructions: Diet   No. Medications  No. Follow up visit  No.  Do you have questions or concerns about your Care? No.  Actions: * If pain score is 4 or above: No action needed, pain <4.

## 2023-11-10 LAB — SURGICAL PATHOLOGY

## 2023-11-11 ENCOUNTER — Ambulatory Visit: Payer: Self-pay | Admitting: Gastroenterology

## 2024-01-16 ENCOUNTER — Other Ambulatory Visit: Payer: Self-pay | Admitting: Family Medicine

## 2024-01-19 ENCOUNTER — Other Ambulatory Visit: Payer: Self-pay | Admitting: Family Medicine

## 2024-01-19 ENCOUNTER — Encounter: Payer: Self-pay | Admitting: Family Medicine

## 2024-01-30 NOTE — Assessment & Plan Note (Signed)
hgba1c unacceptable, minimize simple carbs. Increase exercise as tolerated. Continue current meds

## 2024-01-30 NOTE — Assessment & Plan Note (Signed)
 Well controlled, no changes to meds. Encouraged heart healthy diet such as the DASH diet and exercise as tolerated.

## 2024-01-30 NOTE — Assessment & Plan Note (Signed)
 Encouraged good sleep hygiene such as dark, quiet room. No blue/green glowing lights such as computer screens in bedroom. No alcohol or stimulants in evening. Cut down on caffeine as able. Regular exercise is helpful but not just prior to bed time. CBTi APP from [place]

## 2024-01-30 NOTE — Assessment & Plan Note (Signed)
 Hydrate and monitor

## 2024-01-31 ENCOUNTER — Ambulatory Visit (INDEPENDENT_AMBULATORY_CARE_PROVIDER_SITE_OTHER): Admitting: Family Medicine

## 2024-01-31 VITALS — BP 139/59 | HR 60 | Temp 98.0°F | Resp 16 | Ht 67.0 in | Wt 200.0 lb

## 2024-01-31 DIAGNOSIS — I10 Essential (primary) hypertension: Secondary | ICD-10-CM

## 2024-01-31 DIAGNOSIS — R351 Nocturia: Secondary | ICD-10-CM | POA: Diagnosis not present

## 2024-01-31 DIAGNOSIS — G4700 Insomnia, unspecified: Secondary | ICD-10-CM | POA: Diagnosis not present

## 2024-01-31 DIAGNOSIS — Z23 Encounter for immunization: Secondary | ICD-10-CM | POA: Diagnosis not present

## 2024-01-31 DIAGNOSIS — E782 Mixed hyperlipidemia: Secondary | ICD-10-CM | POA: Diagnosis not present

## 2024-01-31 DIAGNOSIS — R252 Cramp and spasm: Secondary | ICD-10-CM | POA: Diagnosis not present

## 2024-01-31 DIAGNOSIS — E103292 Type 1 diabetes mellitus with mild nonproliferative diabetic retinopathy without macular edema, left eye: Secondary | ICD-10-CM

## 2024-01-31 MED ORDER — TIZANIDINE HCL 2 MG PO TABS: 1.0000 mg | ORAL_TABLET | Freq: Four times a day (QID) | ORAL | 1 refills | Status: DC | PRN

## 2024-01-31 MED ORDER — METHYLPREDNISOLONE 4 MG PO TABS: ORAL_TABLET | ORAL | 0 refills | Status: AC

## 2024-01-31 MED ORDER — BASAGLAR KWIKPEN 100 UNIT/ML ~~LOC~~ SOPN: 20.0000 [IU] | PEN_INJECTOR | Freq: Every day | SUBCUTANEOUS | 5 refills | Status: AC

## 2024-01-31 NOTE — Patient Instructions (Signed)
 Annual Covid RSV, Respiratory Syncitial Virus Vaccine. Arexvy once  All [facility] are walk in vaccine clinics M-F 9-4

## 2024-02-01 ENCOUNTER — Encounter: Payer: Self-pay | Admitting: Family Medicine

## 2024-02-01 ENCOUNTER — Ambulatory Visit: Payer: Self-pay | Admitting: Family Medicine

## 2024-02-01 LAB — CBC WITH DIFFERENTIAL/PLATELET
Basophils Absolute: 0.1 K/uL (ref 0.0–0.1)
Basophils Relative: 0.6 % (ref 0.0–3.0)
Eosinophils Absolute: 0.1 K/uL (ref 0.0–0.7)
Eosinophils Relative: 1.1 % (ref 0.0–5.0)
HCT: 42 % (ref 39.0–52.0)
Hemoglobin: 13.9 g/dL (ref 13.0–17.0)
Lymphocytes Relative: 16.2 % (ref 12.0–46.0)
Lymphs Abs: 1.5 K/uL (ref 0.7–4.0)
MCHC: 33.1 g/dL (ref 30.0–36.0)
MCV: 89.7 fl (ref 78.0–100.0)
Monocytes Absolute: 0.7 K/uL (ref 0.1–1.0)
Monocytes Relative: 7.4 % (ref 3.0–12.0)
Neutro Abs: 6.8 K/uL (ref 1.4–7.7)
Neutrophils Relative %: 74.7 % (ref 43.0–77.0)
Platelets: 202 K/uL (ref 150.0–400.0)
RBC: 4.68 Mil/uL (ref 4.22–5.81)
RDW: 12.6 % (ref 11.5–15.5)
WBC: 9.1 K/uL (ref 4.0–10.5)

## 2024-02-01 LAB — PSA: PSA: 0.4 ng/mL (ref 0.10–4.00)

## 2024-02-01 LAB — COMPREHENSIVE METABOLIC PANEL WITH GFR
ALT: 24 U/L (ref 0–53)
AST: 20 U/L (ref 0–37)
Albumin: 4.6 g/dL (ref 3.5–5.2)
Alkaline Phosphatase: 83 U/L (ref 39–117)
BUN: 26 mg/dL — ABNORMAL HIGH (ref 6–23)
CO2: 33 meq/L — ABNORMAL HIGH (ref 19–32)
Calcium: 9.9 mg/dL (ref 8.4–10.5)
Chloride: 100 meq/L (ref 96–112)
Creatinine, Ser: 1.08 mg/dL (ref 0.40–1.50)
GFR: 72.99 mL/min (ref >60.00)
Glucose, Bld: 166 mg/dL — ABNORMAL HIGH (ref 70–99)
Potassium: 4 meq/L (ref 3.5–5.1)
Sodium: 141 meq/L (ref 135–145)
Total Bilirubin: 0.4 mg/dL (ref 0.2–1.2)
Total Protein: 7.3 g/dL (ref 6.0–8.3)

## 2024-02-01 LAB — HEMOGLOBIN A1C: Hgb A1c MFr Bld: 7.8 % — ABNORMAL HIGH (ref 4.6–6.5)

## 2024-02-01 LAB — MAGNESIUM: Magnesium: 2 mg/dL (ref 1.5–2.5)

## 2024-02-01 LAB — TSH: TSH: 2.85 u[IU]/mL (ref 0.35–5.50)

## 2024-02-01 LAB — LIPID PANEL
Cholesterol: 155 mg/dL (ref 0–200)
HDL: 49.3 mg/dL (ref >39.00)
LDL Cholesterol: 85 mg/dL (ref 0–99)
NonHDL: 106.02
Total CHOL/HDL Ratio: 3
Triglycerides: 104 mg/dL (ref 0.0–149.0)
VLDL: 20.8 mg/dL (ref 0.0–40.0)

## 2024-02-01 NOTE — Progress Notes (Signed)
 Subjective:    Patient ID: Scott Adkins, male    DOB: 03/28/1961, 63 y.o.   MRN: [id number]  Chief Complaint  Patient presents with   Diabetes    Here for follow up   Hypertension    Here for follow up   Leg Pain    Patient complains of left leg pain    HPI Discussed the use of AI scribe software for clinical note transcription with the patient, who gave verbal consent to proceed.  History of Present Illness Scott Adkins is a 63 year old male with diabetes who presents with left leg pain.  He has been experiencing constant pain in his left leg for the past two weeks, extending from the back of his hip down to his ankle, primarily affecting the posterior aspect of the leg. The pain is exacerbated by walking and does not improve with any position or activity. He denies any recent injury or fall that could have caused the pain and has not yet tried any medications or treatments for it.  He mentions a bug bite on his backside but does not believe it is related to his current symptoms. No incontinence, new bladder or bowel issues, or pain in the middle of his back. His foot is unaffected by the pain.  He has a history of diabetes. He has previously taken steroids without significant impact on his blood sugar. His current medications include Basaglar  (insulin ), vitamin D , aspirin, sertraline , atorvastatin , vitamin B12, and a multivitamin with minerals. He has not taken iron for the past three days due to running out but plans to resume it soon.    Past Medical History:  Diagnosis Date   Anemia 12/22/2015   Arthritis    Chicken pox as a child   Constipation 12/22/2016   Diabetes mellitus type I (HCC)    uses insulin  pump// managed by Dr. Ozell Altheimer   DM (diabetes mellitus), type 1 with ophthalmic complications (HCC) 09/05/2014   Hyperlipidemia    Hypertension    Injury of right rotator cuff 08/16/2012   Has been seen by [facility]   Left flank pain 09/12/2015   Mild  intermittent asthma    Mumps as a child   Pain in joint, lower leg 01/24/2016   Preventative health care 02/19/2013   Sun-damaged skin 12/06/2015    Past Surgical History:  Procedure Laterality Date   APPENDECTOMY     COLONOSCOPY     insulin  pump     placed and removed   REFRACTIVE SURGERY Right 2007   [facility]   ROTATOR CUFF REPAIR  2004   left    Family History  Problem Relation Age of Onset   Hypertension Mother    Stroke Father    Hypertension Father    Hyperlipidemia Father    Hypertension Brother    Hyperlipidemia Brother    Mental illness Brother        depression   Hyperlipidemia Brother    Hypertension Brother    Heart disease Maternal Grandmother        MI   Heart disease Maternal Grandfather        MI   Colon cancer Neg Hx    Prostate cancer Neg Hx    Breast cancer Neg Hx    Rectal cancer Neg Hx    Diabetes Neg Hx    Colon polyps Neg Hx    Esophageal cancer Neg Hx     Social History   Socioeconomic  History   Marital status: Married    Spouse name: Not on file   Number of children: 3   Years of education: Not on file   Highest education level: 12th grade  Occupational History   Occupation: welder  Tobacco Use   Smoking status: Never   Smokeless tobacco: Never  Vaping Use   Vaping status: Never Used  Substance and Sexual Activity   Alcohol use: No    Alcohol/week: 1.0 standard drink of alcohol    Types: 1 Shots of liquor per week   Drug use: No   Sexual activity: Yes    Comment: lives with wife and mother in law, no dietary restrictions  Other Topics Concern   Not on file  Social History Narrative   Married 14 yrs   2 daughters; 1 son   Occupation: psychologist, occupational      Social Drivers of Corporate Investment Banker Strain: Medium Risk (01/30/2024)   Overall Financial Resource Strain (CARDIA)    Difficulty of Paying Living Expenses: Somewhat hard  Food Insecurity: Food Insecurity Present (01/30/2024)   Hunger Vital Sign    Worried  About Running Out of Food in the Last Year: Sometimes true    Ran Out of Food in the Last Year: Never true  Transportation Needs: No Transportation Needs (01/30/2024)   PRAPARE - Administrator, Civil Service (Medical): No    Lack of Transportation (Non-Medical): No  Physical Activity: Sufficiently Active (01/30/2024)   Exercise Vital Sign    Days of Exercise per Week: 7 days    Minutes of Exercise per Session: 30 min  Stress: Stress Concern Present (01/30/2024)   [employer] of Occupational Health - Occupational Stress Questionnaire    Feeling of Stress: Very much  Social Connections: Moderately Integrated (01/30/2024)   Social Connection and Isolation Panel    Frequency of Communication with Friends and Family: More than three times a week    Frequency of Social Gatherings with Friends and Family: Once a week    Attends Religious Services: 1 to 4 times per year    Active Member of [employer] or Organizations: No    Attends Engineer, Structural: Not on file    Marital Status: Married  Catering Manager Violence: Not At Risk (11/17/2022)   Received from [facility]   HITS    Over the last 12 months how often did your partner physically hurt you?: Never    Over the last 12 months how often did your partner insult you or talk down to you?: Never    Over the last 12 months how often did your partner threaten you with physical harm?: Never    Over the last 12 months how often did your partner scream or curse at you?: Never    Outpatient Medications Prior to Visit  Medication Sig Dispense Refill   aspirin 81 MG tablet Take 81 mg by mouth daily.     atorvastatin  (LIPITOR) 20 MG tablet TAKE 2 TABLETS BY MOUTH EVERY DAY 180 tablet 1   Cholecalciferol (VITAMIN D3) 1000 UNITS CAPS Take 2,000 Units by mouth daily.     Continuous Glucose Sensor (DEXCOM G7 SENSOR) MISC USE 1 SENSOR TO CHECK BLOOD SUGAR CONTINUOUSLY AND CHANGE EVERY 10 DAYS. 3 each 3   Cyanocobalamin (B-12)  100 MCG TABS Take by mouth.     fluticasone  (FLONASE ) 50 MCG/ACT nasal spray Place 2 sprays into both nostrils daily. (Patient taking differently: Place 2 sprays into both  nostrils as needed.) 16 g 1   hydrochlorothiazide  (HYDRODIURIL ) 25 MG tablet TAKE 1 TABLET (25 MG TOTAL) BY MOUTH DAILY. 90 tablet 1   Insulin  Pen Needle (PEN NEEDLES) 32G X 4 MM MISC Use as direct once 500 each 1   NOVOLOG  100 UNIT/ML injection Inject 15 Units into the skin 4 (four) times daily as needed for high blood sugar. 20 mL 5   sertraline  (ZOLOFT ) 50 MG tablet Take 1 tablet (50 mg total) by mouth daily. 90 tablet 1   valsartan  (DIOVAN ) 40 MG tablet Take 2 tablets (80 mg total) by mouth daily. 180 tablet 1   Ferrous Sulfate Dried (FERROUS SULFATE IRON PO) Take by mouth daily at 12 noon.     Insulin  Glargine (BASAGLAR  KWIKPEN) 100 UNIT/ML Inject 20 Units into the skin daily. 15 mL 1   Continuous Glucose Transmitter (DEXCOM G6 TRANSMITTER) MISC USE AS DIRECTED 1 each 0   Facility-Administered Medications Prior to Visit  Medication Dose Route Frequency Provider Last Rate Last Admin   0.9 %  sodium chloride  infusion  500 mL Intravenous Once Armbruster, Elspeth SQUIBB, MD        Allergies  Allergen Reactions   Humalog  [Insulin  Lispro] Other (See Comments)    Sugar drops dangerously low    Lantus  [Insulin  Glargine] Other (See Comments)    Pt states sugar drops dangerously low   Lisinopril  Other (See Comments)    Hyperkalemia    Viagra  [Sildenafil  Citrate] Other (See Comments)    headache    Review of Systems  Constitutional:  Negative for fever and malaise/fatigue.  HENT:  Negative for congestion.   Eyes:  Negative for blurred vision.  Respiratory:  Negative for shortness of breath.   Cardiovascular:  Negative for chest pain, palpitations and leg swelling.  Gastrointestinal:  Negative for abdominal pain, blood in stool and nausea.  Genitourinary:  Negative for dysuria and frequency.  Musculoskeletal:  Positive for  joint pain and myalgias. Negative for falls.  Skin:  Negative for rash.  Neurological:  Negative for dizziness, loss of consciousness and headaches.  Endo/Heme/Allergies:  Negative for environmental allergies.  Psychiatric/Behavioral:  Negative for depression. The patient has insomnia. The patient is not nervous/anxious.        Objective:    Physical Exam Vitals reviewed.  Constitutional:      Appearance: Normal appearance. He is not ill-appearing.  HENT:     Head: Normocephalic and atraumatic.     Nose: Nose normal.  Eyes:     Conjunctiva/sclera: Conjunctivae normal.  Cardiovascular:     Rate and Rhythm: Normal rate.     Pulses: Normal pulses.     Heart sounds: Normal heart sounds. No murmur heard. Pulmonary:     Effort: Pulmonary effort is normal.     Breath sounds: Normal breath sounds. No wheezing.  Abdominal:     Palpations: Abdomen is soft. There is no mass.     Tenderness: There is no abdominal tenderness.  Musculoskeletal:     Cervical back: Normal range of motion.     Right lower leg: No edema.     Left lower leg: No edema.  Skin:    General: Skin is warm and dry.  Neurological:     General: No focal deficit present.     Mental Status: He is alert and oriented to person, place, and time.  Psychiatric:        Mood and Affect: Mood normal.     BP (!) 139/59 (BP Location:  Right Arm, Patient Position: Sitting, Cuff Size: Large)   Pulse 60   Temp 98 F (36.7 C) (Oral)   Resp 16   Ht 5' 7 (1.702 m)   Wt 200 lb (90.7 kg)   SpO2 97%   BMI 31.32 kg/m  Wt Readings from Last 3 Encounters:  01/31/24 200 lb (90.7 kg)  11/05/23 197 lb (89.4 kg)  10/19/23 197 lb (89.4 kg)    Diabetic Foot Exam - Simple   No data filed    Lab Results  Component Value Date   WBC 9.1 01/31/2024   HGB 13.9 01/31/2024   HCT 42.0 01/31/2024   PLT 202.0 01/31/2024   GLUCOSE 166 (H) 01/31/2024   CHOL 155 01/31/2024   TRIG 104.0 01/31/2024   HDL 49.30 01/31/2024   LDLDIRECT  71 09/10/2008   LDLCALC 85 01/31/2024   ALT 24 01/31/2024   AST 20 01/31/2024   NA 141 01/31/2024   K 4.0 01/31/2024   CL 100 01/31/2024   CREATININE 1.08 01/31/2024   BUN 26 (H) 01/31/2024   CO2 33 (H) 01/31/2024   TSH 2.85 01/31/2024   PSA 0.40 01/31/2024   HGBA1C 7.8 (H) 01/31/2024   MICROALBUR 1.1 08/31/2023    Lab Results  Component Value Date   TSH 2.85 01/31/2024   Lab Results  Component Value Date   WBC 9.1 01/31/2024   HGB 13.9 01/31/2024   HCT 42.0 01/31/2024   MCV 89.7 01/31/2024   PLT 202.0 01/31/2024   Lab Results  Component Value Date   NA 141 01/31/2024   K 4.0 01/31/2024   CO2 33 (H) 01/31/2024   GLUCOSE 166 (H) 01/31/2024   BUN 26 (H) 01/31/2024   CREATININE 1.08 01/31/2024   BILITOT 0.4 01/31/2024   ALKPHOS 83 01/31/2024   AST 20 01/31/2024   ALT 24 01/31/2024   PROT 7.3 01/31/2024   ALBUMIN 4.6 01/31/2024   CALCIUM  9.9 01/31/2024   GFR 72.99 01/31/2024   Lab Results  Component Value Date   CHOL 155 01/31/2024   Lab Results  Component Value Date   HDL 49.30 01/31/2024   Lab Results  Component Value Date   LDLCALC 85 01/31/2024   Lab Results  Component Value Date   TRIG 104.0 01/31/2024   Lab Results  Component Value Date   CHOLHDL 3 01/31/2024   Lab Results  Component Value Date   HGBA1C 7.8 (H) 01/31/2024       Assessment & Plan:  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 Increase exercise as tolerated. Continue current meds   Orders: -     Hemoglobin A1c  Essential hypertension Assessment & Plan: Well controlled, no changes to meds. Encouraged heart healthy diet such as the DASH diet and exercise as tolerated.    Orders: -     Comprehensive metabolic panel with GFR -     CBC with Differential/Platelet -     TSH  Muscle cramping Assessment & Plan: Hydrate and monitor   Orders: -      Magnesium  Insomnia, unspecified type Assessment & Plan: Encouraged good sleep hygiene such as dark, quiet room. No blue/green glowing lights such as computer screens in bedroom. No alcohol or stimulants in evening. Cut down on caffeine as able. Regular exercise is helpful but not just prior to bed time. CBTi APP from [place]   Needs flu shot -  Flu vaccine trivalent PF, 6mos and older(Flulaval,Afluria,Fluarix,Fluzone)  Nocturia -     PSA  Mixed hyperlipidemia -     Lipid panel  Other orders -     methylPREDNISolone ; 6 tabs po x 1 day then 5 tabs po x 1 day then 4 tabs po x 1 day then 3 tabs po x 1 day then 2 tabs po x 1 day then 1 tab po x 1 day and stop  Dispense: 21 tablet; Refill: 0 -     tiZANidine HCl; Take 0.5-2 tablets (1-4 mg total) by mouth every 6 (six) hours as needed for muscle spasms.  Dispense: 60 tablet; Refill: 1 -     Basaglar  KwikPen; Inject 20 Units into the skin daily.  Dispense: 15 mL; Refill: 5    Assessment and Plan Assessment & Plan Sacroiliitis, left side Pain radiating from the back of the hip to the ankle, exacerbated by walking, present for two weeks. No recent injury or fall. No incontinence or weakness. Differential includes sciatica, but symptoms are consistent with sacroiliitis. Examination reveals left hip forward tilt, suggesting sacroiliac joint involvement. Discussed potential for sacroiliitis to be transient or recurrent, and the possibility of needing further interventions if symptoms persist. - Prescribed Medrol  dose pack (steroids) with tapering dose. - Prescribed tizanidine 2 mg tablets, with dosing flexibility up to 4 mg at bedtime. - Advised minimizing carbohydrate intake during steroid course to manage blood sugar levels. - Instructed to monitor for worsening symptoms such as weakness or incontinence. - Will consider referral to sports medicine or physical therapy if symptoms persist. - Recommended gentle stretching exercises for pelvis and  hips once symptoms improve.  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 Previous steroid use did not significantly elevate blood sugars. - Advised minimizing carbohydrate intake during steroid course. - Will adjust insulin  regimen if necessary based on blood sugar monitoring.  Essential hypertension Hypertension management ongoing with current medication regimen.  Mixed hyperlipidemia Hyperlipidemia management ongoing with current medication regimen.  Nocturia Occasional nocturia, common in individuals over 50. PSA levels have been stable in the past. - Ordered PSA test to monitor prostate health.  Muscle cramping Muscle cramping.  General Health Maintenance Discussed vaccinations including RSV and COVID. RSV vaccine reduces hospitalization risk by 84-85% in individuals over 60, especially diabetics. COVID vaccine recommended due to ongoing risk of severe illness. - Consider RSV vaccine Vesta) and annual COVID vaccine. - Visit pharmacy for vaccinations if desired.  Recording duration: 22 minutes     Harlene Horton, MD

## 2024-02-11 ENCOUNTER — Ambulatory Visit: Payer: Self-pay

## 2024-02-11 NOTE — Telephone Encounter (Signed)
 FYI Only or Action Required?: FYI only for provider: appointment scheduled on 11.11.25.  Patient was last seen in primary care on 01/31/2024 by Domenica Harlene LABOR, MD.  Called Nurse Triage reporting Hip Pain.  Symptoms began several weeks ago.  Interventions attempted: Prescription medications: steroid and muscle relaxer.  Symptoms are: gradually worsening.  Triage Disposition: See PCP When Office is Open (Within 3 Days)  Patient/caregiver understands and will follow disposition?: Yes   Copied from CRM #[id number]. Topic: Clinical - Red Word Triage >> Feb 11, 2024 12:12 PM Thersia BROCKS wrote: [facility] that prompted transfer to Nurse Triage: Patient called in stated he was seen by his provider a couple days ago, and was prescribed something for his pain, but he would like to be seen agin to be prescribed something else because it isnt working, stated he is so much man he can barely walk at work Reason for Disposition  [1] MODERATE pain (e.g., interferes with normal activities, limping) AND [2] present > 3 days  Answer Assessment - Initial Assessment Questions Pt states that the medications are not helping at all, in fact the pain is getting worse. He states he can't stand and walk on it. Started to get worse yesterday. Pt states he is on his feet all day long at work. He states he can't just stop and sit for an hour at time. Pt states he has not tried tylenol or ibuprofen for the pain. RN did advise that along with other care instructions.     1. LOCATION and RADIATION: Where is the pain located? Does the pain spread (shoot) anywhere else?     Left hip all the way down to left ankle 2. QUALITY: What does the pain feel like?  (e.g., sharp, dull, aching, burning)     Sharp shooting 3. SEVERITY: How bad is the pain? What does it keep you from doing?   (Scale 1-10; or mild, moderate, severe)     5 bc he is sitting down but if he stands up its a 10-15 4. ONSET: When did the pain start?  Does it come and go, or is it there all the time?     Pt was seen for this but states pain is getting worse. Started getting worse yesterday 5. WORK OR EXERCISE: Has there been any recent work or exercise that involved this part of the body?      Does lots of walking for work 6. CAUSE: What do you think is causing the hip pain?      unknown 7. AGGRAVATING FACTORS: What makes the hip pain worse? (e.g., walking, climbing stairs, running)     Standing  8. OTHER SYMPTOMS: Do you have any other symptoms? (e.g., back pain, pain shooting down leg,  fever, rash)     No  Protocols used: Hip Pain-A-AH

## 2024-02-11 NOTE — Telephone Encounter (Signed)
 Appt scheduled

## 2024-02-15 ENCOUNTER — Ambulatory Visit: Payer: Self-pay | Admitting: Medical

## 2024-02-15 ENCOUNTER — Ambulatory Visit (HOSPITAL_BASED_OUTPATIENT_CLINIC_OR_DEPARTMENT_OTHER)
Admission: RE | Admit: 2024-02-15 | Discharge: 2024-02-15 | Disposition: A | Discharge status: Home / Self Care | Source: Ambulatory Visit | Attending: Medical | Admitting: Medical

## 2024-02-15 ENCOUNTER — Encounter: Payer: Self-pay | Admitting: Medical

## 2024-02-15 ENCOUNTER — Ambulatory Visit: Admitting: Medical

## 2024-02-15 VITALS — BP 147/79 | HR 64 | Temp 98.0°F | Resp 14 | Ht 67.0 in | Wt 200.8 lb

## 2024-02-15 DIAGNOSIS — M25552 Pain in left hip: Secondary | ICD-10-CM | POA: Insufficient documentation

## 2024-02-15 MED ORDER — KETOROLAC TROMETHAMINE 30 MG/ML IJ SOLN
30.0000 mg | Freq: Once | INTRAMUSCULAR | Status: AC
  Administered 2024-02-15: 30 mg via INTRAMUSCULAR

## 2024-02-15 NOTE — Progress Notes (Signed)
   Subjective:    Patient ID: Scott Adkins, male    DOB: 29-Oct-1960, 63 y.o.   MRN: [id number]  HPI Scott Adkins is a 63 year old male who presents with left hip pain.  He has been experiencing left hip pain for approximately two weeks, which he attributes to stepping into a hole while walking his dog. The pain began the day after the incident and is described as a 5 to 6 out of 10 in intensity, worsening with walking. The pain is present even at rest and does not radiate to the groin or back.  He has not experienced relief from previously prescribed medications. He was taking Zanaflex,  for muscle spasms, but it did not help as his hip was not spasming, just hurting. He also completed a Medrol  dose pack without improvement in symptoms. He has not tried over-the-counter medications like Tylenol.  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 His work shift is from 5 AM to 2:30 PM, and he travels 15 minutes to work. He occasionally climbs ladders and operates heavy machinery. Despite the pain, he plans to return to work after the appointment.  His past medical history includes hypertension, for which he takes Valsartan  40 mg, two tablets daily, usually at night. He has no history of stomach ulcers. His kidney function is noted with a GFR of 72 and creatinine of 1.08. No groin pain, no back pain.    Review of Systems See hpi    Objective:   Physical Exam  General- No acute distress. Pleasant patient. Neck- Full range of motion, no jvd Lungs- Clear, even and unlabored. Heart- regular rate and rhythm. Neurologic- CNII- XII grossly intact.  Left hip- no pain on palpation but pain moderate to severe on range of motion.  Limps when walks.      Assessment & Plan:   Patient Instructions  Pain in left hip Left hip pain likely from stepping in a hole, unresponsive to Zanaflex and  Medrol . Pain 5-6/10, worsens with walking, persists at rest. Pain localized to hip. No prior imaging. - Ordered x-ray of left hip for fast read. - Administered Toradol  30 mg injection for pain management. - Advised to monitor blood pressure at home and report via MyChart by Thursday afternoon or Friday morning. - Will consider prescribing tramadol  post-x-ray review, with instructions to take at work and avoid operating heavy machinery or climbing ladders. -potential advise otc nsaid pending sport med appt - Continue follow-up with sports medicine on Thursday.   Shawana Knoch, PA-C

## 2024-02-15 NOTE — Patient Instructions (Signed)
 Pain in left hip Left hip pain likely from stepping in a hole, unresponsive to Zanaflex and Medrol . Pain 5-6/10, worsens with walking, persists at rest. Pain localized to hip. No prior imaging. - Ordered x-ray of left hip for fast read. - Administered Toradol  30 mg injection for pain management. - Advised to monitor blood pressure at home and report via MyChart by Thursday afternoon or Friday morning. - Will consider prescribing tramadol  post-x-ray review, with instructions to take at work and avoid operating heavy machinery or climbing ladders. -potential advise otc nsaid pending sport med appt - Continue follow-up with sports medicine on Thursday.

## 2024-02-15 NOTE — Addendum Note (Signed)
 Addended by: DORLENE CHIQUITA RAMAN on: 02/15/2024 12:23 PM   Modules accepted: Orders

## 2024-02-16 NOTE — Progress Notes (Signed)
 I, Leotis Batter, CMA acting as a scribe for Artist Lloyd, MD.  Scott Adkins is a 63 y.o. male who presents to [facility] at [facility] today for L hip pain ongoing for about 2 wks. He thinks the pain started after stepping in a hole while walking his dog. Pt locates pain to posterior hip, radiating into the gluteal region and down the leg past the knee.Sx worse with sitting. Denies night disturbance. Ambulating with a limp. Denies n/t/w in the L LE. Got about 2 days of relief with Ketorolac  injection. Has also tried heat.  Additionally he had a trial of Medrol  Dosepak which did not help much.  It did cause hyperglycemia.  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   Radiating pain: L LE LE numbness/tingling: denies LE weakness: denies Aggravates: sitting Treatments tried: Zanaflex, prednisone , Ketorolac  IM injection,   Dx imaging: 02/15/24 L hip XR  11/11/21 L-spine XR  Pertinent review of systems: No fevers or chills  Relevant historical information: Diabetes   Exam:  Ht 5' 7 (1.702 m)   BMI 31.45 kg/m  General: Well Developed, well nourished, and in no acute distress.   MSK: L-spine: Normal-appearing Nontender palpation spinal midline. Decreased lumbar motion. Lower extremity strength is intact. Reflexes are intact. Positive left-sided slump test.   Lab and Radiology Results No results found for this or any previous visit (from the past 72 hours). DG Hip Unilat W OR W/O Pelvis 2-3 Views Left Result Date: 02/15/2024 CLINICAL DATA:  Left hip pain for 2 weeks.  No known injury. EXAM: DG HIP (WITH OR WITHOUT PELVIS) 2-3V LEFT COMPARISON:  Pelvic CT 12/26/2009. FINDINGS: The mineralization and alignment are normal. There is no evidence of acute fracture or dislocation. No evidence of femoral head osteonecrosis. There are mild degenerative changes at both hips. Probable electronic device  projecting over the right iliac crest. 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 No acute osseous findings. Electronically Signed   By: Elsie Perone M.D.   On: 02/15/2024 13:14  I, Artist Lloyd, personally (independently) visualized and performed the interpretation of the images attached in this note.  X-ray images lumbar spine obtained today personally and independently interpreted. DDD L5-S1.  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  No acute fractures are visible. Await formal radiology review    Assessment and Plan: 63 y.o. male with left low back pain radiating down the left leg concerning for left L5 lumbar radiculopathy.  Unfortunately he is failing typical early conservative management of steroids.  Plan for physical therapy and gabapentin.  If worsening we will move directly to MRI.  Discussed precautions.  Otherwise refer to PT and check back in 6 weeks.   PDMP not reviewed this encounter. Orders Placed This Encounter  Procedures   DG Lumbar Spine 2-3 Views    Standing Status:   Future    Number of Occurrences:   1    Expiration Date:   03/18/2024    Reason for Exam (SYMPTOM  OR DIAGNOSIS REQUIRED):   low back / hip pain    Preferred imaging location?:   [facility]   Ambulatory referral to Physical Therapy    Referral Priority:   Routine    Referral Type:   Physical Medicine    Referral Reason:   Specialty Services Required    Requested Specialty:   Physical Therapy    Number of Visits Requested:   1  Meds ordered this encounter  Medications   gabapentin (NEURONTIN) 100 MG capsule    Sig: Take 1-3 capsules (100-300 mg total) by mouth 3 (three) times daily as needed.    Dispense:  90 capsule    Refill:  2     Discussed warning signs or symptoms. Please see discharge instructions. Patient expresses understanding.   The above documentation has been reviewed and is accurate and complete Artist Lloyd, M.D.

## 2024-02-17 ENCOUNTER — Ambulatory Visit

## 2024-02-17 ENCOUNTER — Encounter: Payer: Self-pay | Admitting: Family Medicine

## 2024-02-17 ENCOUNTER — Ambulatory Visit (INDEPENDENT_AMBULATORY_CARE_PROVIDER_SITE_OTHER): Admitting: Family Medicine

## 2024-02-17 ENCOUNTER — Other Ambulatory Visit: Payer: Self-pay

## 2024-02-17 VITALS — Ht 67.0 in

## 2024-02-17 DIAGNOSIS — M25552 Pain in left hip: Secondary | ICD-10-CM

## 2024-02-17 DIAGNOSIS — M5442 Lumbago with sciatica, left side: Secondary | ICD-10-CM

## 2024-02-17 DIAGNOSIS — G8929 Other chronic pain: Secondary | ICD-10-CM | POA: Diagnosis not present

## 2024-02-17 DIAGNOSIS — M545 Low back pain, unspecified: Secondary | ICD-10-CM

## 2024-02-17 MED ORDER — GABAPENTIN 100 MG PO CAPS: 100.0000 mg | ORAL_CAPSULE | Freq: Three times a day (TID) | ORAL | 2 refills | Status: AC | PRN

## 2024-02-17 NOTE — Patient Instructions (Addendum)
 Thank you for coming in today.   Work note provided to return today with no restrictions.   RX sent in for Gabapentin 100 mg to take 100-300 mg three times daily as needed.   Please get an Xray today before you leave   A referral for physical therapy has been submitted. A representative from the physical therapy office will contact you to coordinate scheduling after confirming your benefits with your insurance provider. If you do not hear from the physical therapy office within the next 1-2 weeks, please let us  know.   See you back in 6 weeks

## 2024-02-21 ENCOUNTER — Ambulatory Visit: Payer: Self-pay | Admitting: Family Medicine

## 2024-02-21 NOTE — Progress Notes (Signed)
 Low back x-ray shows mild arthritis a little worse than it was in 2023

## 2024-02-25 ENCOUNTER — Other Ambulatory Visit: Payer: Self-pay | Admitting: Family Medicine

## 2024-02-29 ENCOUNTER — Other Ambulatory Visit: Payer: Self-pay | Admitting: Family Medicine

## 2024-02-29 DIAGNOSIS — E103292 Type 1 diabetes mellitus with mild nonproliferative diabetic retinopathy without macular edema, left eye: Secondary | ICD-10-CM

## 2024-03-11 ENCOUNTER — Other Ambulatory Visit: Payer: Self-pay | Admitting: Family Medicine

## 2024-04-04 ENCOUNTER — Ambulatory Visit: Admitting: Family Medicine

## 2024-04-05 ENCOUNTER — Other Ambulatory Visit: Payer: Self-pay | Admitting: Family

## 2024-04-15 ENCOUNTER — Other Ambulatory Visit: Payer: Self-pay | Admitting: Family Medicine

## 2024-05-06 ENCOUNTER — Other Ambulatory Visit: Payer: Self-pay | Admitting: Family Medicine

## 2024-05-06 ENCOUNTER — Encounter: Payer: Self-pay | Admitting: Student

## 2024-05-06 DIAGNOSIS — E103292 Type 1 diabetes mellitus with mild nonproliferative diabetic retinopathy without macular edema, left eye: Secondary | ICD-10-CM

## 2024-05-08 MED ORDER — NOVOLOG 100 UNIT/ML IJ SOLN: 15.0000 [IU] | Freq: Four times a day (QID) | INTRAMUSCULAR | 5 refills | Status: AC | PRN

## 2024-05-08 NOTE — Telephone Encounter (Signed)
 Initial Comment Caller states he ran out of his insulin  yesterday. no sx CBWN he is still wainting and needs prescription for today before pharmacy closes. Translation No Disp. Time Titus Time) Disposition Final User 05/07/2024 1:29:42 PM Send To Call Back Waiting For Nurse Celine Males 05/07/2024 1:34:07 PM Send To Clinical Follow Up Vallery Cheek, RN, [place] 05/07/2024 1:40:51 PM Attempt made - message left Marylen, RN, [place] 05/07/2024 1:52:04 PM FINAL ATTEMPT MADE - message left Yes Marylen, RN, [place] 05/07/2024 1:52:11 PM Send to RN Final Attempt Vallery Marylen, RN, [place] Final Disposition 05/07/2024 1:52:04 PM FINAL ATTEMPT MADE - message left Yes Marylen, RN, [place]

## 2024-05-10 ENCOUNTER — Ambulatory Visit: Admitting: Student

## 2024-05-10 ENCOUNTER — Encounter: Payer: Self-pay | Admitting: Student

## 2024-05-10 VITALS — BP 138/76 | HR 59 | Temp 97.9°F | Resp 16 | Ht 67.0 in | Wt 204.8 lb

## 2024-05-10 DIAGNOSIS — Z0000 Encounter for general adult medical examination without abnormal findings: Secondary | ICD-10-CM | POA: Insufficient documentation

## 2024-05-10 DIAGNOSIS — E103292 Type 1 diabetes mellitus with mild nonproliferative diabetic retinopathy without macular edema, left eye: Secondary | ICD-10-CM

## 2024-05-10 DIAGNOSIS — I10 Essential (primary) hypertension: Secondary | ICD-10-CM

## 2024-05-10 DIAGNOSIS — F418 Other specified anxiety disorders: Secondary | ICD-10-CM

## 2024-05-10 DIAGNOSIS — J45909 Unspecified asthma, uncomplicated: Secondary | ICD-10-CM

## 2024-05-10 DIAGNOSIS — G8929 Other chronic pain: Secondary | ICD-10-CM | POA: Insufficient documentation

## 2024-05-10 DIAGNOSIS — G6289 Other specified polyneuropathies: Secondary | ICD-10-CM

## 2024-05-10 NOTE — Assessment & Plan Note (Addendum)
 Type 1 DM-Managed with Insulin  glargine, Novolog  On Arb On statin Refer to endocrinology for management FU 3 months

## 2024-05-10 NOTE — Patient Instructions (Addendum)
 [facility]- Physical Therapy  [address] [place], [place] [address] [phone]

## 2024-05-10 NOTE — Assessment & Plan Note (Signed)
 Well controlled, no changes to meds. Encouraged heart healthy diet such as the DASH diet and exercise as tolerated.

## 2024-05-10 NOTE — Assessment & Plan Note (Addendum)
 Patient is currently followed by Sports Medicine and has been using gabapentin  and tizanidine  prn for pain control. He was referred to physical therapy in November by Sports Medicine but has not yet initiated therapy. Contact information for the PT referral was provided in the AVS. He was encouraged to follow up with PT referral and  Sports Medicine. Transition of PCP and ongoing pain management were discussed. If pain persists or worsens, consider referral to Pain Management.

## 2024-05-10 NOTE — Assessment & Plan Note (Signed)
 Managed with Sertraline  50 mg daily. Doing well, continue same.

## 2024-05-10 NOTE — Assessment & Plan Note (Signed)
 Patient encouraged to maintain heart healthy diet, regular exercise, adequate sleep. Consider daily probiotics. Take medications as prescribed

## 2024-05-10 NOTE — Assessment & Plan Note (Signed)
 Denies recent exacerbation.

## 2024-05-11 ENCOUNTER — Ambulatory Visit: Payer: Self-pay | Admitting: Student

## 2024-05-11 LAB — HEMOGLOBIN A1C: Hgb A1c MFr Bld: 7.3 % — ABNORMAL HIGH (ref 4.6–6.5)

## 2024-08-09 ENCOUNTER — Ambulatory Visit: Admitting: Student
